# Patient Record
Sex: Female | Born: 1955 | Race: Asian | Hispanic: Refuse to answer | Marital: Married | Smoking: Never smoker
Health system: Southern US, Community
[De-identification: ages and names within clinical notes are randomized; demographics above are authoritative.]

## PROBLEM LIST (undated history)

## (undated) DIAGNOSIS — E119 Type 2 diabetes mellitus without complications: Secondary | ICD-10-CM

## (undated) HISTORY — PX: COLONOSCOPY: SHX174

## (undated) HISTORY — PX: CATARACT EXTRACTION: SUR2

## (undated) HISTORY — PX: TRIGGER FINGER RELEASE: SHX641

## (undated) HISTORY — DX: Type 2 diabetes mellitus without complications: E11.9

---

## 2019-10-28 LAB — HM COLONOSCOPY

## 2020-11-26 LAB — HEMOGLOBIN A1C: Hemoglobin A1C: 7.9

## 2020-12-17 ENCOUNTER — Other Ambulatory Visit: Payer: Self-pay | Admitting: Internal Medicine

## 2020-12-17 ENCOUNTER — Ambulatory Visit: Payer: BC Managed Care – PPO | Admitting: Internal Medicine

## 2020-12-17 ENCOUNTER — Ambulatory Visit (INDEPENDENT_AMBULATORY_CARE_PROVIDER_SITE_OTHER): Payer: BC Managed Care – PPO

## 2020-12-17 ENCOUNTER — Other Ambulatory Visit: Payer: Self-pay

## 2020-12-17 ENCOUNTER — Encounter: Payer: Self-pay | Admitting: Internal Medicine

## 2020-12-17 VITALS — BP 158/70 | HR 91 | Temp 99.4°F | Ht 63.25 in | Wt 153.0 lb

## 2020-12-17 DIAGNOSIS — R059 Cough, unspecified: Secondary | ICD-10-CM

## 2020-12-17 DIAGNOSIS — R0602 Shortness of breath: Secondary | ICD-10-CM | POA: Diagnosis not present

## 2020-12-17 DIAGNOSIS — E119 Type 2 diabetes mellitus without complications: Secondary | ICD-10-CM | POA: Diagnosis not present

## 2020-12-17 DIAGNOSIS — E118 Type 2 diabetes mellitus with unspecified complications: Secondary | ICD-10-CM | POA: Diagnosis not present

## 2020-12-17 DIAGNOSIS — Z794 Long term (current) use of insulin: Secondary | ICD-10-CM

## 2020-12-17 DIAGNOSIS — I10 Essential (primary) hypertension: Secondary | ICD-10-CM | POA: Diagnosis not present

## 2020-12-17 MED ORDER — OLMESARTAN MEDOXOMIL 40 MG PO TABS: 40.0000 mg | ORAL_TABLET | Freq: Every day | ORAL | 1 refills | Status: DC

## 2020-12-17 MED ORDER — ONETOUCH VERIO VI STRP: 1.0000 | ORAL_STRIP | Freq: Three times a day (TID) | 1 refills | Status: DC

## 2020-12-17 MED ORDER — INSULIN LISPRO (1 UNIT DIAL) 100 UNIT/ML (KWIKPEN): 8.0000 [IU] | PEN_INJECTOR | Freq: Three times a day (TID) | SUBCUTANEOUS | 1 refills | Status: DC

## 2020-12-17 MED ORDER — METFORMIN HCL ER 500 MG PO TB24: 1000.0000 mg | ORAL_TABLET | Freq: Every day | ORAL | 1 refills | Status: DC

## 2020-12-17 MED ORDER — DEXCOM G6 SENSOR MISC: 1.0000 | Freq: Every day | 5 refills | Status: DC

## 2020-12-17 MED ORDER — DEXCOM G6 TRANSMITTER MISC: 1.0000 | Freq: Every day | 5 refills | Status: DC

## 2020-12-17 MED ORDER — DEXCOM G6 RECEIVER DEVI: 1.0000 | Freq: Every day | 5 refills | Status: DC

## 2020-12-17 NOTE — Patient Instructions (Signed)

## 2020-12-17 NOTE — Progress Notes (Addendum)
Subjective:  Patient ID: Catherine Murphy, female    DOB: [birth year]  Age: 65 y.o. MRN: [id number]  CC: New Patient (Initial Visit), Hypertension, and Diabetes  This visit occurred during the SARS-CoV-2 public health emergency.  Safety protocols were in place, including screening questions prior to the visit, additional usage of staff PPE, and extensive cleaning of exam room while observing appropriate contact time as indicated for disinfecting solutions.    HPI Catherine Murphy presents for f/up and to establish.  She recently moved from [facility] to [place].  She has no records with her yet.  She has a history of DM2 and is insulin-dependent.  She wants to see an endocrinologist to see if she could start using an insulin pump.  She denies polys.  She is active and denies any recent episodes of chest pain, shortness of breath, palpitations, edema, or fatigue.  She complains of a 2-week history of nonproductive cough.  The cough is improving.  She denies shortness of breath, wheezing, fever, chills, or night sweats.  History Catherine Murphy has a past medical history of Diabetes mellitus without complication ([place]).   She has a past surgical history that includes Cataract extraction (Right); Colonoscopy; and Trigger finger release.   Her family history includes Cancer in her father and mother; Glaucoma in her father; Ovarian cancer in her mother.She reports that she has never smoked. She has never used smokeless tobacco. She reports previous alcohol use. She reports that she does not use drugs.  Outpatient Medications Prior to Visit  Medication Sig Dispense Refill  . azelastine (ASTELIN) 0.1 % nasal spray Place 2 sprays into both nostrils as needed for rhinitis. Use in each nostril as directed    . Blood Glucose Monitoring Suppl (ONETOUCH VERIO) w/Device KIT by Does not apply route.    . insulin glargine (LANTUS SOLOSTAR) 100 UNIT/ML Solostar Pen Inject 18 Units into the skin daily.    . Insulin  Pen Needle (B-D UF III MINI PEN NEEDLES) 31G X 5 MM MISC by Does not apply route.    Catherine Murphy Delica Lancets 62E MISC by Does not apply route.    . pioglitazone (ACTOS) 15 MG tablet Take 15 mg by mouth daily.    . simvastatin (ZOCOR) 10 MG tablet Take 10 mg by mouth at bedtime.    Marland Kitchen glucose blood test strip 1 each by Other route as needed for other. One touch verio test strips    . insulin lispro (HUMALOG KWIKPEN) 100 UNIT/ML KwikPen Inject 6 Units into the skin 3 (three) times daily. 6 units in the morning and 8 units at night    . losartan (COZAAR) 50 MG tablet Take 50 mg by mouth daily.    . metFORMIN (GLUCOPHAGE-XR) 500 MG 24 hr tablet Take 500 mg by mouth daily with breakfast.     No facility-administered medications prior to visit.    ROS Review of Systems  Constitutional: Negative for appetite change, chills, fatigue and fever.  HENT: Negative.  Negative for sore throat and trouble swallowing.   Respiratory: Positive for cough.   Cardiovascular: Negative for chest pain, palpitations and leg swelling.  Gastrointestinal: Negative for abdominal pain, diarrhea and nausea.  Endocrine: Negative.   Genitourinary: Negative.  Negative for difficulty urinating, dysuria and hematuria.  Musculoskeletal: Negative.  Negative for arthralgias and myalgias.  Skin: Negative.  Negative for rash.  Neurological: Negative.  Negative for dizziness, weakness and light-headedness.  Hematological: Negative for adenopathy. Does not bruise/bleed easily.  Psychiatric/Behavioral:  Negative.     Objective:  BP (!) 158/70 (BP Location: Left Arm, Patient Position: Sitting, Cuff Size: Large)   Pulse 91   Temp 99.4 F (37.4 C) (Oral)   Ht 5' 3.25" (1.607 m)   Wt 153 lb (69.4 kg)   SpO2 96%   BMI 26.89 kg/m   Physical Exam Vitals reviewed.  Constitutional:      General: She is not in acute distress.    Appearance: Normal appearance. She is not ill-appearing, toxic-appearing or diaphoretic.  HENT:      Nose: Nose normal.     Mouth/Throat:     Mouth: Mucous membranes are moist.  Eyes:     General: No scleral icterus.    Conjunctiva/sclera: Conjunctivae normal.  Cardiovascular:     Rate and Rhythm: Normal rate and regular rhythm.     Heart sounds: No murmur heard.     Comments: EKG- NSR, 76 bpm No LVH or Q waves No old EKG's for comparison Pulmonary:     Effort: Pulmonary effort is normal.     Breath sounds: No stridor. No wheezing, rhonchi or rales.  Abdominal:     General: Abdomen is flat. Bowel sounds are normal. There is no distension.     Palpations: Abdomen is soft. There is no hepatomegaly, splenomegaly or mass.     Tenderness: There is no abdominal tenderness.  Musculoskeletal:        General: Normal range of motion.     Cervical back: Neck supple.     Right lower leg: No edema.     Left lower leg: No edema.  Lymphadenopathy:     Cervical: No cervical adenopathy.  Skin:    General: Skin is warm and dry.  Neurological:     Mental Status: She is alert.     Lab Results  Component Value Date   HGBA1C 7.9 11/26/2020    DG Chest 2 View  Result Date: 12/18/2020 CLINICAL DATA:  Cough and shortness of breath EXAM: CHEST - 2 VIEW COMPARISON:  None. FINDINGS: Lungs hyperexpanded. Slight scarring left upper lobe. No edema or airspace opacity. Heart size and pulmonary vascularity are normal. No adenopathy. No bone lesions. IMPRESSION: Lungs hyperexpanded. Slight left upper lobe scarring. No edema or airspace opacity. Heart size within normal limits. Electronically Signed   By: Lowella Grip III M.D.   On: 12/18/2020 15:42     Assessment & Plan:   Catherine Murphy was seen today for new patient (initial visit), hypertension and diabetes.  Diagnoses and all orders for this visit:  Hypertension, unspecified type- Her blood pressure is not adequately well controlled.  I recommended that she upgrade to a more potent ARB.  I requested records from [place]. -     EKG 12-Lead -      olmesartan (BENICAR) 40 MG tablet; Take 1 tablet (40 mg total) by mouth daily.  Cough- Her chest x-ray is negative for mass or infiltrate.  This is likely a viral URI.  She will let me know if she develops any new or worsening symptoms. -     DG Chest 2 View; Future  Type II diabetes mellitus with manifestations (HCC) -     Discontinue: insulin lispro (HUMALOG KWIKPEN) 100 UNIT/ML KwikPen; Inject 8 Units into the skin 3 (three) times daily. 6 units in the morning and 8 units at night -     Continuous Blood Gluc Receiver ([place]) DEVI; 1 Act by Does not apply route daily. -  Continuous Blood Gluc Transmit (DEXCOM G6 TRANSMITTER) MISC; 1 Act by Does not apply route daily. -     Continuous Blood Gluc Sensor (DEXCOM G6 SENSOR) MISC; 1 Act by Does not apply route daily. -     metFORMIN (GLUCOPHAGE-XR) 500 MG 24 hr tablet; Take 2 tablets (1,000 mg total) by mouth daily with breakfast. -     olmesartan (BENICAR) 40 MG tablet; Take 1 tablet (40 mg total) by mouth daily. -     insulin lispro (HUMALOG KWIKPEN) 100 UNIT/ML KwikPen; Inject 8 Units into the skin 3 (three) times daily. -     Ambulatory referral to Endocrinology -     glucose blood (ONETOUCH VERIO) test strip; 1 each by Other route in the morning, at noon, and at bedtime. Use as instructed  Type 2 diabetes mellitus without complication, with long-term current use of insulin (HCC) -     Continuous Blood Gluc Receiver ([place]) DEVI; 1 Act by Does not apply route daily. -     Continuous Blood Gluc Transmit (DEXCOM G6 TRANSMITTER) MISC; 1 Act by Does not apply route daily. -     Continuous Blood Gluc Sensor (DEXCOM G6 SENSOR) MISC; 1 Act by Does not apply route daily. -     metFORMIN (GLUCOPHAGE-XR) 500 MG 24 hr tablet; Take 2 tablets (1,000 mg total) by mouth daily with breakfast. -     insulin lispro (HUMALOG KWIKPEN) 100 UNIT/ML KwikPen; Inject 8 Units into the skin 3 (three) times daily. -     Ambulatory  referral to Endocrinology -     glucose blood (ONETOUCH VERIO) test strip; 1 each by Other route in the morning, at noon, and at bedtime. Use as instructed   I have discontinued Perle C. Maheu "Helen"'s insulin lispro, losartan, and glucose blood. I have also changed her metFORMIN and insulin lispro. Additionally, I am having her start on Dexcom G6 Receiver, Dexcom G6 Transmitter, Dexcom G6 Sensor, olmesartan, and OneTouch Verio. Lastly, I am having her maintain her Lantus SoloStar, B-D UF III MINI PEN NEEDLES, simvastatin, pioglitazone, azelastine, OneTouch Delica Lancets 61P, and OneTouch Verio.  Meds ordered this encounter  Medications  . DISCONTD: insulin lispro (HUMALOG KWIKPEN) 100 UNIT/ML KwikPen    Sig: Inject 8 Units into the skin 3 (three) times daily. 6 units in the morning and 8 units at night    Dispense:  15 mL    Refill:  1  . Continuous Blood Gluc Receiver (DEXCOM G6 RECEIVER) DEVI    Sig: 1 Act by Does not apply route daily.    Dispense:  1 each    Refill:  5  . Continuous Blood Gluc Transmit (DEXCOM G6 TRANSMITTER) MISC    Sig: 1 Act by Does not apply route daily.    Dispense:  1 each    Refill:  5  . Continuous Blood Gluc Sensor (DEXCOM G6 SENSOR) MISC    Sig: 1 Act by Does not apply route daily.    Dispense:  1 each    Refill:  5  . metFORMIN (GLUCOPHAGE-XR) 500 MG 24 hr tablet    Sig: Take 2 tablets (1,000 mg total) by mouth daily with breakfast.    Dispense:  180 each    Refill:  1  . olmesartan (BENICAR) 40 MG tablet    Sig: Take 1 tablet (40 mg total) by mouth daily.    Dispense:  90 tablet    Refill:  1  . insulin lispro (HUMALOG KWIKPEN)  100 UNIT/ML KwikPen    Sig: Inject 8 Units into the skin 3 (three) times daily.    Dispense:  15 mL    Refill:  1  . glucose blood (ONETOUCH VERIO) test strip    Sig: 1 each by Other route in the morning, at noon, and at bedtime. Use as instructed    Dispense:  300 strip    Refill:  1     Follow-up: Return in about  3 months (around 03/19/2021).  Scarlette Calico, MD

## 2020-12-20 ENCOUNTER — Encounter: Payer: Self-pay | Admitting: Internal Medicine

## 2020-12-30 ENCOUNTER — Telehealth: Payer: Self-pay | Admitting: Internal Medicine

## 2020-12-30 DIAGNOSIS — E118 Type 2 diabetes mellitus with unspecified complications: Secondary | ICD-10-CM

## 2020-12-30 DIAGNOSIS — Z794 Long term (current) use of insulin: Secondary | ICD-10-CM

## 2020-12-30 DIAGNOSIS — E119 Type 2 diabetes mellitus without complications: Secondary | ICD-10-CM

## 2020-12-30 NOTE — Telephone Encounter (Signed)
[facility] is requesting a new prescription for Continuous Blood Gluc Sensor (DEXCOM G6 SENSOR) MISC They are needing a 3 month supply. Also, they are needing the most recent clinical notes. They said that they sent a fax today. It can be sent to [facility] - [place], [place] - [address]. Please advise   Phone: [phone] Ext: [phone]

## 2020-12-31 MED ORDER — DEXCOM G6 SENSOR MISC: 1.0000 | Freq: Every day | 5 refills | Status: DC

## 2020-12-31 NOTE — Addendum Note (Signed)
Addended by: Darryll Capers on: 12/31/2020 01:11 PM   Modules accepted: Orders

## 2021-01-05 NOTE — Telephone Encounter (Signed)
Most recent note has been faxed

## 2021-01-05 NOTE — Telephone Encounter (Signed)
[facility] called and said that they are requesting the most recent office notes. Please advise    Fax: [phone] Phone: [phone]

## 2021-01-14 DIAGNOSIS — E118 Type 2 diabetes mellitus with unspecified complications: Secondary | ICD-10-CM | POA: Diagnosis not present

## 2021-01-22 ENCOUNTER — Telehealth: Payer: Self-pay | Admitting: Internal Medicine

## 2021-01-22 ENCOUNTER — Other Ambulatory Visit: Payer: Self-pay | Admitting: Internal Medicine

## 2021-01-22 DIAGNOSIS — R059 Cough, unspecified: Secondary | ICD-10-CM

## 2021-01-22 NOTE — Telephone Encounter (Signed)
Pt has been informed and expressed understanding.  

## 2021-01-22 NOTE — Telephone Encounter (Signed)
    Patient called and said that she is still coughing. She said that she is taking Mucinex DM and it is not helping. She said that it is her allergies. She was wondering what she could do. She can be reached at [phone]. Please advise

## 2021-01-27 ENCOUNTER — Other Ambulatory Visit: Payer: Self-pay | Admitting: Internal Medicine

## 2021-01-27 DIAGNOSIS — E118 Type 2 diabetes mellitus with unspecified complications: Secondary | ICD-10-CM

## 2021-01-27 DIAGNOSIS — E119 Type 2 diabetes mellitus without complications: Secondary | ICD-10-CM

## 2021-01-27 MED ORDER — DEXCOM G6 SENSOR MISC: 1.0000 | Freq: Every day | 1 refills | Status: DC

## 2021-01-27 MED ORDER — DEXCOM G6 TRANSMITTER MISC: 1.0000 | Freq: Every day | 1 refills | Status: DC

## 2021-01-27 MED ORDER — DEXCOM G6 RECEIVER DEVI: 2 refills | Status: DC

## 2021-01-27 NOTE — Telephone Encounter (Signed)
   Patient called and is requesting 180 day supply not 90. Please advise

## 2021-01-27 NOTE — Telephone Encounter (Signed)
   Patients husband called and is requesting a 90 day supply for Continuous Blood Gluc Sensor (DEXCOM G6 SENSOR) MISC and Continuous Blood Gluc Transmit (DEXCOM G6 TRANSMITTER) MISC and be sent to [facility] - [place], [place] - [address]. Please advise.

## 2021-02-02 DIAGNOSIS — E118 Type 2 diabetes mellitus with unspecified complications: Secondary | ICD-10-CM | POA: Diagnosis not present

## 2021-02-19 DIAGNOSIS — E118 Type 2 diabetes mellitus with unspecified complications: Secondary | ICD-10-CM | POA: Diagnosis not present

## 2021-03-03 ENCOUNTER — Encounter: Payer: Self-pay | Admitting: Pulmonary Disease

## 2021-03-03 ENCOUNTER — Other Ambulatory Visit: Payer: Self-pay

## 2021-03-03 ENCOUNTER — Ambulatory Visit: Payer: BC Managed Care – PPO | Admitting: Pulmonary Disease

## 2021-03-03 VITALS — BP 128/74 | HR 73 | Ht 63.5 in | Wt 152.0 lb

## 2021-03-03 DIAGNOSIS — R059 Cough, unspecified: Secondary | ICD-10-CM

## 2021-03-03 DIAGNOSIS — J4520 Mild intermittent asthma, uncomplicated: Secondary | ICD-10-CM | POA: Diagnosis not present

## 2021-03-03 MED ORDER — ANORO ELLIPTA 62.5-25 MCG/INH IN AEPB: 1.0000 | INHALATION_SPRAY | Freq: Every day | RESPIRATORY_TRACT | 0 refills | Status: DC

## 2021-03-03 MED ORDER — ANORO ELLIPTA 62.5-25 MCG/INH IN AEPB: 1.0000 | INHALATION_SPRAY | Freq: Every day | RESPIRATORY_TRACT | 6 refills | Status: DC | PRN

## 2021-03-03 MED ORDER — ALBUTEROL SULFATE HFA 108 (90 BASE) MCG/ACT IN AERS
2.0000 | INHALATION_SPRAY | Freq: Four times a day (QID) | RESPIRATORY_TRACT | 6 refills | Status: DC | PRN
Start: 2021-03-03 — End: 2022-04-28

## 2021-03-03 NOTE — Progress Notes (Signed)
Patient seen in the office today and instructed on use of Anoro.  Patient expressed understanding and demonstrated technique.  Emelia Loron [facility] 03/03/21

## 2021-03-03 NOTE — Patient Instructions (Addendum)
Use anoro ellipta 1 puf daily as needed for cough, wheezing, shortness of breath  Use albuterol inhaler 1-2 puffs every 4-6 hours as needed for cough, shortness of breath, wheezing or chest tightness  You can use astelin nasal spray as needed for nasal congestion and runny nose due to allergies  You can use over the counter allergy medicine such as Zyrtec or Allegra

## 2021-03-03 NOTE — Progress Notes (Signed)
Synopsis: Referred in August 2022 for cough by Scarlette Calico, MD  Subjective:   PATIENT ID: Catherine Murphy GENDER: female DOB: March 25, 1956, MRN: [id number]   HPI  Chief Complaint  Patient presents with   Consult    Referred by PCP for chronic cough. States she moved here from [place] about 2 months ago. After moving here, she developed a dry cough. Denied any wheezing or SOB associated with cough.     Catherine Murphy is a 65 year old woman, never smoker with history of diabetes mellitus type II who is referred to pulmonary clinic for cough.   She reports developing a cough at the beginning of May due to spring allergies as she reports the cough has resolved and is not bothering her at this time.  She recently moved from [place], [place] where she had no seasonal allergies.  She also reports nasal congestion and possible postnasal drainage when she had the cough.  The cough was worse at night.  The cough is nonproductive.  She did use her husband's Qvar inhaler with improvement of the cough.  She also reports cough symptoms and chest tightness when exposed to cigarette smoke, strong perfumes/colognes or scented candles.  Note reviewed from her primary care physician Dr. Ronnald Ramp on 12/17/20 with chest radiograph showing no opacities or infiltrates at that time. She has had cough since the beginning of May.   She denies any history of smoking and no secondhand smoke exposure. She denies any occupational dust or chemical exposures over the years.  Past Medical History:  Diagnosis Date   Diabetes mellitus without complication ([place])      Family History  Problem Relation Age of Onset   Glaucoma Father    Colon cancer Father    Ovarian cancer Mother      Social History   Socioeconomic History   Marital status: Married    Spouse name: Not on file   Number of children: Not on file   Years of education: Not on file   Highest education level: Not on file  Occupational History   Not on  file  Tobacco Use   Smoking status: Never   Smokeless tobacco: Never  Vaping Use   Vaping Use: Never used  Substance and Sexual Activity   Alcohol use: Not Currently   Drug use: Never   Sexual activity: Not on file  Other Topics Concern   Not on file  Social History Narrative   Not on file   Social Determinants of Health   Financial Resource Strain: Not on file  Food Insecurity: Not on file  Transportation Needs: Not on file  Physical Activity: Not on file  Stress: Not on file  Social Connections: Not on file  Intimate Partner Violence: Not on file     No Known Allergies   Outpatient Medications Prior to Visit  Medication Sig Dispense Refill   azelastine (ASTELIN) 0.1 % nasal spray Place 2 sprays into both nostrils as needed for rhinitis. Use in each nostril as directed     Blood Glucose Monitoring Suppl (ONETOUCH VERIO) w/Device KIT by Does not apply route.     Continuous Blood Gluc Receiver (DEXCOM G6 RECEIVER) DEVI 2 PER YEAR 3 each 2   Continuous Blood Gluc Sensor (DEXCOM G6 SENSOR) MISC 1 Act by Does not apply route daily. 3 each 1   Continuous Blood Gluc Transmit (DEXCOM G6 TRANSMITTER) MISC 1 Act by Does not apply route daily. 3 each 1   glucose blood (  ONETOUCH VERIO) test strip 1 each by Other route in the morning, at noon, and at bedtime. Use as instructed 300 strip 1   insulin glargine (LANTUS SOLOSTAR) 100 UNIT/ML Solostar Pen Inject 18 Units into the skin daily.     insulin lispro (HUMALOG KWIKPEN) 100 UNIT/ML KwikPen Inject 8 Units into the skin 3 (three) times daily. 15 mL 1   Insulin Pen Needle (B-D UF III MINI PEN NEEDLES) 31G X 5 MM MISC by Does not apply route.     metFORMIN (GLUCOPHAGE-XR) 500 MG 24 hr tablet Take 2 tablets (1,000 mg total) by mouth daily with breakfast. 180 each 1   olmesartan (BENICAR) 40 MG tablet Take 1 tablet (40 mg total) by mouth daily. 90 tablet 1   OneTouch Delica Lancets 62I MISC by Does not apply route.     pioglitazone (ACTOS)  15 MG tablet Take 15 mg by mouth daily.     simvastatin (ZOCOR) 10 MG tablet Take 10 mg by mouth at bedtime.     No facility-administered medications prior to visit.    Review of Systems  Constitutional:  Negative for chills, fever, malaise/fatigue and weight loss.  HENT:  Positive for congestion. Negative for sinus pain and sore throat.   Eyes: Negative.   Respiratory:  Positive for cough and shortness of breath. Negative for hemoptysis, sputum production and wheezing.   Cardiovascular:  Negative for chest pain, palpitations, orthopnea, claudication and leg swelling.  Gastrointestinal:  Negative for abdominal pain, heartburn, nausea and vomiting.  Genitourinary: Negative.   Musculoskeletal:  Negative for joint pain and myalgias.  Skin:  Negative for rash.  Neurological:  Negative for weakness.  Endo/Heme/Allergies: Negative.   Psychiatric/Behavioral: Negative.     Objective:   Vitals:   03/03/21 1332  BP: 128/74  Pulse: 73  SpO2: 97%  Weight: 152 lb (68.9 kg)  Height: 5' 3.5" (1.613 m)    Physical Exam Constitutional:      General: She is not in acute distress.    Appearance: She is not ill-appearing.  HENT:     Head: Normocephalic and atraumatic.  Eyes:     General: No scleral icterus.    Conjunctiva/sclera: Conjunctivae normal.     Pupils: Pupils are equal, round, and reactive to light.  Cardiovascular:     Rate and Rhythm: Normal rate and regular rhythm.     Pulses: Normal pulses.     Heart sounds: Normal heart sounds. No murmur heard. Pulmonary:     Effort: Pulmonary effort is normal.     Breath sounds: Normal breath sounds. No wheezing, rhonchi or rales.  Abdominal:     General: Bowel sounds are normal.     Palpations: Abdomen is soft.  Musculoskeletal:     Right lower leg: No edema.     Left lower leg: No edema.  Lymphadenopathy:     Cervical: No cervical adenopathy.  Skin:    General: Skin is warm and dry.  Neurological:     General: No focal deficit  present.     Mental Status: She is alert.  Psychiatric:        Mood and Affect: Mood normal.        Behavior: Behavior normal.        Thought Content: Thought content normal.        Judgment: Judgment normal.    CBC No results found for: WBC, RBC, HGB, HCT, PLT, MCV, MCH, MCHC, RDW, LYMPHSABS, MONOABS, EOSABS, BASOSABS   Chest imaging: CXR  12/17/20 Lungs hyperexpanded. Slight scarring left upper lobe. No edema or airspace opacity. Heart size and pulmonary vascularity are normal. No adenopathy. No bone lesions  PFT: No flowsheet data found.    Assessment & Plan:   Cough  Mild intermittent asthma without complication - Plan: umeclidinium-vilanterol (ANORO ELLIPTA) 62.5-25 MCG/INH AEPB, albuterol (VENTOLIN HFA) 108 (90 Base) MCG/ACT inhaler  Discussion: Catherine Murphy is a 65 year old woman, never smoker with history of diabetes mellitus type II who is referred to pulmonary clinic for cough.   She appears to have mild intermittent asthma with the following aggravating factors which include spring and fall allergens, cigarette smoke and strong scents such as colognes/perfumes.  Given her history of glaucoma we will avoid inhaled corticosteroid therapy.  She is to use an anoro Ellipta 1 puff daily as needed for cough, wheezing, chest tightness or shortness of breath. She will likely need to use this inhaler scheduled during the spring and fall months with as needed albuterol.   She can start over-the-counter allergy medicine such as Zyrtec or Allegra for her seasonal allergies.  She can continue Astelin nasal spray as needed for nasal congestion.  Again we will avoid fluticasone nasal spray given her history of glaucoma.  If she develops progressive symptoms we will then move forward with pulmonary function testing and imaging if needed.   Follow up in 6 months.  Freda Jackson, MD [facility] Office: [phone]   Current Outpatient Medications:     albuterol (VENTOLIN HFA) 108 (90 Base) MCG/ACT inhaler, Inhale 2 puffs into the lungs every 6 (six) hours as needed for wheezing or shortness of breath., Disp: 8 g, Rfl: 6   azelastine (ASTELIN) 0.1 % nasal spray, Place 2 sprays into both nostrils as needed for rhinitis. Use in each nostril as directed, Disp: , Rfl:    Blood Glucose Monitoring Suppl (ONETOUCH VERIO) w/Device KIT, by Does not apply route., Disp: , Rfl:    Continuous Blood Gluc Receiver (DEXCOM G6 RECEIVER) DEVI, 2 PER YEAR, Disp: 3 each, Rfl: 2   Continuous Blood Gluc Sensor (DEXCOM G6 SENSOR) MISC, 1 Act by Does not apply route daily., Disp: 3 each, Rfl: 1   Continuous Blood Gluc Transmit (DEXCOM G6 TRANSMITTER) MISC, 1 Act by Does not apply route daily., Disp: 3 each, Rfl: 1   glucose blood (ONETOUCH VERIO) test strip, 1 each by Other route in the morning, at noon, and at bedtime. Use as instructed, Disp: 300 strip, Rfl: 1   insulin glargine (LANTUS SOLOSTAR) 100 UNIT/ML Solostar Pen, Inject 18 Units into the skin daily., Disp: , Rfl:    insulin lispro (HUMALOG KWIKPEN) 100 UNIT/ML KwikPen, Inject 8 Units into the skin 3 (three) times daily., Disp: 15 mL, Rfl: 1   Insulin Pen Needle (B-D UF III MINI PEN NEEDLES) 31G X 5 MM MISC, by Does not apply route., Disp: , Rfl:    metFORMIN (GLUCOPHAGE-XR) 500 MG 24 hr tablet, Take 2 tablets (1,000 mg total) by mouth daily with breakfast., Disp: 180 each, Rfl: 1   olmesartan (BENICAR) 40 MG tablet, Take 1 tablet (40 mg total) by mouth daily., Disp: 90 tablet, Rfl: 1   OneTouch Delica Lancets 32K MISC, by Does not apply route., Disp: , Rfl:    pioglitazone (ACTOS) 15 MG tablet, Take 15 mg by mouth daily., Disp: , Rfl:    simvastatin (ZOCOR) 10 MG tablet, Take 10 mg by mouth at bedtime., Disp: , Rfl:    umeclidinium-vilanterol (ANORO  ELLIPTA) 62.5-25 MCG/INH AEPB, Inhale 1 puff into the lungs daily as needed., Disp: 60 each, Rfl: 6   umeclidinium-vilanterol (ANORO ELLIPTA) 62.5-25 MCG/INH AEPB,  Inhale 1 puff into the lungs daily., Disp: 1 each, Rfl: 0

## 2021-03-09 ENCOUNTER — Telehealth: Payer: Self-pay | Admitting: Internal Medicine

## 2021-03-09 ENCOUNTER — Other Ambulatory Visit: Payer: Self-pay | Admitting: Internal Medicine

## 2021-03-09 DIAGNOSIS — Z1231 Encounter for screening mammogram for malignant neoplasm of breast: Secondary | ICD-10-CM

## 2021-03-09 NOTE — Telephone Encounter (Signed)
Patient is requesting referral for routine mammogram  Callback [phone]

## 2021-03-23 ENCOUNTER — Other Ambulatory Visit: Payer: Self-pay

## 2021-03-23 ENCOUNTER — Ambulatory Visit: Payer: BC Managed Care – PPO | Admitting: Internal Medicine

## 2021-03-23 ENCOUNTER — Encounter: Payer: Self-pay | Admitting: Endocrinology

## 2021-03-23 ENCOUNTER — Ambulatory Visit: Payer: BC Managed Care – PPO | Admitting: Endocrinology

## 2021-03-23 VITALS — BP 156/64 | HR 78 | Ht 63.5 in | Wt 157.4 lb

## 2021-03-23 DIAGNOSIS — E1165 Type 2 diabetes mellitus with hyperglycemia: Secondary | ICD-10-CM

## 2021-03-23 DIAGNOSIS — E118 Type 2 diabetes mellitus with unspecified complications: Secondary | ICD-10-CM

## 2021-03-23 DIAGNOSIS — Z794 Long term (current) use of insulin: Secondary | ICD-10-CM | POA: Diagnosis not present

## 2021-03-23 DIAGNOSIS — E119 Type 2 diabetes mellitus without complications: Secondary | ICD-10-CM

## 2021-03-23 LAB — POCT GLYCOSYLATED HEMOGLOBIN (HGB A1C): Hemoglobin A1C: 7.5 % — AB (ref 4.0–5.6)

## 2021-03-23 MED ORDER — LANTUS SOLOSTAR 100 UNIT/ML ~~LOC~~ SOPN: 14.0000 [IU] | PEN_INJECTOR | Freq: Every day | SUBCUTANEOUS | 3 refills | Status: DC

## 2021-03-23 MED ORDER — INSULIN LISPRO (1 UNIT DIAL) 100 UNIT/ML (KWIKPEN): PEN_INJECTOR | SUBCUTANEOUS | 1 refills | Status: DC

## 2021-03-23 NOTE — Patient Instructions (Addendum)
Your blood pressure is high today.  Please see your primary care provider soon, to have it rechecked good diet and exercise significantly improve the control of your diabetes.  please let me know if you wish to be referred to a dietician.  high blood sugar is very risky to your health.  you should see an eye doctor and dentist every year.  It is very important to get all recommended vaccinations.  Controlling your blood pressure and cholesterol drastically reduces the damage diabetes does to your body.  Those who smoke should quit.  Please discuss these with your doctor.  check your blood sugar twice a day.  vary the time of day when you check, between before the 3 meals, and at bedtime.  also check if you have symptoms of your blood sugar being too high or too low.  please keep a record of the readings and bring it to your next appointment here (or you can bring the meter itself).  You can write it on any piece of paper.  please call us sooner if your blood sugar goes below 70, or if most of your readings are over 200.   Please change the insulins to the numbers listed below. Please see a diabetes education specialist, to consider a pump.  Please come back for a follow-up appointment in 2 months.    ????????????????????????????? ????????????????????????????????????????????????????????????????????????????????? ????????????????????????????????????????????????? ?????????????????????????????????????????????????????????????????????????????????????????????????????? 70????????????? 200????????? ????????????? ????????????????? ?? 2 ????????????

## 2021-03-23 NOTE — Progress Notes (Signed)
Subjective:    Patient ID: Catherine Murphy, female    DOB: 06-25-1956, 65 y.o.   MRN: [id number]  HPI pt is referred by Dr Ronnald Ramp, for diabetes.  Pt states DM was dx'ed in 1993 (she had GDM in 1991); she is unaware of any chronic complications; she has been on insulin since dx; pt says her diet and exercise are good; she has never had pancreatitis, pancreatic surgery, severe hypoglycemia or DKA.  She takes Lantus, 15 units QHS, and Novolog 3 times a day (just before each meal), 6-8-8 units.  She has mild hypoglycemia QOD, in the middle of the night, despite the reduction of Lantus.  I reviewed continuous glucose monitor data.  Glucose varies from 80-330.  It is in general highest at [place].  Otherwise, There is no trend throughout the day. Past Medical History:  Diagnosis Date   Diabetes mellitus without complication ([place])     Past Surgical History:  Procedure Laterality Date   CATARACT EXTRACTION Right    COLONOSCOPY     TRIGGER FINGER RELEASE      Social History   Socioeconomic History   Marital status: Married    Spouse name: Not on file   Number of children: Not on file   Years of education: Not on file   Highest education level: Not on file  Occupational History   Not on file  Tobacco Use   Smoking status: Never   Smokeless tobacco: Never  Vaping Use   Vaping Use: Never used  Substance and Sexual Activity   Alcohol use: Not Currently   Drug use: Never   Sexual activity: Not on file  Other Topics Concern   Not on file  Social History Narrative   Not on file   Social Determinants of Health   Financial Resource Strain: Not on file  Food Insecurity: Not on file  Transportation Needs: Not on file  Physical Activity: Not on file  Stress: Not on file  Social Connections: Not on file  Intimate Partner Violence: Not on file    Current Outpatient Medications on File Prior to Visit  Medication Sig Dispense Refill   albuterol (VENTOLIN HFA) 108 (90 Base) MCG/ACT inhaler  Inhale 2 puffs into the lungs every 6 (six) hours as needed for wheezing or shortness of breath. 8 g 6   azelastine (ASTELIN) 0.1 % nasal spray Place 2 sprays into both nostrils as needed for rhinitis. Use in each nostril as directed     Blood Glucose Monitoring Suppl (ONETOUCH VERIO) w/Device KIT by Does not apply route.     Continuous Blood Gluc Receiver (DEXCOM G6 RECEIVER) DEVI 2 PER YEAR 3 each 2   Continuous Blood Gluc Sensor (DEXCOM G6 SENSOR) MISC 1 Act by Does not apply route daily. 3 each 1   Continuous Blood Gluc Transmit (DEXCOM G6 TRANSMITTER) MISC 1 Act by Does not apply route daily. 3 each 1   glucose blood (ONETOUCH VERIO) test strip 1 each by Other route in the morning, at noon, and at bedtime. Use as instructed 300 strip 1   Insulin Pen Needle (B-D UF III MINI PEN NEEDLES) 31G X 5 MM MISC by Does not apply route.     metFORMIN (GLUCOPHAGE-XR) 500 MG 24 hr tablet Take 2 tablets (1,000 mg total) by mouth daily with breakfast. 180 each 1   olmesartan (BENICAR) 40 MG tablet Take 1 tablet (40 mg total) by mouth daily. 90 tablet 1   OneTouch Delica Lancets 24M MISC  by Does not apply route.     pioglitazone (ACTOS) 15 MG tablet Take 15 mg by mouth daily.     simvastatin (ZOCOR) 10 MG tablet Take 10 mg by mouth at bedtime.     umeclidinium-vilanterol (ANORO ELLIPTA) 62.5-25 MCG/INH AEPB Inhale 1 puff into the lungs daily as needed. 60 each 6   umeclidinium-vilanterol (ANORO ELLIPTA) 62.5-25 MCG/INH AEPB Inhale 1 puff into the lungs daily. 1 each 0   No current facility-administered medications on file prior to visit.    No Known Allergies  Family History  Problem Relation Age of Onset   Ovarian cancer Mother    Glaucoma Father    Colon cancer Father    Diabetes Neg Hx     BP (!) 156/64 (BP Location: Left Arm, Patient Position: Sitting, Cuff Size: Large)   Pulse 78   Ht 5' 3.5" (1.613 m)   Wt 157 lb 6.4 oz (71.4 kg)   SpO2 96%   BMI 27.44 kg/m     Review of  Systems denies sob, n/v, and depression.  She has weight gain.      Objective:   Physical Exam Pulses: dorsalis pedis intact bilat.   MSK: no deformity of the feet CV: no leg edema Skin:  no ulcer on the feet.  normal color and temp on the feet. Neuro: sensation is intact to touch on the feet.    A1c=7.5%  I have reviewed outside records, and summarized: Pt was noted to have elevated A1c, and referred here.  Pump rx was considered.     Assessment & Plan:  Insulin-requiring type 2 DM: uncontrolled   Patient Instructions  Your blood pressure is high today.  Please see your primary care provider soon, to have it rechecked good diet and exercise significantly improve the control of your diabetes.  please let me know if you wish to be referred to a dietician.  high blood sugar is very risky to your health.  you should see an eye doctor and dentist every year.  It is very important to get all recommended vaccinations.  Controlling your blood pressure and cholesterol drastically reduces the damage diabetes does to your body.  Those who smoke should quit.  Please discuss these with your doctor.  check your blood sugar twice a day.  vary the time of day when you check, between before the 3 meals, and at bedtime.  also check if you have symptoms of your blood sugar being too high or too low.  please keep a record of the readings and bring it to your next appointment here (or you can bring the meter itself).  You can write it on any piece of paper.  please call us sooner if your blood sugar goes below 70, or if most of your readings are over 200.   Please change the insulins to the numbers listed below. Please see a diabetes education specialist, to consider a pump.  Please come back for a follow-up appointment in 2 months.     ????????????????????????????? ????????????????????????????????????????????????????????????????????????????????? ????????????????????????????????????????????????? ?????????????????????????????????????????????????????????????????????????????????????????????????????? 70????????????? 200????????? ????????????? ????????????????? ?? 2 ????????????

## 2021-03-24 ENCOUNTER — Encounter: Payer: Self-pay | Admitting: Internal Medicine

## 2021-03-24 ENCOUNTER — Ambulatory Visit: Payer: BC Managed Care – PPO | Admitting: Internal Medicine

## 2021-03-24 VITALS — BP 138/66 | HR 76 | Temp 98.5°F | Resp 16 | Ht 63.5 in | Wt 158.2 lb

## 2021-03-24 DIAGNOSIS — Z0000 Encounter for general adult medical examination without abnormal findings: Secondary | ICD-10-CM | POA: Diagnosis not present

## 2021-03-24 DIAGNOSIS — E785 Hyperlipidemia, unspecified: Secondary | ICD-10-CM | POA: Diagnosis not present

## 2021-03-24 DIAGNOSIS — Z23 Encounter for immunization: Secondary | ICD-10-CM | POA: Diagnosis not present

## 2021-03-24 DIAGNOSIS — Z1231 Encounter for screening mammogram for malignant neoplasm of breast: Secondary | ICD-10-CM | POA: Diagnosis not present

## 2021-03-24 DIAGNOSIS — Z124 Encounter for screening for malignant neoplasm of cervix: Secondary | ICD-10-CM

## 2021-03-24 DIAGNOSIS — Z1159 Encounter for screening for other viral diseases: Secondary | ICD-10-CM | POA: Diagnosis not present

## 2021-03-24 DIAGNOSIS — Z794 Long term (current) use of insulin: Secondary | ICD-10-CM | POA: Insufficient documentation

## 2021-03-24 DIAGNOSIS — E118 Type 2 diabetes mellitus with unspecified complications: Secondary | ICD-10-CM | POA: Diagnosis not present

## 2021-03-24 DIAGNOSIS — Z0001 Encounter for general adult medical examination with abnormal findings: Secondary | ICD-10-CM | POA: Insufficient documentation

## 2021-03-24 DIAGNOSIS — I10 Essential (primary) hypertension: Secondary | ICD-10-CM | POA: Diagnosis not present

## 2021-03-24 LAB — LIPID PANEL
Cholesterol: 185 mg/dL (ref 0–200)
HDL: 75.9 mg/dL (ref >39.00)
LDL Cholesterol: 98 mg/dL (ref 0–99)
NonHDL: 109.59
Total CHOL/HDL Ratio: 2
Triglycerides: 60 mg/dL (ref 0.0–149.0)
VLDL: 12 mg/dL (ref 0.0–40.0)

## 2021-03-24 LAB — BASIC METABOLIC PANEL
BUN: 21 mg/dL (ref 6–23)
CO2: 25 mEq/L (ref 19–32)
Calcium: 9.4 mg/dL (ref 8.4–10.5)
Chloride: 104 mEq/L (ref 96–112)
Creatinine, Ser: 0.67 mg/dL (ref 0.40–1.20)
GFR: 92.83 mL/min (ref >60.00)
Glucose, Bld: 143 mg/dL — ABNORMAL HIGH (ref 70–99)
Potassium: 4.2 mEq/L (ref 3.5–5.1)
Sodium: 138 mEq/L (ref 135–145)

## 2021-03-24 LAB — HEPATIC FUNCTION PANEL
ALT: 14 U/L (ref 0–35)
AST: 22 U/L (ref 0–37)
Albumin: 4 g/dL (ref 3.5–5.2)
Alkaline Phosphatase: 48 U/L (ref 39–117)
Bilirubin, Direct: 0.1 mg/dL (ref 0.0–0.3)
Total Bilirubin: 0.4 mg/dL (ref 0.2–1.2)
Total Protein: 7.4 g/dL (ref 6.0–8.3)

## 2021-03-24 LAB — URINALYSIS, ROUTINE W REFLEX MICROSCOPIC
Bilirubin Urine: NEGATIVE
Hgb urine dipstick: NEGATIVE
Ketones, ur: NEGATIVE
Leukocytes,Ua: NEGATIVE
Nitrite: NEGATIVE
RBC / HPF: NONE SEEN (ref 0–?)
Specific Gravity, Urine: 1.01 (ref 1.000–1.030)
Total Protein, Urine: NEGATIVE
Urine Glucose: NEGATIVE
Urobilinogen, UA: 0.2 (ref 0.0–1.0)
WBC, UA: NONE SEEN (ref 0–?)
pH: 6 (ref 5.0–8.0)

## 2021-03-24 LAB — CBC WITH DIFFERENTIAL/PLATELET
Basophils Absolute: 0 10*3/uL (ref 0.0–0.1)
Basophils Relative: 0.7 % (ref 0.0–3.0)
Eosinophils Absolute: 0.1 10*3/uL (ref 0.0–0.7)
Eosinophils Relative: 2.4 % (ref 0.0–5.0)
HCT: 39.7 % (ref 36.0–46.0)
Hemoglobin: 13 g/dL (ref 12.0–15.0)
Lymphocytes Relative: 24.4 % (ref 12.0–46.0)
Lymphs Abs: 1.2 10*3/uL (ref 0.7–4.0)
MCHC: 32.8 g/dL (ref 30.0–36.0)
MCV: 91.6 fl (ref 78.0–100.0)
Monocytes Absolute: 0.3 10*3/uL (ref 0.1–1.0)
Monocytes Relative: 7.1 % (ref 3.0–12.0)
Neutro Abs: 3.1 10*3/uL (ref 1.4–7.7)
Neutrophils Relative %: 65.4 % (ref 43.0–77.0)
Platelets: 229 10*3/uL (ref 150.0–400.0)
RBC: 4.33 Mil/uL (ref 3.87–5.11)
RDW: 12.9 % (ref 11.5–15.5)
WBC: 4.8 10*3/uL (ref 4.0–10.5)

## 2021-03-24 LAB — MICROALBUMIN / CREATININE URINE RATIO
Creatinine,U: 65.9 mg/dL
Microalb Creat Ratio: 1.1 mg/g (ref 0.0–30.0)
Microalb, Ur: <0.7 mg/dL (ref 0.0–1.9)

## 2021-03-24 LAB — TSH: TSH: 3.66 u[IU]/mL (ref 0.35–5.50)

## 2021-03-24 NOTE — Patient Instructions (Signed)
Health Maintenance, Female Adopting a healthy lifestyle and getting preventive care are important in promoting health and wellness. Ask your health care provider about: The right schedule for you to have regular tests and exams. Things you can do on your own to prevent diseases and keep yourself healthy. What should I know about diet, weight, and exercise? Eat a healthy diet  Eat a diet that includes plenty of vegetables, fruits, low-fat dairy products, and lean protein. Do not eat a lot of foods that are high in solid fats, added sugars, or sodium.  Maintain a healthy weight Body mass index (BMI) is used to identify weight problems. It estimates body fat based on height and weight. Your health care provider can help determineyour BMI and help you achieve or maintain a healthy weight. Get regular exercise Get regular exercise. This is one of the most important things you can do for your health. Most adults should: Exercise for at least 150 minutes each week. The exercise should increase your heart rate and make you sweat (moderate-intensity exercise). Do strengthening exercises at least twice a week. This is in addition to the moderate-intensity exercise. Spend less time sitting. Even light physical activity can be beneficial. Watch cholesterol and blood lipids Have your blood tested for lipids and cholesterol at 64 years of age, then havethis test every 5 years. Have your cholesterol levels checked more often if: Your lipid or cholesterol levels are high. You are older than 64 years of age. You are at high risk for heart disease. What should I know about cancer screening? Depending on your health history and family history, you may need to have cancer screening at various ages. This may include screening for: Breast cancer. Cervical cancer. Colorectal cancer. Skin cancer. Lung cancer. What should I know about heart disease, diabetes, and high blood pressure? Blood pressure and heart  disease High blood pressure causes heart disease and increases the risk of stroke. This is more likely to develop in people who have high blood pressure readings, are of African descent, or are overweight. Have your blood pressure checked: Every 3-5 years if you are 18-39 years of age. Every year if you are 40 years old or older. Diabetes Have regular diabetes screenings. This checks your fasting blood sugar level. Have the screening done: Once every three years after age 40 if you are at a normal weight and have a low risk for diabetes. More often and at a younger age if you are overweight or have a high risk for diabetes. What should I know about preventing infection? Hepatitis B If you have a higher risk for hepatitis B, you should be screened for this virus. Talk with your health care provider to find out if you are at risk forhepatitis B infection. Hepatitis C Testing is recommended for: Everyone born from 1945 through 1965. Anyone with known risk factors for hepatitis C. Sexually transmitted infections (STIs) Get screened for STIs, including gonorrhea and chlamydia, if: You are sexually active and are younger than 64 years of age. You are older than 64 years of age and your health care provider tells you that you are at risk for this type of infection. Your sexual activity has changed since you were last screened, and you are at increased risk for chlamydia or gonorrhea. Ask your health care provider if you are at risk. Ask your health care provider about whether you are at high risk for HIV. Your health care provider may recommend a prescription medicine to help   prevent HIV infection. If you choose to take medicine to prevent HIV, you should first get tested for HIV. You should then be tested every 3 months for as long as you are taking the medicine. Pregnancy If you are about to stop having your period (premenopausal) and you may become pregnant, seek counseling before you get  pregnant. Take 400 to 800 micrograms (mcg) of folic acid every day if you become pregnant. Ask for birth control (contraception) if you want to prevent pregnancy. Osteoporosis and menopause Osteoporosis is a disease in which the bones lose minerals and strength with aging. This can result in bone fractures. If you are 65 years old or older, or if you are at risk for osteoporosis and fractures, ask your health care provider if you should: Be screened for bone loss. Take a calcium or vitamin D supplement to lower your risk of fractures. Be given hormone replacement therapy (HRT) to treat symptoms of menopause. Follow these instructions at home: Lifestyle Do not use any products that contain nicotine or tobacco, such as cigarettes, e-cigarettes, and chewing tobacco. If you need help quitting, ask your health care provider. Do not use street drugs. Do not share needles. Ask your health care provider for help if you need support or information about quitting drugs. Alcohol use Do not drink alcohol if: Your health care provider tells you not to drink. You are pregnant, may be pregnant, or are planning to become pregnant. If you drink alcohol: Limit how much you use to 0-1 drink a day. Limit intake if you are breastfeeding. Be aware of how much alcohol is in your drink. In the [place] one drink equals one 12 oz bottle of beer (355 mL), one 5 oz glass of wine (148 mL), or one 1 oz glass of hard liquor (44 mL). General instructions Schedule regular health, dental, and eye exams. Stay current with your vaccines. Tell your health care provider if: You often feel depressed. You have ever been abused or do not feel safe at home. Summary Adopting a healthy lifestyle and getting preventive care are important in promoting health and wellness. Follow your health care provider's instructions about healthy diet, exercising, and getting tested or screened for diseases. Follow your health care provider's  instructions on monitoring your cholesterol and blood pressure. This information is not intended to replace advice given to you by your health care provider. Make sure you discuss any questions you have with your healthcare provider. Document Revised: 07/11/2018 Document Reviewed: 07/11/2018 Elsevier Patient Education  2022 [employer]  

## 2021-03-24 NOTE — Progress Notes (Signed)
Subjective:  Patient ID: Catherine Murphy, female    DOB: 10/11/1955  Age: 65 y.o. MRN: [id number]  CC: Annual Exam, Diabetes, and Hyperlipidemia  This visit occurred during the SARS-CoV-2 public health emergency.  Safety protocols were in place, including screening questions prior to the visit, additional usage of staff PPE, and extensive cleaning of exam room while observing appropriate contact time as indicated for disinfecting solutions.    HPI Catherine Murphy presents for a CPX and f/up -   Her cough has resolved.  She is active and denies any recent episodes of chest pain, shortness of breath, dyspnea on exertion, diaphoresis, edema, or fatigue.  Outpatient Medications Prior to Visit  Medication Sig Dispense Refill   albuterol (VENTOLIN HFA) 108 (90 Base) MCG/ACT inhaler Inhale 2 puffs into the lungs every 6 (six) hours as needed for wheezing or shortness of breath. 8 g 6   azelastine (ASTELIN) 0.1 % nasal spray Place 2 sprays into both nostrils as needed for rhinitis. Use in each nostril as directed     Blood Glucose Monitoring Suppl (ONETOUCH VERIO) w/Device KIT by Does not apply route.     Continuous Blood Gluc Receiver (DEXCOM G6 RECEIVER) DEVI 2 PER YEAR 3 each 2   Continuous Blood Gluc Sensor (DEXCOM G6 SENSOR) MISC 1 Act by Does not apply route daily. 3 each 1   Continuous Blood Gluc Transmit (DEXCOM G6 TRANSMITTER) MISC 1 Act by Does not apply route daily. 3 each 1   glucose blood (ONETOUCH VERIO) test strip 1 each by Other route in the morning, at noon, and at bedtime. Use as instructed 300 strip 1   insulin glargine (LANTUS SOLOSTAR) 100 UNIT/ML Solostar Pen Inject 14 Units into the skin at bedtime. 15 mL 3   insulin lispro (HUMALOG KWIKPEN) 100 UNIT/ML KwikPen 3 times a day (just before each meal), 6-10-8 units. 15 mL 1   Insulin Pen Needle (B-D UF III MINI PEN NEEDLES) 31G X 5 MM MISC by Does not apply route.     metFORMIN (GLUCOPHAGE-XR) 500 MG 24 hr tablet Take 2 tablets  (1,000 mg total) by mouth daily with breakfast. 180 each 1   olmesartan (BENICAR) 40 MG tablet Take 1 tablet (40 mg total) by mouth daily. 90 tablet 1   OneTouch Delica Lancets 72I MISC by Does not apply route.     pioglitazone (ACTOS) 15 MG tablet Take 15 mg by mouth daily.     umeclidinium-vilanterol (ANORO ELLIPTA) 62.5-25 MCG/INH AEPB Inhale 1 puff into the lungs daily as needed. 60 each 6   simvastatin (ZOCOR) 10 MG tablet Take 10 mg by mouth at bedtime.     umeclidinium-vilanterol (ANORO ELLIPTA) 62.5-25 MCG/INH AEPB Inhale 1 puff into the lungs daily. 1 each 0   No facility-administered medications prior to visit.    ROS Review of Systems  Constitutional:  Negative for diaphoresis and fatigue.  HENT: Negative.    Eyes: Negative.   Respiratory:  Negative for cough, chest tightness, shortness of breath and wheezing.   Cardiovascular:  Negative for chest pain, palpitations and leg swelling.  Gastrointestinal:  Negative for abdominal pain, constipation, diarrhea, nausea and vomiting.  Endocrine: Negative.   Genitourinary: Negative.  Negative for difficulty urinating.  Musculoskeletal:  Negative for arthralgias and myalgias.  Skin: Negative.  Negative for color change and pallor.  Neurological:  Negative for dizziness, weakness, light-headedness and headaches.  Hematological:  Negative for adenopathy. Does not bruise/bleed easily.  Psychiatric/Behavioral: Negative.  Objective:  BP 138/66 (BP Location: Left Arm, Patient Position: Sitting, Cuff Size: Large)   Pulse 76   Temp 98.5 F (36.9 C) (Oral)   Resp 16   Ht 5' 3.5" (1.613 m)   Wt 158 lb 3.2 oz (71.8 kg)   SpO2 98%   BMI 27.58 kg/m   BP Readings from Last 3 Encounters:  03/24/21 138/66  03/23/21 (!) 156/64  03/03/21 128/74    Wt Readings from Last 3 Encounters:  03/24/21 158 lb 3.2 oz (71.8 kg)  03/23/21 157 lb 6.4 oz (71.4 kg)  03/03/21 152 lb (68.9 kg)    Physical Exam Vitals reviewed.  HENT:     Nose:  Nose normal.     Mouth/Throat:     Mouth: Mucous membranes are moist.  Eyes:     Conjunctiva/sclera: Conjunctivae normal.  Cardiovascular:     Rate and Rhythm: Normal rate and regular rhythm.     Heart sounds: No murmur heard. Pulmonary:     Effort: Pulmonary effort is normal.     Breath sounds: No stridor. No wheezing, rhonchi or rales.  Abdominal:     General: Abdomen is flat.     Palpations: There is no mass.     Tenderness: There is no abdominal tenderness. There is no guarding.     Hernia: No hernia is present.  Musculoskeletal:        General: Normal range of motion.     Cervical back: Neck supple.     Right lower leg: No edema.     Left lower leg: No edema.  Skin:    General: Skin is warm and dry.  Neurological:     General: No focal deficit present.     Mental Status: She is alert.  Psychiatric:        Mood and Affect: Mood normal.        Behavior: Behavior normal.    Lab Results  Component Value Date   WBC 4.8 03/24/2021   HGB 13.0 03/24/2021   HCT 39.7 03/24/2021   PLT 229.0 03/24/2021   GLUCOSE 143 (H) 03/24/2021   CHOL 185 03/24/2021   TRIG 60.0 03/24/2021   HDL 75.90 03/24/2021   LDLCALC 98 03/24/2021   ALT 14 03/24/2021   AST 22 03/24/2021   NA 138 03/24/2021   K 4.2 03/24/2021   CL 104 03/24/2021   CREATININE 0.67 03/24/2021   BUN 21 03/24/2021   CO2 25 03/24/2021   TSH 3.66 03/24/2021   HGBA1C 7.5 (A) 03/23/2021   MICROALBUR <0.7 03/24/2021    Patient was never admitted.  Assessment & Plan:   Catherine Murphy was seen today for annual exam, diabetes and hyperlipidemia.  Diagnoses and all orders for this visit:  Hypertension, unspecified type-her blood pressure is adequately well controlled. -     CBC with Differential/Platelet; Future -     Basic metabolic panel; Future -     Urinalysis, Routine w reflex microscopic; Future -     TSH; Future -     TSH -     Urinalysis, Routine w reflex microscopic -     Basic metabolic panel -     CBC with  Differential/Platelet  Type II diabetes mellitus with manifestations (HCC)-her blood sugar is adequately well controlled -     Basic metabolic panel; Future -     Microalbumin / creatinine urine ratio; Future -     Hepatic function panel; Future -     Ambulatory referral to Ophthalmology -  HM Diabetes Foot Exam -     Hepatic function panel -     Microalbumin / creatinine urine ratio -     Basic metabolic panel  Hyperlipidemia with target LDL less than 100- LDL goal achieved. Doing well on the statin  -     Lipid panel; Future -     Hepatic function panel; Future -     TSH; Future -     TSH -     Hepatic function panel -     Lipid panel -     simvastatin (ZOCOR) 10 MG tablet; Take 1 tablet (10 mg total) by mouth at bedtime.  Visit for screening mammogram -     MM DIGITAL SCREENING BILATERAL; Future  Screening for cervical cancer -     Ambulatory referral to Gynecology  Encounter for general adult medical examination with abnormal findings- Exam completed, labs reviewed, vaccines reviewed and updated, cancer screenings addressed, patient education was given. -     Hepatitis C antibody; Future -     HIV Antibody (routine testing w rflx); Future -     HIV Antibody (routine testing w rflx) -     Hepatitis C antibody  Need for hepatitis C screening test -     Hepatitis C antibody; Future -     Hepatitis C antibody  Need for vaccination -     Varicella-zoster vaccine IM (Shingrix) -     Pneumococcal conjugate vaccine 20-valent (Prevnar 20)  I have changed Catherine Murphy "Helen"'s simvastatin. I am also having her maintain her B-D UF III MINI PEN NEEDLES, pioglitazone, azelastine, OneTouch Delica Lancets 86H, OneTouch Verio, metFORMIN, olmesartan, OneTouch Verio, Dexcom G6 Receiver, Dexcom G6 Sensor, Dexcom G6 Transmitter, Anoro Ellipta, albuterol, Lantus SoloStar, and insulin lispro.  Meds ordered this encounter  Medications   simvastatin (ZOCOR) 10 MG tablet    Sig: Take 1  tablet (10 mg total) by mouth at bedtime.    Dispense:  90 tablet    Refill:  1      Follow-up: Return in about 3 months (around 06/24/2021).  Scarlette Calico, MD

## 2021-03-25 LAB — HEPATITIS C ANTIBODY
Hepatitis C Ab: NONREACTIVE
SIGNAL TO CUT-OFF: 0.01 (ref <1.00)

## 2021-03-25 LAB — HIV ANTIBODY (ROUTINE TESTING W REFLEX): HIV 1&2 Ab, 4th Generation: NONREACTIVE

## 2021-03-25 MED ORDER — SIMVASTATIN 10 MG PO TABS: 10.0000 mg | ORAL_TABLET | Freq: Every day | ORAL | 1 refills | Status: DC

## 2021-03-30 ENCOUNTER — Other Ambulatory Visit: Payer: Self-pay

## 2021-03-30 ENCOUNTER — Encounter: Payer: BC Managed Care – PPO | Attending: Endocrinology | Admitting: Nutrition

## 2021-03-30 ENCOUNTER — Other Ambulatory Visit: Payer: Self-pay | Admitting: Endocrinology

## 2021-03-30 DIAGNOSIS — E118 Type 2 diabetes mellitus with unspecified complications: Secondary | ICD-10-CM | POA: Insufficient documentation

## 2021-03-30 NOTE — Progress Notes (Signed)
Patient is here with her husband wanting to learn about insulin pump therapy.  We discussed how pumps work, and what is needed to wear in insulin pump.  She does not count carbs and is willing to learn to be able to wear this pump.  She is currently wearing a Dexcom, and she was shown the two pumps that use the Dexcom sensors.  She chose the [employer] pump, and paperwork was filled out and faxed to [employer] to start this process.  She was encouraged to go on line to to [employer] web site to review what this pump can do and what she is can expect.  They had no final questions.

## 2021-03-30 NOTE — Patient Instructions (Signed)
Go to [employer] web site for more information and to contact them if you do not hear back from them in 1 week. Make an appointment to learn carb counting.

## 2021-04-08 ENCOUNTER — Encounter: Payer: BC Managed Care – PPO | Attending: Endocrinology | Admitting: Dietician

## 2021-04-08 ENCOUNTER — Other Ambulatory Visit: Payer: Self-pay

## 2021-04-08 ENCOUNTER — Encounter: Payer: Self-pay | Admitting: Dietician

## 2021-04-08 DIAGNOSIS — E118 Type 2 diabetes mellitus with unspecified complications: Secondary | ICD-10-CM | POA: Insufficient documentation

## 2021-04-08 NOTE — Progress Notes (Signed)
Diabetes Self-Management Education  Visit Type: First/Initial  Appt. Start Time: 0910 Appt. End Time: 1010  04/08/2021  [name], identified by name and date of birth, is a 65 y.o. female with a diagnosis of Diabetes: Type 2.   ASSESSMENT Patient is here today with her husband Catherine Murphy.  They would like to learn carbohydrate counting in preparation for obtaining an insulin pump.  They are in process of obtaining a t-Slim insulin pump.    She has downloaded the [employer].  Patient of Dr. Everardo All. History includes Type 2 diabetes, HTN Medications noted to include Metformin , Actos, Lantus 13 units daily, and Humalog 6 units prior to breakfast and 8 units prior to dinner. A1C 7.5% 03/23/2021 CGM:  Dexcom G6  Patient lives with her husband. She is from [place].  Understands and speaks English very well.   Diabetes Self-Management Education - 04/08/21 1737       Visit Information   Visit Type First/Initial      Initial Visit   Diabetes Type Type 2    Are you taking your medications as prescribed? Yes      Psychosocial Assessment   Self-care barriers None    Self-management support Doctor's office;Family    Other persons present Patient;Spouse/SO    Patient Concerns Other (comment)   carb counting   Special Needs None    Preferred Learning Style No preference indicated    Learning Readiness Ready    How often do you need to have someone help you when you read instructions, pamphlets, or other written materials from your doctor or pharmacy? 1 - Never      Pre-Education Assessment   Patient understands the diabetes disease and treatment process. Demonstrates understanding / competency    Patient understands incorporating nutritional management into lifestyle. Needs Review    Patient undertands incorporating physical activity into lifestyle. Demonstrates understanding / competency    Patient understands using medications safely. Demonstrates understanding / competency     Patient understands monitoring blood glucose, interpreting and using results Demonstrates understanding / competency    Patient understands prevention, detection, and treatment of acute complications. Demonstrates understanding / competency    Patient understands prevention, detection, and treatment of chronic complications. Demonstrates understanding / competency    Patient understands how to develop strategies to address psychosocial issues. Demonstrates understanding / competency    Patient understands how to develop strategies to promote health/change behavior. Demonstrates understanding / competency      Complications   Last HgB A1C per patient/outside source 7.5 %   03/23/2021   How often do you check your blood sugar? > 4 times/day    Fasting Blood glucose range (mg/dL) 67-209;470-962    Postprandial Blood glucose range (mg/dL) 836-629;476-546      Dietary Intake   Breakfast boiled egg, [employer] toast with peanut butter    Lunch white rice, vegetables, beef, tofu OR beef stew OR [employer] sub    Dinner similar to lunch    Snack (evening) occasional chips    Beverage(s) water, coffee with cinnamon and 1/4 tsp honey, chia seeds with lemon, juice (to treat a low), occasional unsweetened tea      Exercise   Exercise Type Light (walking / raking leaves);Moderate (swimming / aerobic walking)   Zumba and walking   How many days per week to you exercise? 7    How many minutes per day do you exercise? 60    Total minutes per week of exercise 420  Patient Education   Previous Diabetes Education Yes (please comment)    Nutrition management  Carbohydrate counting    Monitoring Other (comment)   interpreting CGM after meals     Individualized Goals (developed by patient)   Physical Activity Exercise 5-7 days per week;60 minutes per day    Medications take my medication as prescribed    Monitoring  test my blood glucose as discussed    Reducing Risk examine blood glucose  patterns;increase portions of healthy fats      Post-Education Assessment   Patient understands the diabetes disease and treatment process. Demonstrates understanding / competency    Patient understands incorporating nutritional management into lifestyle. Needs Review    Patient undertands incorporating physical activity into lifestyle. Demonstrates understanding / competency    Patient understands using medications safely. Demonstrates understanding / competency    Patient understands monitoring blood glucose, interpreting and using results Demonstrates understanding / competency    Patient understands prevention, detection, and treatment of acute complications. Demonstrates understanding / competency    Patient understands prevention, detection, and treatment of chronic complications. Demonstrates understanding / competency    Patient understands how to develop strategies to address psychosocial issues. Demonstrates understanding / competency    Patient understands how to develop strategies to promote health/change behavior. Demonstrates understanding / competency      Outcomes   Expected Outcomes Demonstrated interest in learning. Expect positive outcomes    Future DMSE PRN    Program Status Completed             Individualized Plan for Diabetes Self-Management Training:   Learning Objective:  Patient will have a greater understanding of diabetes self-management. Patient education plan is to attend individual and/or group sessions per assessed needs and concerns.   Plan:   Patient Instructions  Practice carbohydrate counting.  Resources:  Calorie Clinical biochemist sites [employer] label  Expected Outcomes:  Demonstrated interest in learning. Expect positive outcomes  Education material provided: Food label handouts and Meal plan card  If problems or questions, patient to contact team via:  Phone  Future DSME appointment: PRN

## 2021-04-08 NOTE — Patient Instructions (Addendum)
Practice carbohydrate counting.  Resources:  Calorie Clinical biochemist sites [employer] label

## 2021-04-16 ENCOUNTER — Encounter: Payer: Self-pay | Admitting: Endocrinology

## 2021-04-16 DIAGNOSIS — H35033 Hypertensive retinopathy, bilateral: Secondary | ICD-10-CM | POA: Diagnosis not present

## 2021-04-16 LAB — HM DIABETES EYE EXAM

## 2021-04-27 ENCOUNTER — Telehealth: Payer: Self-pay | Admitting: Endocrinology

## 2021-04-27 DIAGNOSIS — E1165 Type 2 diabetes mellitus with hyperglycemia: Secondary | ICD-10-CM | POA: Diagnosis not present

## 2021-04-27 NOTE — Telephone Encounter (Signed)
INS doesn't cover Humalog, but pt can take Novolog, needs vials for pump. Pharmacy [facility] [address]. Pt contact info: [phone]

## 2021-05-03 NOTE — Telephone Encounter (Signed)
Pt following up on 9/27 call for insulin for her pump. See notes for that day.

## 2021-05-05 ENCOUNTER — Other Ambulatory Visit: Payer: Self-pay | Admitting: Endocrinology

## 2021-05-05 MED ORDER — INSULIN ASPART 100 UNIT/ML IJ SOLN: INTRAMUSCULAR | 3 refills | Status: DC

## 2021-05-08 ENCOUNTER — Encounter: Payer: Self-pay | Admitting: General Practice

## 2021-05-08 ENCOUNTER — Other Ambulatory Visit: Payer: Self-pay

## 2021-05-08 ENCOUNTER — Ambulatory Visit
Admission: EM | Admit: 2021-05-08 | Discharge: 2021-05-08 | Disposition: A | Discharge status: Home / Self Care | Payer: BC Managed Care – PPO | Attending: Internal Medicine | Admitting: Internal Medicine

## 2021-05-08 DIAGNOSIS — J029 Acute pharyngitis, unspecified: Secondary | ICD-10-CM | POA: Insufficient documentation

## 2021-05-08 DIAGNOSIS — Z20822 Contact with and (suspected) exposure to covid-19: Secondary | ICD-10-CM

## 2021-05-08 DIAGNOSIS — J069 Acute upper respiratory infection, unspecified: Secondary | ICD-10-CM | POA: Diagnosis not present

## 2021-05-08 LAB — POCT RAPID STREP A (OFFICE): Rapid Strep A Screen: NEGATIVE

## 2021-05-08 NOTE — Discharge Instructions (Addendum)
You likely having a viral upper respiratory infection. We recommended symptom control. I expect your symptoms to start improving in the next 1-2 weeks.   1. Take a daily allergy pill/anti-histamine like Zyrtec, Claritin, or Store brand consistently for 2 weeks  2. For congestion you may try an oral decongestant like Mucinex or coricidin HBP. You may also try intranasal flonase nasal spray or saline irrigations (neti pot, sinus cleanse)  3. For your sore throat you may try cepacol lozenges, salt water gargles, throat spray. Treatment of congestion may also help your sore throat.  4. For cough you may try Robitussen  5. Take Tylenol or Ibuprofen to help with pain/inflammation  6. Stay hydrated, drink plenty of fluids to keep throat coated and less irritated  Honey Tea For cough/sore throat try using a honey-based tea. Use 3 teaspoons of honey with juice squeezed from half lemon. Place shaved pieces of ginger into 1/2-1 cup of water and warm over stove top. Then mix the ingredients and repeat every 4 hours as needed.   Rapid strep test was negative.  Throat culture and COVID-19 viral swab are pending.  We will call if they are positive.

## 2021-05-08 NOTE — ED Triage Notes (Signed)
The throat hurts, painful 8/10 when she swallows, body aches, denies fever, lymph nodes appear swollen, pain extends to the ears Now, cbg is 227. Last night took 8 units of humalog insulin before meal. Feels dizzy, has not slept too good.

## 2021-05-08 NOTE — ED Provider Notes (Signed)
[facility]    CSN: [id number] Arrival date & time: 05/08/21  0810      History   Chief Complaint Chief Complaint  Patient presents with   Sore Throat    HPI Catherine Murphy is a 65 y.o. female.   Patient presents with sore throat, body aches, nasal congestion, bilateral ear pain that has been present for 2 days.  Patient has taken Mucinex, Advil, Tylenol with minimal improvement in symptoms.  Significant other had similar symptoms recently. Denies chest pain or shortness of breath.   Sore Throat   Past Medical History:  Diagnosis Date   Diabetes mellitus without complication [facility])     Patient Active Problem List   Diagnosis Date Noted   Hyperlipidemia with target LDL less than 100 03/24/2021   Screening for cervical cancer 03/24/2021   Encounter for general adult medical examination with abnormal findings 03/24/2021   Need for hepatitis C screening test 03/24/2021   Visit for screening mammogram 03/09/2021   Cough 12/17/2020   Hypertension 12/17/2020   Type II diabetes mellitus with manifestations ([place]) 12/17/2020    Past Surgical History:  Procedure Laterality Date   CATARACT EXTRACTION Right    COLONOSCOPY     TRIGGER FINGER RELEASE      OB History   No obstetric history on file.      Home Medications    Prior to Admission medications   Medication Sig Start Date End Date Taking? Authorizing Provider  azelastine (ASTELIN) 0.1 % nasal spray Place 2 sprays into both nostrils as needed for rhinitis. Use in each nostril as directed   Yes [provider]  metFORMIN (GLUCOPHAGE-XR) 500 MG 24 hr tablet Take 2 tablets (1,000 mg total) by mouth daily with breakfast. 12/17/20  Yes Janith Lima, MD  olmesartan (BENICAR) 40 MG tablet Take 1 tablet (40 mg total) by mouth daily. 12/17/20  Yes Janith Lima, MD  simvastatin (ZOCOR) 10 MG tablet Take 1 tablet (10 mg total) by mouth at bedtime. 03/25/21  Yes Janith Lima, MD  albuterol  (VENTOLIN HFA) 108 (90 Base) MCG/ACT inhaler Inhale 2 puffs into the lungs every 6 (six) hours as needed for wheezing or shortness of breath. 03/03/21   Freddi Starr, MD  Blood Glucose Monitoring Suppl (ONETOUCH VERIO) w/Device KIT by Does not apply route.    [provider]  Continuous Blood Gluc Receiver (DEXCOM G6 RECEIVER) DEVI 2 PER YEAR 01/27/21   Janith Lima, MD  Continuous Blood Gluc Sensor (DEXCOM G6 SENSOR) MISC 1 Act by Does not apply route daily. 01/27/21   Janith Lima, MD  Continuous Blood Gluc Transmit (DEXCOM G6 TRANSMITTER) MISC 1 Act by Does not apply route daily. 01/27/21   Janith Lima, MD  glucose blood (ONETOUCH VERIO) test strip 1 each by Other route in the morning, at noon, and at bedtime. Use as instructed 12/17/20   Janith Lima, MD  insulin aspart (NOVOLOG) 100 UNIT/ML injection For use in pump, total of 60 units per day 05/05/21   Renato Shin, MD  insulin glargine (LANTUS SOLOSTAR) 100 UNIT/ML Solostar Pen Inject 14 Units into the skin at bedtime. 03/23/21   Renato Shin, MD  insulin lispro (HUMALOG KWIKPEN) 100 UNIT/ML KwikPen 3 times a day (just before each meal), 6-10-8 units. 03/23/21   Renato Shin, MD  Insulin Pen Needle (B-D UF III MINI PEN NEEDLES) 31G X 5 MM MISC by Does not apply route.    [provider]  OneTouch Delica Lancets 38L MISC by Does not apply route.    [provider]  pioglitazone (ACTOS) 15 MG tablet Take 15 mg by mouth daily.    [provider]  umeclidinium-vilanterol (ANORO ELLIPTA) 62.5-25 MCG/INH AEPB Inhale 1 puff into the lungs daily as needed. 03/03/21   Freddi Starr, MD    Family History Family History  Problem Relation Age of Onset   Ovarian cancer Mother    Glaucoma Father    Colon cancer Father    Diabetes Neg Hx     Social History Social History   Tobacco Use   Smoking status: Never   Smokeless tobacco: Never  Vaping Use   Vaping Use: Never used  Substance Use  Topics   Alcohol use: Not Currently   Drug use: Never     Allergies   Patient has no known allergies.   Review of Systems Review of Systems Per HPI  Physical Exam Triage Vital Signs ED Triage Vitals [05/08/21 0824]  Enc Vitals Group     BP (!) 159/78     Pulse Rate 83     Resp 18     Temp 98.9 F (37.2 C)     Temp Source Oral     SpO2 94 %     Weight      Height      Head Circumference      Peak Flow      Pain Score      Pain Loc      Pain Edu?      Excl. in [place]?    No data found.  Updated Vital Signs BP (!) 159/78 (BP Location: Left Arm)   Pulse 83   Temp 98.9 F (37.2 C) (Oral)   Resp 18   SpO2 94%   Visual Acuity Right Eye Distance:   Left Eye Distance:   Bilateral Distance:    Right Eye Near:   Left Eye Near:    Bilateral Near:     Physical Exam Constitutional:      General: She is not in acute distress.    Appearance: Normal appearance. She is not toxic-appearing or diaphoretic.  HENT:     Head: Normocephalic and atraumatic.     Right Ear: Ear canal normal. A middle ear effusion is present. Tympanic membrane is not erythematous or bulging.     Left Ear: Ear canal normal. A middle ear effusion is present. Tympanic membrane is not erythematous or bulging.     Nose: Congestion present.     Mouth/Throat:     Mouth: Mucous membranes are moist.     Pharynx: Posterior oropharyngeal erythema present.  Eyes:     Extraocular Movements: Extraocular movements intact.     Conjunctiva/sclera: Conjunctivae normal.     Pupils: Pupils are equal, round, and reactive to light.  Cardiovascular:     Rate and Rhythm: Normal rate and regular rhythm.     Pulses: Normal pulses.     Heart sounds: Normal heart sounds.  Pulmonary:     Effort: Pulmonary effort is normal. No respiratory distress.     Breath sounds: Normal breath sounds. No wheezing.  Abdominal:     General: Abdomen is flat. Bowel sounds are normal.     Palpations: Abdomen is soft.   Musculoskeletal:        General: Normal range of motion.     Cervical back: Normal range of motion.  Skin:    General: Skin  is warm and dry.  Neurological:     General: No focal deficit present.     Mental Status: She is alert and oriented to person, place, and time. Mental status is at baseline.  Psychiatric:        Mood and Affect: Mood normal.        Behavior: Behavior normal.     UC Treatments / Results  Labs (all labs ordered are listed, but only abnormal results are displayed) Labs Reviewed  CULTURE, GROUP A STREP ([place])  NOVEL CORONAVIRUS, NAA  POCT RAPID STREP A (OFFICE)    EKG   Radiology No results found.  Procedures Procedures (including critical care time)  Medications Ordered in UC Medications - No data to display  Initial Impression / Assessment and Plan / UC Course  I have reviewed the triage vital signs and the nursing notes.  Pertinent labs & imaging results that were available during my care of the patient were reviewed by me and considered in my medical decision making (see chart for details).     Patient presents with symptoms likely from a viral upper respiratory infection. Differential includes bacterial pneumonia, sinusitis, allergic rhinitis, Covid 19. Do not suspect underlying cardiopulmonary process. Symptoms seem unlikely related to ACS, CHF or COPD exacerbations, pneumonia, pneumothorax. Patient is nontoxic appearing and not in need of emergent medical intervention.  Rapid strep test was negative.  Throat culture and COVID-19 viral swab are pending.  Recommended symptom control with over the counter medications that are safe to take with history of glaucoma such as plain Mucinex or Coricidin HBP: Daily oral anti-histamine, Oral decongestant or IN corticosteroid, saline irrigations, cepacol lozenges, Robitussin, Delsym, honey tea.  Return if symptoms fail to improve in 1-2 weeks or you develop shortness of breath, chest pain, severe headache.  Patient states understanding and is agreeable.  Discharged with PCP followup.  Final Clinical Impressions(s) / UC Diagnoses   Final diagnoses:  Sore throat  Encounter for laboratory testing for COVID-19 virus  Viral upper respiratory tract infection with cough     Discharge Instructions      You likely having a viral upper respiratory infection. We recommended symptom control. I expect your symptoms to start improving in the next 1-2 weeks.   1. Take a daily allergy pill/anti-histamine like Zyrtec, Claritin, or Store brand consistently for 2 weeks  2. For congestion you may try an oral decongestant like Mucinex or coricidin HBP. You may also try intranasal flonase nasal spray or saline irrigations (neti pot, sinus cleanse)  3. For your sore throat you may try cepacol lozenges, salt water gargles, throat spray. Treatment of congestion may also help your sore throat.  4. For cough you may try Robitussen  5. Take Tylenol or Ibuprofen to help with pain/inflammation  6. Stay hydrated, drink plenty of fluids to keep throat coated and less irritated  Honey Tea For cough/sore throat try using a honey-based tea. Use 3 teaspoons of honey with juice squeezed from half lemon. Place shaved pieces of ginger into 1/2-1 cup of water and warm over stove top. Then mix the ingredients and repeat every 4 hours as needed.   Rapid strep test was negative.  Throat culture and COVID-19 viral swab are pending.  We will call if they are positive.     ED Prescriptions   None    PDMP not reviewed this encounter.   Odis Luster, [place] 05/08/21 [id number]

## 2021-05-09 LAB — NOVEL CORONAVIRUS, NAA: SARS-CoV-2, NAA: DETECTED — AB

## 2021-05-09 LAB — SARS-COV-2, NAA 2 DAY TAT

## 2021-05-11 ENCOUNTER — Telehealth: Payer: Self-pay | Admitting: Internal Medicine

## 2021-05-11 LAB — CULTURE, GROUP A STREP (THRC)

## 2021-05-11 NOTE — Telephone Encounter (Signed)
[facility] FYI...   Caller states his wife is positive for Covid. She has a cough, nasal congestion, and sore throat.  Advised to call PCP within 24 hrs. Caller understood and agreed.

## 2021-05-14 ENCOUNTER — Encounter: Payer: Self-pay | Admitting: Endocrinology

## 2021-05-20 ENCOUNTER — Telehealth: Payer: Self-pay | Admitting: Dietician

## 2021-05-20 NOTE — Telephone Encounter (Signed)
Returned patient call re:  appointment for insulin pump training. Patient was not available.  Left message to call us back.  Oran Rein, RD, LDN, CDCES

## 2021-05-21 ENCOUNTER — Encounter: Payer: Self-pay | Admitting: Obstetrics and Gynecology

## 2021-05-21 ENCOUNTER — Ambulatory Visit (INDEPENDENT_AMBULATORY_CARE_PROVIDER_SITE_OTHER): Payer: BC Managed Care – PPO | Admitting: Obstetrics and Gynecology

## 2021-05-21 ENCOUNTER — Other Ambulatory Visit (HOSPITAL_COMMUNITY)
Admission: RE | Admit: 2021-05-21 | Discharge: 2021-05-21 | Disposition: A | Discharge status: Home / Self Care | Payer: BC Managed Care – PPO | Source: Ambulatory Visit | Attending: Obstetrics and Gynecology | Admitting: Obstetrics and Gynecology

## 2021-05-21 ENCOUNTER — Other Ambulatory Visit: Payer: Self-pay

## 2021-05-21 VITALS — BP 159/84 | HR 98 | Ht 63.0 in | Wt 160.0 lb

## 2021-05-21 DIAGNOSIS — Z01419 Encounter for gynecological examination (general) (routine) without abnormal findings: Secondary | ICD-10-CM | POA: Insufficient documentation

## 2021-05-21 LAB — HM PAP SMEAR

## 2021-05-21 NOTE — Progress Notes (Signed)
WELL-WOMAN PHYSICAL & PAP Patient name: Catherine Murphy MRN [id number]  Date of birth: 10/06/1955 Chief Complaint:   Gynecologic Exam  History of Present Illness:   Catherine Murphy is a 65 y.o. [id number] Asian female being seen today for a routine well-woman exam.  Current complaints: She moved from [place] to [place] in May 2022 after her husband retired. She wants a pap smear; "I get one every year." Her husband She wants to know if it is harmful for her to no longer have sex with her husband  PCP: Sanda Linger, MD      does not desire labs No LMP recorded. Patient is postmenopausal. The current method of family planning is post menopausal status.  Last pap 2021 with PCP in [place]. Results were: normal Last mammogram: 2021. Results were: normal. Family h/o breast cancer: No Last colonoscopy: 09/2019. Results were: normal. Family h/o colorectal cancer: Yes her father Review of Systems:   Pertinent items are noted in HPI Denies any headaches, blurred vision, fatigue, shortness of breath, chest pain, abdominal pain, abnormal vaginal discharge/itching/odor/irritation, problems with periods, bowel movements, urination, or intercourse unless otherwise stated above. Pertinent History Reviewed:  Reviewed past medical,surgical, social and family history.  Reviewed problem list, medications and allergies. Physical Assessment:   Vitals:   05/21/21 1034  BP: (!) 159/84  Pulse: 98  Weight: 160 lb (72.6 kg)  Height: 5\' 3"  (1.6 m)  Body mass index is 28.34 kg/m.        Physical Examination:   General appearance - well appearing, and in no distress  Mental status - alert, oriented to person, place, and time  Psych:  She has a normal mood and affect  Skin - warm and dry, normal color, no suspicious lesions noted  Chest - effort normal, all lung fields clear to auscultation bilaterally  Heart - normal rate and regular rhythm  Neck:  midline trachea, no thyromegaly or nodules  Breasts -  breasts appear normal, no suspicious masses, no skin or nipple changes or  axillary nodes  Abdomen - soft, nontender, nondistended, no masses or organomegaly  Pelvic - VULVA: normal appearing vulva with no masses, tenderness or lesions  VAGINA: normal appearing vagina with normal color and discharge, no lesions  CERVIX: normal appearing cervix without discharge or lesions, no CMT  Thin prep pap is done with HR HPV cotesting  UTERUS: uterus is felt to be normal size, shape, consistency and nontender   ADNEXA: No adnexal masses or tenderness noted.  Rectal - deferred  Extremities:  No swelling or varicosities noted  No results found for this or any previous visit (from the past 24 hour(s)).  Assessment & Plan:  1) Well-Woman Exam with Pap  2) Encounter for well woman exam with routine gynecological exam  - Cytology - PAP  - Reassurance given that not being sexually will not harm her - Reassurance given that her vaginal tissue appears to be well lubricated, good pink color and no signs of atrophy; which can come with age - discussed alternative ways to have intimacy and romantic pleasure with her husband besides penetrable SI  Labs/procedures today: pap  Mammogram scheduled for 05/26/2021 or sooner if problems Colonoscopy up-to-date or sooner if problems  No orders of the defined types were placed in this encounter.   Meds: No orders of the defined types were placed in this encounter.   Follow-up: Return in about 1 year (around 05/21/2022) for Annual Exam.  05/23/2022 MSN, CNM 05/21/2021  11:05 AM

## 2021-05-21 NOTE — Progress Notes (Signed)
Pt presents today as new patient for yearly exam. LMP/BCM: PM Last pap: MMG: scheduled by PCP: Colon: 09/2019 WNL due again 09/2029. Pt has no complaints.

## 2021-05-25 LAB — CYTOLOGY - PAP
Comment: NEGATIVE
Diagnosis: NEGATIVE
High risk HPV: NEGATIVE

## 2021-05-26 ENCOUNTER — Ambulatory Visit
Admission: RE | Admit: 2021-05-26 | Discharge: 2021-05-26 | Disposition: A | Discharge status: Home / Self Care | Payer: BC Managed Care – PPO | Source: Ambulatory Visit | Attending: Internal Medicine | Admitting: Internal Medicine

## 2021-05-26 ENCOUNTER — Other Ambulatory Visit: Payer: Self-pay

## 2021-05-26 DIAGNOSIS — Z1231 Encounter for screening mammogram for malignant neoplasm of breast: Secondary | ICD-10-CM

## 2021-05-27 ENCOUNTER — Ambulatory Visit: Payer: BC Managed Care – PPO | Admitting: Endocrinology

## 2021-05-27 VITALS — BP 160/68 | HR 84 | Ht 63.0 in | Wt 163.2 lb

## 2021-05-27 DIAGNOSIS — E118 Type 2 diabetes mellitus with unspecified complications: Secondary | ICD-10-CM | POA: Diagnosis not present

## 2021-05-27 DIAGNOSIS — Z23 Encounter for immunization: Secondary | ICD-10-CM | POA: Diagnosis not present

## 2021-05-27 LAB — POCT GLYCOSYLATED HEMOGLOBIN (HGB A1C): Hemoglobin A1C: 7.3 % — AB (ref 4.0–5.6)

## 2021-05-27 NOTE — Progress Notes (Signed)
Subjective:    Patient ID: Catherine Murphy, female    DOB: [birth year], 65 y.o.   MRN: [id number]  HPI Pt returns for f/u of diabetes mellitus: DM type: Insulin-requiring type 2 Dx'ed: 1638 Complications: none Therapy: insulin since dx, and 2 oral meds GDM: 1991 DKA: never Severe hypoglycemia: never Pancreatitis: never Pancreatic imaging: none known SDOH: none  Other: she takes multiple daily injections Interval history: I reviewed continuous glucose monitor data.  Glucose varies from 65-280.  It is in general lowest at 3AM, and highest at 10-11 AM.  It decreases overnight.  Otherwise, There is no trend throughout the day. Past Medical History:  Diagnosis Date   Diabetes mellitus without complication ([place])     Past Surgical History:  Procedure Laterality Date   CATARACT EXTRACTION Right    COLONOSCOPY     TRIGGER FINGER RELEASE      Social History   Socioeconomic History   Marital status: Married    Spouse name: Not on file   Number of children: Not on file   Years of education: Not on file   Highest education level: Not on file  Occupational History   Not on file  Tobacco Use   Smoking status: Never   Smokeless tobacco: Never  Vaping Use   Vaping Use: Never used  Substance and Sexual Activity   Alcohol use: Not Currently   Drug use: Never   Sexual activity: Yes    Birth control/protection: Post-menopausal  Other Topics Concern   Not on file  Social History Narrative   Not on file   Social Determinants of Health   Financial Resource Strain: Not on file  Food Insecurity: Not on file  Transportation Needs: Not on file  Physical Activity: Not on file  Stress: Not on file  Social Connections: Not on file  Intimate Partner Violence: Not on file    Current Outpatient Medications on File Prior to Visit  Medication Sig Dispense Refill   albuterol (VENTOLIN HFA) 108 (90 Base) MCG/ACT inhaler Inhale 2 puffs into the lungs every 6 (six) hours as needed for  wheezing or shortness of breath. 8 g 6   azelastine (ASTELIN) 0.1 % nasal spray Place 2 sprays into both nostrils as needed for rhinitis. Use in each nostril as directed     Blood Glucose Monitoring Suppl (ONETOUCH VERIO) w/Device KIT by Does not apply route.     Continuous Blood Gluc Receiver (DEXCOM G6 RECEIVER) DEVI 2 PER YEAR 3 each 2   Continuous Blood Gluc Sensor (DEXCOM G6 SENSOR) MISC 1 Act by Does not apply route daily. 3 each 1   Continuous Blood Gluc Transmit (DEXCOM G6 TRANSMITTER) MISC 1 Act by Does not apply route daily. 3 each 1   glucose blood (ONETOUCH VERIO) test strip 1 each by Other route in the morning, at noon, and at bedtime. Use as instructed 300 strip 1   insulin aspart (NOVOLOG) 100 UNIT/ML injection For use in pump, total of 60 units per day 60 mL 3   insulin glargine (LANTUS SOLOSTAR) 100 UNIT/ML Solostar Pen Inject 14 Units into the skin at bedtime. 15 mL 3   insulin lispro (HUMALOG KWIKPEN) 100 UNIT/ML KwikPen 3 times a day (just before each meal), 6-10-8 units. 15 mL 1   Insulin Pen Needle (B-D UF III MINI PEN NEEDLES) 31G X 5 MM MISC by Does not apply route.     metFORMIN (GLUCOPHAGE-XR) 500 MG 24 hr tablet Take 2 tablets (1,000 mg total)  by mouth daily with breakfast. 180 each 1   olmesartan (BENICAR) 40 MG tablet Take 1 tablet (40 mg total) by mouth daily. 90 tablet 1   OneTouch Delica Lancets 07M MISC by Does not apply route.     pioglitazone (ACTOS) 15 MG tablet Take 15 mg by mouth daily.     simvastatin (ZOCOR) 10 MG tablet Take 1 tablet (10 mg total) by mouth at bedtime. 90 tablet 1   umeclidinium-vilanterol (ANORO ELLIPTA) 62.5-25 MCG/INH AEPB Inhale 1 puff into the lungs daily as needed. 60 each 6   No current facility-administered medications on file prior to visit.    No Known Allergies  Family History  Problem Relation Age of Onset   Ovarian cancer Mother    Glaucoma Father    Colon cancer Father    Diabetes Neg Hx     BP (!) 160/68 (BP  Location: Right Arm, Patient Position: Sitting, Cuff Size: Large)   Pulse 84   Ht 5' 3" (1.6 m)   Wt 163 lb 3.2 oz (74 kg)   SpO2 96%   BMI 28.91 kg/m    Review of Systems     Objective:   Physical Exam EXT: no leg edema.     Lab Results  Component Value Date   HGBA1C 7.3 (A) 05/27/2021      Assessment & Plan:  Insulin-requiring type 2 DM Hypoglycemia, due to insulin: I advised pt to reduce lantus, and increase breakfast Novolog.  She declines  Patient Instructions  Your blood pressure is high today.  Please see your primary care provider soon, to have it rechecked check your blood sugar twice a day.  vary the time of day when you check, between before the 3 meals, and at bedtime.  also check if you have symptoms of your blood sugar being too high or too low.  please keep a record of the readings and bring it to your next appointment here (or you can bring the meter itself).  You can write it on any piece of paper.  please call us sooner if your blood sugar goes below 70, or if most of your readings are over 200.   Please change the insulins to the numbers listed below.   Please see a diabetes education specialist as scheduled, to consider a pump.   Please come back for a follow-up appointment in 3 months.     ?????????????????????????????? ?????????????????????????????????????????????????????????????????????????????????????????????????????? 70????????????? 200????????? ????????????? ????????????????????? ?? 3 ??????

## 2021-05-27 NOTE — Patient Instructions (Addendum)
Your blood pressure is high today.  Please see your primary care provider soon, to have it rechecked check your blood sugar twice a day.  vary the time of day when you check, between before the 3 meals, and at bedtime.  also check if you have symptoms of your blood sugar being too high or too low.  please keep a record of the readings and bring it to your next appointment here (or you can bring the meter itself).  You can write it on any piece of paper.  please call us sooner if your blood sugar goes below 70, or if most of your readings are over 200.   Please change the insulins to the numbers listed below.   Please see a diabetes education specialist as scheduled, to consider a pump.   Please come back for a follow-up appointment in 3 months.     ?????????????????????????????? ?????????????????????????????????????????????????????????????????????????????????????????????????????? 70????????????? 200????????? ????????????? ????????????????????? ?? 3 ??????

## 2021-06-02 ENCOUNTER — Other Ambulatory Visit: Payer: Self-pay

## 2021-06-02 ENCOUNTER — Encounter: Payer: BC Managed Care – PPO | Attending: Endocrinology | Admitting: Nutrition

## 2021-06-02 DIAGNOSIS — E118 Type 2 diabetes mellitus with unspecified complications: Secondary | ICD-10-CM | POA: Insufficient documentation

## 2021-06-03 ENCOUNTER — Telehealth: Payer: Self-pay | Admitting: Nutrition

## 2021-06-03 ENCOUNTER — Telehealth: Payer: Self-pay | Admitting: Dietician

## 2021-06-03 NOTE — Patient Instructions (Signed)
Give 4u for Breakfast, 8u for lunch and 6u for supper meals.   Schedule an appointment with the dietitian to learn carb counting. Call tandem help line if questions about the pump Call office if blood sugars running over 200, or less than 70

## 2021-06-03 NOTE — Progress Notes (Signed)
Patient was trained on how to use the T-connect pump.  We put in setting per Dr. George Hugh orders:  Basal r), ISF: 100, Target 110   Written instructions given for reducing her meal time insulin to 4u acB, 8u acL, and 6u acS. A new dexcom was started and linked to her pump.  Her tandem account was set up and linked to [place] [facility].  She redemonstrated how ot give a bolus correctly X2. She filled a cartridge using Novolog insulin and attached the infusion set to her lower right abdomen without difficulty.   We reviewed alerts and alarms, and patient reported that she could not learn any more this evening.  Sheduled return visit with Dr.Ellison on Monday to finish the training.  Handouts given on how to give the bolus, start/stop the pump, respond to alerts and alarms and how to change the cartridge.  She was told to review all of these and to read the manual.  She agreed to do this and had no final questions.

## 2021-06-03 NOTE — Telephone Encounter (Signed)
Patient was talked through making the basal rate change per Dr. George Hugh orders, as well as a reduction in meal time insulin amounts of 3u before breakfast, 7 before lunch and 5 before supper.  She reverbalized these amounts correctly.  She reported having bolused for breakfast this morning, 2u and her blood sugar was now 159 acL.  She had no questions for me at this time.

## 2021-06-03 NOTE — Telephone Encounter (Signed)
Patient reported no difficulty giving lunch bolus of 8u, but reported low blood sugar alarms going off at 2hr. Pc with a 70.  She ate 8 glucose tablets and drank 2 glasses of juice.  Her blood sugar was 70.  She took only 6u for dinner, but blood sugar again dropped at 2hr.pcS to 70. She retreated with juice and glucose tablets and at 1AM blood sugar was 194.  At 3AM she was woken up with low of 70,  She ate 6 glucose tablets and blood sugar did not rise until 8AM.   I tried to look on T-connect website, but was not able to log in under patient's name and password.  Pt. Will reset password and I will relink her when I call her back. Current pump settings: Basal rate:0.5, ISF: 100, Boluses: 4/8/6.

## 2021-06-03 NOTE — Telephone Encounter (Signed)
Patient left message on the answering machine that sensor did not work.   Called patient and stated that she has spoken to [place] and the problem is resolved. Oran Rein, RD, LDN, CDCES

## 2021-06-08 ENCOUNTER — Encounter: Payer: Self-pay | Admitting: Endocrinology

## 2021-06-08 ENCOUNTER — Other Ambulatory Visit: Payer: Self-pay

## 2021-06-08 ENCOUNTER — Ambulatory Visit (INDEPENDENT_AMBULATORY_CARE_PROVIDER_SITE_OTHER): Payer: BC Managed Care – PPO | Admitting: Endocrinology

## 2021-06-08 ENCOUNTER — Encounter: Payer: BC Managed Care – PPO | Admitting: Nutrition

## 2021-06-08 VITALS — BP 160/70 | HR 88 | Ht 63.0 in | Wt 160.8 lb

## 2021-06-08 DIAGNOSIS — E118 Type 2 diabetes mellitus with unspecified complications: Secondary | ICD-10-CM | POA: Diagnosis not present

## 2021-06-08 NOTE — Progress Notes (Signed)
Subjective:    Patient ID: Catherine Murphy, female    DOB: 31-Jan-1956, 65 y.o.   MRN: [id number]  HPI Pt returns for f/u of diabetes mellitus:  DM type: Insulin-requiring type 2 Dx'ed: 9211 Complications: none Therapy: insulin since dx, and 2 oral meds GDM: 1991 DKA: never Severe hypoglycemia: never Pancreatitis: never Pancreatic imaging: none known.   SDOH: none  Other: she takes multiple daily injections; she eats meals at 9AM, 2PM, and 7PM Interval history: I reviewed continuous glucose monitor data.  Glucose varies from 41-400.  It is in general lowest at 3AM-9AM, and highest at 2-5PM.  It decreases overnight.   She started pump rx last week.  She takes these settings: basal rate of 4 units/hr (when not in Control-IQ).   bolus of 3 times a day (just before each meal) 3-5-4 units correction bolus (which some people call "sensitivity," or "insulin sensitivity ratio," or just "isr") of 1 unit for each by 100 which glucose exceeds 110  She is in Control-IQ more 82% of the time.  TDD is 12 units (37% basal).   She has mild hypoglycemia QD.  This happens in the middle of the night.   Past Medical History:  Diagnosis Date   Diabetes mellitus without complication ([place])     Past Surgical History:  Procedure Laterality Date   CATARACT EXTRACTION Right    COLONOSCOPY     TRIGGER FINGER RELEASE      Social History   Socioeconomic History   Marital status: Married    Spouse name: Not on file   Number of children: Not on file   Years of education: Not on file   Highest education level: Not on file  Occupational History   Not on file  Tobacco Use   Smoking status: Never   Smokeless tobacco: Never  Vaping Use   Vaping Use: Never used  Substance and Sexual Activity   Alcohol use: Not Currently   Drug use: Never   Sexual activity: Yes    Birth control/protection: Post-menopausal  Other Topics Concern   Not on file  Social History Narrative   Not on file   Social  Determinants of Health   Financial Resource Strain: Not on file  Food Insecurity: Not on file  Transportation Needs: Not on file  Physical Activity: Not on file  Stress: Not on file  Social Connections: Not on file  Intimate Partner Violence: Not on file    Current Outpatient Medications on File Prior to Visit  Medication Sig Dispense Refill   albuterol (VENTOLIN HFA) 108 (90 Base) MCG/ACT inhaler Inhale 2 puffs into the lungs every 6 (six) hours as needed for wheezing or shortness of breath. 8 g 6   azelastine (ASTELIN) 0.1 % nasal spray Place 2 sprays into both nostrils as needed for rhinitis. Use in each nostril as directed     Blood Glucose Monitoring Suppl (ONETOUCH VERIO) w/Device KIT by Does not apply route.     Continuous Blood Gluc Receiver (DEXCOM G6 RECEIVER) DEVI 2 PER YEAR 3 each 2   Continuous Blood Gluc Sensor (DEXCOM G6 SENSOR) MISC 1 Act by Does not apply route daily. 3 each 1   Continuous Blood Gluc Transmit (DEXCOM G6 TRANSMITTER) MISC 1 Act by Does not apply route daily. 3 each 1   glucose blood (ONETOUCH VERIO) test strip 1 each by Other route in the morning, at noon, and at bedtime. Use as instructed 300 strip 1   insulin aspart (NOVOLOG)  100 UNIT/ML injection For use in pump, total of 60 units per day 60 mL 3   insulin glargine (LANTUS SOLOSTAR) 100 UNIT/ML Solostar Pen Inject 14 Units into the skin at bedtime. 15 mL 3   insulin lispro (HUMALOG KWIKPEN) 100 UNIT/ML KwikPen 3 times a day (just before each meal), 6-10-8 units. 15 mL 1   Insulin Pen Needle (B-D UF III MINI PEN NEEDLES) 31G X 5 MM MISC by Does not apply route.     metFORMIN (GLUCOPHAGE-XR) 500 MG 24 hr tablet Take 2 tablets (1,000 mg total) by mouth daily with breakfast. 180 each 1   olmesartan (BENICAR) 40 MG tablet Take 1 tablet (40 mg total) by mouth daily. 90 tablet 1   OneTouch Delica Lancets 58X MISC by Does not apply route.     pioglitazone (ACTOS) 15 MG tablet Take 15 mg by mouth daily.      simvastatin (ZOCOR) 10 MG tablet Take 1 tablet (10 mg total) by mouth at bedtime. 90 tablet 1   umeclidinium-vilanterol (ANORO ELLIPTA) 62.5-25 MCG/INH AEPB Inhale 1 puff into the lungs daily as needed. 60 each 6   No current facility-administered medications on file prior to visit.    No Known Allergies  Family History  Problem Relation Age of Onset   Ovarian cancer Mother    Glaucoma Father    Colon cancer Father    Diabetes Neg Hx     BP (!) 160/70 (BP Location: Right Arm, Patient Position: Sitting, Cuff Size: Large)   Pulse 88   Ht '5\' 3"'  (1.6 m)   Wt 160 lb 12.8 oz (72.9 kg)   SpO2 96%   BMI 28.48 kg/m    Review of Systems     Objective:   Physical Exam       Assessment & Plan:  Insulin-requiring type 2 DM: uncontrolled Hypoglycemia, due to insulin: this limits aggressiveness of glycemic control.    Patient Instructions  check your blood sugar twice a day.  vary the time of day when you check, between before the 3 meals, and at bedtime.  also check if you have symptoms of your blood sugar being too high or too low.  please keep a record of the readings and bring it to your next appointment here (or you can bring the meter itself).  You can write it on any piece of paper.  please call us sooner if your blood sugar goes below 70, or if most of your readings are over 200.   Please takes these pump settings: basal rate of 4 units/hr (when not in Control-IQ).   bolus of 3 times a day (just before each meal) 3-5-4 units correction bolus (which some people call "sensitivity," or "insulin sensitivity ratio," or just "isr") of 1 unit for each by 100 which glucose exceeds 110.   I have [place], to see why the pump is showing such a low number of meal bolus units. Also, I'll ask Vaughan Basta to raise your control-IQ goal, so the pump does not punch you low in the middle of the night.   Please come back for a follow-up appointment in 1 month.      ???????????????????????????????????????????????????????????????????????????????????????????????????????? 70?????????????? 200????????? ????????? ????? 4 ??/????? Control-IQ ???? ?? 3 ???????? 3-5-4 ?? ????? 110 ?? 100 ????????????"???"?"???????"????"isr"?? ????????????????????????????????? ??????????????????????????????????? ?? 1 ????????????

## 2021-06-08 NOTE — Patient Instructions (Addendum)
check your blood sugar twice a day.  vary the time of day when you check, between before the 3 meals, and at bedtime.  also check if you have symptoms of your blood sugar being too high or too low.  please keep a record of the readings and bring it to your next appointment here (or you can bring the meter itself).  You can write it on any piece of paper.  please call us sooner if your blood sugar goes below 70, or if most of your readings are over 200.   Please takes these pump settings: basal rate of 4 units/hr (when not in Control-IQ).   bolus of 3 times a day (just before each meal) 3-5-4 units correction bolus (which some people call "sensitivity," or "insulin sensitivity ratio," or just "isr") of 1 unit for each by 100 which glucose exceeds 110.   I have messaged Bonita Quin, to see why the pump is showing such a low number of meal bolus units. Also, I'll ask Bonita Quin to raise your control-IQ goal, so the pump does not punch you low in the middle of the night.   Please come back for a follow-up appointment in 1 month.     ???????????????????????????????????????????????????????????????????????????????????????????????????????? 70?????????????? 200????????? ????????? ????? 4 ??/????? Control-IQ ???? ?? 3 ???????? 3-5-4 ?? ????? 110 ?? 100 ????????????"???"?"???????"????"isr"?? ????????????????????????????????? ??????????????????????????????????? ?? 1 ????????????

## 2021-06-09 DIAGNOSIS — E1165 Type 2 diabetes mellitus with hyperglycemia: Secondary | ICD-10-CM | POA: Diagnosis not present

## 2021-06-09 NOTE — Progress Notes (Signed)
Patient is here with her husband to finish pump training.  Said had difficulty with changing cartridge and not putting enough insulin in cartridge.  Called help line and watched U-tube video on this, and was able to figure this out.   She appears to be bolusing correctly, but continues to add subtract bolus dose in her head when blood sugars are high/low.  Today blood sugar was 80, and she did not take her insulin before the meal and waited until after eating.  Now blood sugar is 389, with meal of "sticky" rice.  Discussed need to learn to count carbs and pt. Agreed to do this and will call when she feels more comfortable in using the pump--2 weeks. Reviewed need/how to change the Dexcom with using the pump, not the Phone.  She reported good understanding of this.  We reviewed all topics on checklist and she signed off as understanding all topics with no final questions.  She wants to come back in 2 weeks for another review of what she is doing, and I agreed that this would be a good ideal.  She agreed to call and schedule this when she gets home.  To see dr. Everardo All now for review of blood sugar readings.

## 2021-06-09 NOTE — Patient Instructions (Signed)
Read over handouts given and manual Return in 2 weeks to learn carb counting. Take insulin before meals.  If blood sugar is low, treat low, and then take insulin, and then eat.

## 2021-06-11 ENCOUNTER — Other Ambulatory Visit: Payer: Self-pay

## 2021-06-11 ENCOUNTER — Encounter: Payer: Self-pay | Admitting: Pulmonary Disease

## 2021-06-11 ENCOUNTER — Ambulatory Visit: Payer: BC Managed Care – PPO | Admitting: Pulmonary Disease

## 2021-06-11 VITALS — BP 128/74 | HR 106 | Ht 63.0 in | Wt 161.0 lb

## 2021-06-11 DIAGNOSIS — R0982 Postnasal drip: Secondary | ICD-10-CM

## 2021-06-11 DIAGNOSIS — R058 Other specified cough: Secondary | ICD-10-CM

## 2021-06-11 DIAGNOSIS — J4521 Mild intermittent asthma with (acute) exacerbation: Secondary | ICD-10-CM | POA: Diagnosis not present

## 2021-06-11 DIAGNOSIS — R0981 Nasal congestion: Secondary | ICD-10-CM | POA: Diagnosis not present

## 2021-06-11 MED ORDER — PREDNISONE 10 MG PO TABS: 20.0000 mg | ORAL_TABLET | Freq: Every day | ORAL | 0 refills | Status: AC

## 2021-06-11 MED ORDER — IPRATROPIUM BROMIDE 0.03 % NA SOLN: 2.0000 | Freq: Two times a day (BID) | NASAL | 12 refills | Status: DC

## 2021-06-11 NOTE — Patient Instructions (Addendum)
Start prednisone 20mg  daily for 7 days - if you feeling better by day 5, then you can stop  Monitor your blood sugar while taking the prednisone.  Continue anoro ellipta 1 puff daily  Use ipratropium nasal spray 1-2 sprays per nostril twice daily as needed for post-nasal drainage - stop Astelin nasal spray while using ipratropium.  Follow up in February as planned

## 2021-06-11 NOTE — Progress Notes (Signed)
Synopsis: Referred in August 2022 for cough by Scarlette Calico, MD  Subjective:   PATIENT ID: Catherine Murphy GENDER: female DOB: 1955/09/13, MRN: [id number]   HPI  Chief Complaint  Patient presents with   Follow-up    F/U on cough. States she has covid at the beginning at October 2022. Cough has gotten worse since then.     Catherine Murphy is a 65 year old woman, never smoker with history of diabetes mellitus type II who returns to pulmonary clinic for asthma with increased symptoms since covid last month.   She reports her cough and breathing improved after starting anoro ellipta daily after last visit.   She tested positive for covid on 05/08/21 and since she has had sinus congestion, pressure, post-nasal drainage, cough and wheezing. She denies fevers and chills. She does complain of generalized muscle aches.  OV 03/03/21 She reports developing a cough at the beginning of May due to spring allergies as she reports the cough has resolved and is not bothering her at this time.  She recently moved from [place], [place] where she had no seasonal allergies.  She also reports nasal congestion and possible postnasal drainage when she had the cough.  The cough was worse at night.  The cough is nonproductive.  She did use her husband's Qvar inhaler with improvement of the cough.  She also reports cough symptoms and chest tightness when exposed to cigarette smoke, strong perfumes/colognes or scented candles.  Note reviewed from her primary care physician Dr. Ronnald Ramp on 12/17/20 with chest radiograph showing no opacities or infiltrates at that time. She has had cough since the beginning of May.   She denies any history of smoking and no secondhand smoke exposure. She denies any occupational dust or chemical exposures over the years.  Past Medical History:  Diagnosis Date   Diabetes mellitus without complication ([place])      Family History  Problem Relation Age of Onset   Ovarian cancer Mother     Glaucoma Father    Colon cancer Father    Diabetes Neg Hx      Social History   Socioeconomic History   Marital status: Married    Spouse name: Not on file   Number of children: Not on file   Years of education: Not on file   Highest education level: Not on file  Occupational History   Not on file  Tobacco Use   Smoking status: Never   Smokeless tobacco: Never  Vaping Use   Vaping Use: Never used  Substance and Sexual Activity   Alcohol use: Not Currently   Drug use: Never   Sexual activity: Yes    Birth control/protection: Post-menopausal  Other Topics Concern   Not on file  Social History Narrative   Not on file   Social Determinants of Health   Financial Resource Strain: Not on file  Food Insecurity: Not on file  Transportation Needs: Not on file  Physical Activity: Not on file  Stress: Not on file  Social Connections: Not on file  Intimate Partner Violence: Not on file     No Known Allergies   Outpatient Medications Prior to Visit  Medication Sig Dispense Refill   albuterol (VENTOLIN HFA) 108 (90 Base) MCG/ACT inhaler Inhale 2 puffs into the lungs every 6 (six) hours as needed for wheezing or shortness of breath. 8 g 6   azelastine (ASTELIN) 0.1 % nasal spray Place 2 sprays into both nostrils as needed for rhinitis. Use  in each nostril as directed     Blood Glucose Monitoring Suppl (ONETOUCH VERIO) w/Device KIT by Does not apply route.     Continuous Blood Gluc Receiver (DEXCOM G6 RECEIVER) DEVI 2 PER YEAR 3 each 2   Continuous Blood Gluc Sensor (DEXCOM G6 SENSOR) MISC 1 Act by Does not apply route daily. 3 each 1   Continuous Blood Gluc Transmit (DEXCOM G6 TRANSMITTER) MISC 1 Act by Does not apply route daily. 3 each 1   glucose blood (ONETOUCH VERIO) test strip 1 each by Other route in the morning, at noon, and at bedtime. Use as instructed 300 strip 1   insulin aspart (NOVOLOG) 100 UNIT/ML injection For use in pump, total of 60 units per day 60 mL 3    insulin glargine (LANTUS SOLOSTAR) 100 UNIT/ML Solostar Pen Inject 14 Units into the skin at bedtime. 15 mL 3   insulin lispro (HUMALOG KWIKPEN) 100 UNIT/ML KwikPen 3 times a day (just before each meal), 6-10-8 units. 15 mL 1   Insulin Pen Needle (B-D UF III MINI PEN NEEDLES) 31G X 5 MM MISC by Does not apply route.     metFORMIN (GLUCOPHAGE-XR) 500 MG 24 hr tablet Take 2 tablets (1,000 mg total) by mouth daily with breakfast. 180 each 1   olmesartan (BENICAR) 40 MG tablet Take 1 tablet (40 mg total) by mouth daily. 90 tablet 1   OneTouch Delica Lancets 30Q MISC by Does not apply route.     pioglitazone (ACTOS) 15 MG tablet Take 15 mg by mouth daily.     simvastatin (ZOCOR) 10 MG tablet Take 1 tablet (10 mg total) by mouth at bedtime. 90 tablet 1   umeclidinium-vilanterol (ANORO ELLIPTA) 62.5-25 MCG/INH AEPB Inhale 1 puff into the lungs daily as needed. 60 each 6   No facility-administered medications prior to visit.    Review of Systems  Constitutional:  Negative for chills, fever, malaise/fatigue and weight loss.  HENT:  Positive for congestion. Negative for sinus pain and sore throat.   Eyes: Negative.   Respiratory:  Positive for cough, shortness of breath and wheezing. Negative for hemoptysis and sputum production.   Cardiovascular:  Negative for chest pain, palpitations, orthopnea, claudication and leg swelling.  Gastrointestinal:  Negative for abdominal pain, heartburn, nausea and vomiting.  Genitourinary: Negative.   Musculoskeletal:  Positive for myalgias. Negative for joint pain.  Skin:  Negative for rash.  Neurological:  Negative for weakness.  Endo/Heme/Allergies: Negative.   Psychiatric/Behavioral: Negative.     Objective:   Vitals:   06/11/21 1203  BP: 128/74  Pulse: (!) 106  SpO2: 96%  Weight: 161 lb (73 kg)  Height: '5\' 3"'  (1.6 m)    Physical Exam Constitutional:      General: She is not in acute distress.    Appearance: She is not ill-appearing.  HENT:      Head: Normocephalic and atraumatic.  Eyes:     General: No scleral icterus.    Conjunctiva/sclera: Conjunctivae normal.     Pupils: Pupils are equal, round, and reactive to light.  Cardiovascular:     Rate and Rhythm: Normal rate and regular rhythm.     Pulses: Normal pulses.     Heart sounds: Normal heart sounds. No murmur heard. Pulmonary:     Effort: Pulmonary effort is normal.     Breath sounds: Normal breath sounds. No wheezing, rhonchi or rales.     Comments: Scattered mild inspiratory chirps Abdominal:     General: Bowel sounds  are normal.     Palpations: Abdomen is soft.  Musculoskeletal:     Right lower leg: No edema.     Left lower leg: No edema.  Lymphadenopathy:     Cervical: No cervical adenopathy.  Skin:    General: Skin is warm and dry.  Neurological:     General: No focal deficit present.     Mental Status: She is alert.  Psychiatric:        Mood and Affect: Mood normal.        Behavior: Behavior normal.        Thought Content: Thought content normal.        Judgment: Judgment normal.    CBC    Component Value Date/Time   WBC 4.8 03/24/2021 1022   RBC 4.33 03/24/2021 1022   HGB 13.0 03/24/2021 1022   HCT 39.7 03/24/2021 1022   PLT 229.0 03/24/2021 1022   MCV 91.6 03/24/2021 1022   MCHC 32.8 03/24/2021 1022   RDW 12.9 03/24/2021 1022   LYMPHSABS 1.2 03/24/2021 1022   MONOABS 0.3 03/24/2021 1022   EOSABS 0.1 03/24/2021 1022   BASOSABS 0.0 03/24/2021 1022   BMP Latest Ref Rng & Units 03/24/2021  Glucose 70 - 99 mg/dL 143(H)  BUN 6 - 23 mg/dL 21  Creatinine 0.40 - 1.20 mg/dL 0.67  Sodium 135 - 145 mEq/L 138  Potassium 3.5 - 5.1 mEq/L 4.2  Chloride 96 - 112 mEq/L 104  CO2 19 - 32 mEq/L 25  Calcium 8.4 - 10.5 mg/dL 9.4   Chest imaging: CXR 12/17/20 Lungs hyperexpanded. Slight scarring left upper lobe. No edema or airspace opacity. Heart size and pulmonary vascularity are normal. No adenopathy. No bone lesions  PFT: No flowsheet data found.     Assessment & Plan:   Mild intermittent asthma with acute exacerbation - Plan: predniSONE (DELTASONE) 10 MG tablet  Post-viral cough syndrome  Sinus congestion  Post-nasal drip - Plan: ipratropium (ATROVENT) 0.03 % nasal spray  Discussion: Catherine Murphy is a 65 year old woman, never smoker with history of diabetes mellitus type II who returns to pulmonary clinic for asthma with increased symptoms since covid last month.   Her asthma symptoms improved with the anoro ellipta but then have become exacerbated since having covid last month. She is also dealing with sinus congestion and post-nasal drainage.   She is to take prednisone 59m for 5-7 days. If she is noticing significant benefit at day 5, she can stop there after otherwise she can complete the full 7 day course.   She is to continue anoro and albuterol as needed.   She is to use ipratropium for post nasal drainage and hold astelin until recovered.   Follow up in February 2023 as planned from last visit.  JFreda Jackson MD [facility] Office: [phone]  Current Outpatient Medications:    albuterol (VENTOLIN HFA) 108 (90 Base) MCG/ACT inhaler, Inhale 2 puffs into the lungs every 6 (six) hours as needed for wheezing or shortness of breath., Disp: 8 g, Rfl: 6   azelastine (ASTELIN) 0.1 % nasal spray, Place 2 sprays into both nostrils as needed for rhinitis. Use in each nostril as directed, Disp: , Rfl:    Blood Glucose Monitoring Suppl (ONETOUCH VERIO) w/Device KIT, by Does not apply route., Disp: , Rfl:    Continuous Blood Gluc Receiver (DEXCOM G6 RECEIVER) DEVI, 2 PER YEAR, Disp: 3 each, Rfl: 2   Continuous Blood Gluc Sensor (DEXCOM G6 SENSOR)  MISC, 1 Act by Does not apply route daily., Disp: 3 each, Rfl: 1   Continuous Blood Gluc Transmit (DEXCOM G6 TRANSMITTER) MISC, 1 Act by Does not apply route daily., Disp: 3 each, Rfl: 1   glucose blood (ONETOUCH VERIO) test strip, 1 each by Other route in the  morning, at noon, and at bedtime. Use as instructed, Disp: 300 strip, Rfl: 1   insulin aspart (NOVOLOG) 100 UNIT/ML injection, For use in pump, total of 60 units per day, Disp: 60 mL, Rfl: 3   insulin glargine (LANTUS SOLOSTAR) 100 UNIT/ML Solostar Pen, Inject 14 Units into the skin at bedtime., Disp: 15 mL, Rfl: 3   insulin lispro (HUMALOG KWIKPEN) 100 UNIT/ML KwikPen, 3 times a day (just before each meal), 6-10-8 units., Disp: 15 mL, Rfl: 1   Insulin Pen Needle (B-D UF III MINI PEN NEEDLES) 31G X 5 MM MISC, by Does not apply route., Disp: , Rfl:    ipratropium (ATROVENT) 0.03 % nasal spray, Place 2 sprays into both nostrils every 12 (twelve) hours., Disp: 30 mL, Rfl: 12   metFORMIN (GLUCOPHAGE-XR) 500 MG 24 hr tablet, Take 2 tablets (1,000 mg total) by mouth daily with breakfast., Disp: 180 each, Rfl: 1   olmesartan (BENICAR) 40 MG tablet, Take 1 tablet (40 mg total) by mouth daily., Disp: 90 tablet, Rfl: 1   OneTouch Delica Lancets 70W MISC, by Does not apply route., Disp: , Rfl:    pioglitazone (ACTOS) 15 MG tablet, Take 15 mg by mouth daily., Disp: , Rfl:    predniSONE (DELTASONE) 10 MG tablet, Take 2 tablets (20 mg total) by mouth daily with breakfast for 7 days., Disp: 14 tablet, Rfl: 0   simvastatin (ZOCOR) 10 MG tablet, Take 1 tablet (10 mg total) by mouth at bedtime., Disp: 90 tablet, Rfl: 1   umeclidinium-vilanterol (ANORO ELLIPTA) 62.5-25 MCG/INH AEPB, Inhale 1 puff into the lungs daily as needed., Disp: 60 each, Rfl: 6

## 2021-06-23 DIAGNOSIS — E118 Type 2 diabetes mellitus with unspecified complications: Secondary | ICD-10-CM | POA: Diagnosis not present

## 2021-06-28 NOTE — Telephone Encounter (Signed)
Opened in error

## 2021-06-29 ENCOUNTER — Ambulatory Visit (INDEPENDENT_AMBULATORY_CARE_PROVIDER_SITE_OTHER): Payer: BC Managed Care – PPO

## 2021-06-29 ENCOUNTER — Other Ambulatory Visit: Payer: Self-pay

## 2021-06-29 ENCOUNTER — Ambulatory Visit: Payer: BC Managed Care – PPO | Admitting: Internal Medicine

## 2021-06-29 ENCOUNTER — Encounter: Payer: Self-pay | Admitting: Internal Medicine

## 2021-06-29 VITALS — BP 142/68 | HR 87 | Temp 98.4°F | Resp 16 | Ht 63.0 in | Wt 161.0 lb

## 2021-06-29 DIAGNOSIS — E118 Type 2 diabetes mellitus with unspecified complications: Secondary | ICD-10-CM

## 2021-06-29 DIAGNOSIS — R052 Subacute cough: Secondary | ICD-10-CM

## 2021-06-29 DIAGNOSIS — I10 Essential (primary) hypertension: Secondary | ICD-10-CM | POA: Diagnosis not present

## 2021-06-29 DIAGNOSIS — Z23 Encounter for immunization: Secondary | ICD-10-CM

## 2021-06-29 DIAGNOSIS — R053 Chronic cough: Secondary | ICD-10-CM | POA: Diagnosis not present

## 2021-06-29 LAB — CBC WITH DIFFERENTIAL/PLATELET
Basophils Absolute: 0 10*3/uL (ref 0.0–0.1)
Basophils Relative: 0.9 % (ref 0.0–3.0)
Eosinophils Absolute: 0.2 10*3/uL (ref 0.0–0.7)
Eosinophils Relative: 4.1 % (ref 0.0–5.0)
HCT: 37.8 % (ref 36.0–46.0)
Hemoglobin: 12.4 g/dL (ref 12.0–15.0)
Lymphocytes Relative: 29.8 % (ref 12.0–46.0)
Lymphs Abs: 1.3 10*3/uL (ref 0.7–4.0)
MCHC: 32.9 g/dL (ref 30.0–36.0)
MCV: 92 fl (ref 78.0–100.0)
Monocytes Absolute: 0.4 10*3/uL (ref 0.1–1.0)
Monocytes Relative: 8.4 % (ref 3.0–12.0)
Neutro Abs: 2.4 10*3/uL (ref 1.4–7.7)
Neutrophils Relative %: 56.8 % (ref 43.0–77.0)
Platelets: 254 10*3/uL (ref 150.0–400.0)
RBC: 4.12 Mil/uL (ref 3.87–5.11)
RDW: 13.5 % (ref 11.5–15.5)
WBC: 4.3 10*3/uL (ref 4.0–10.5)

## 2021-06-29 LAB — BASIC METABOLIC PANEL
BUN: 17 mg/dL (ref 6–23)
CO2: 29 mEq/L (ref 19–32)
Calcium: 9.1 mg/dL (ref 8.4–10.5)
Chloride: 104 mEq/L (ref 96–112)
Creatinine, Ser: 0.67 mg/dL (ref 0.40–1.20)
GFR: 92.66 mL/min (ref >60.00)
Glucose, Bld: 109 mg/dL — ABNORMAL HIGH (ref 70–99)
Potassium: 4.1 mEq/L (ref 3.5–5.1)
Sodium: 138 mEq/L (ref 135–145)

## 2021-06-29 LAB — C-REACTIVE PROTEIN: CRP: <1 mg/dL (ref 0.5–20.0)

## 2021-06-29 MED ORDER — OLMESARTAN MEDOXOMIL 40 MG PO TABS: 40.0000 mg | ORAL_TABLET | Freq: Every day | ORAL | 1 refills | Status: DC

## 2021-06-29 NOTE — Patient Instructions (Signed)

## 2021-06-29 NOTE — Progress Notes (Signed)
Subjective:  Patient ID: Catherine Murphy, female    DOB: 11-May-1956  Age: 65 y.o. MRN: [id number]  CC: Cough, Hypertension, and Diabetes  This visit occurred during the SARS-CoV-2 public health emergency.  Safety protocols were in place, including screening questions prior to the visit, additional usage of staff PPE, and extensive cleaning of exam room while observing appropriate contact time as indicated for disinfecting solutions.    HPI Catherine Murphy presents for f/up -  She continues to have a slow recovery from COVID and complains of a mild but improving nonproductive cough.  She denies chest pain, shortness of breath, diaphoresis, fever, chills, wheezing, or shortness of breath.  Outpatient Medications Prior to Visit  Medication Sig Dispense Refill   albuterol (VENTOLIN HFA) 108 (90 Base) MCG/ACT inhaler Inhale 2 puffs into the lungs every 6 (six) hours as needed for wheezing or shortness of breath. 8 g 6   azelastine (ASTELIN) 0.1 % nasal spray Place 2 sprays into both nostrils as needed for rhinitis. Use in each nostril as directed     Blood Glucose Monitoring Suppl (ONETOUCH VERIO) w/Device KIT by Does not apply route.     Continuous Blood Gluc Receiver (DEXCOM G6 RECEIVER) DEVI 2 PER YEAR 3 each 2   Continuous Blood Gluc Sensor (DEXCOM G6 SENSOR) MISC 1 Act by Does not apply route daily. 3 each 1   Continuous Blood Gluc Transmit (DEXCOM G6 TRANSMITTER) MISC 1 Act by Does not apply route daily. 3 each 1   glucose blood (ONETOUCH VERIO) test strip 1 each by Other route in the morning, at noon, and at bedtime. Use as instructed 300 strip 1   insulin aspart (NOVOLOG) 100 UNIT/ML injection For use in pump, total of 60 units per day 60 mL 3   insulin glargine (LANTUS SOLOSTAR) 100 UNIT/ML Solostar Pen Inject 14 Units into the skin at bedtime. 15 mL 3   insulin lispro (HUMALOG KWIKPEN) 100 UNIT/ML KwikPen 3 times a day (just before each meal), 6-10-8 units. 15 mL 1   Insulin Pen Needle  (B-D UF III MINI PEN NEEDLES) 31G X 5 MM MISC by Does not apply route.     ipratropium (ATROVENT) 0.03 % nasal spray Place 2 sprays into both nostrils every 12 (twelve) hours. 30 mL 12   metFORMIN (GLUCOPHAGE-XR) 500 MG 24 hr tablet Take 2 tablets (1,000 mg total) by mouth daily with breakfast. 180 each 1   OneTouch Delica Lancets 54O MISC by Does not apply route.     pioglitazone (ACTOS) 15 MG tablet Take 15 mg by mouth daily.     simvastatin (ZOCOR) 10 MG tablet Take 1 tablet (10 mg total) by mouth at bedtime. 90 tablet 1   umeclidinium-vilanterol (ANORO ELLIPTA) 62.5-25 MCG/INH AEPB Inhale 1 puff into the lungs daily as needed. 60 each 6   olmesartan (BENICAR) 40 MG tablet Take 1 tablet (40 mg total) by mouth daily. 90 tablet 1   No facility-administered medications prior to visit.    ROS Review of Systems  Constitutional:  Negative for chills, diaphoresis, fatigue and fever.  HENT: Negative.  Negative for sore throat and trouble swallowing.   Eyes: Negative.   Respiratory:  Positive for cough. Negative for chest tightness, shortness of breath and wheezing.   Cardiovascular:  Negative for chest pain, palpitations and leg swelling.  Gastrointestinal:  Negative for abdominal pain, diarrhea and nausea.  Endocrine: Negative.   Genitourinary: Negative.  Negative for difficulty urinating, dysuria and hematuria.  Musculoskeletal: Negative.  Negative for myalgias.  Skin: Negative.  Negative for color change and rash.  Neurological:  Negative for dizziness, weakness, light-headedness and headaches.  Hematological:  Negative for adenopathy. Does not bruise/bleed easily.  Psychiatric/Behavioral: Negative.     Objective:  BP (!) 142/68 (BP Location: Left Arm, Patient Position: Sitting, Cuff Size: Large)   Pulse 87   Temp 98.4 F (36.9 C) (Oral)   Resp 16   Ht _0  (1.6 m)   Wt 161 lb (73 kg)   SpO2 96%   BMI 28.52 kg/m   BP Readings from Last 3 Encounters:  06/29/21 (!) 142/68   06/11/21 128/74  06/08/21 (!) 160/70    Wt Readings from Last 3 Encounters:  06/29/21 161 lb (73 kg)  06/11/21 161 lb (73 kg)  06/08/21 160 lb 12.8 oz (72.9 kg)    Physical Exam Vitals reviewed.  Constitutional:      Appearance: She is not ill-appearing.  HENT:     Nose: Nose normal.     Mouth/Throat:     Mouth: Mucous membranes are moist.  Eyes:     General: No scleral icterus.    Conjunctiva/sclera: Conjunctivae normal.  Cardiovascular:     Rate and Rhythm: Normal rate and regular rhythm.     Heart sounds: No murmur heard. Pulmonary:     Effort: Pulmonary effort is normal.     Breath sounds: No stridor. No wheezing, rhonchi or rales.  Abdominal:     General: Abdomen is flat.     Palpations: There is no mass.     Tenderness: There is no abdominal tenderness. There is no guarding.     Hernia: No hernia is present.  Musculoskeletal:        General: Normal range of motion.     Cervical back: Neck supple.     Right lower leg: No edema.     Left lower leg: No edema.  Lymphadenopathy:     Cervical: No cervical adenopathy.  Skin:    General: Skin is warm and dry.  Neurological:     General: No focal deficit present.     Mental Status: She is alert.  Psychiatric:        Mood and Affect: Mood normal.        Behavior: Behavior normal.    Lab Results  Component Value Date   WBC 4.3 06/29/2021   HGB 12.4 06/29/2021   HCT 37.8 06/29/2021   PLT 254.0 06/29/2021   GLUCOSE 109 (H) 06/29/2021   CHOL 185 03/24/2021   TRIG 60.0 03/24/2021   HDL 75.90 03/24/2021   LDLCALC 98 03/24/2021   ALT 14 03/24/2021   AST 22 03/24/2021   NA 138 06/29/2021   K 4.1 06/29/2021   CL 104 06/29/2021   CREATININE 0.67 06/29/2021   BUN 17 06/29/2021   CO2 29 06/29/2021   TSH 3.66 03/24/2021   HGBA1C 7.3 (A) 05/27/2021   MICROALBUR <0.7 03/24/2021    MM 3D SCREEN BREAST BILATERAL  Result Date: 06/16/2021 CLINICAL DATA:  Screening. EXAM: DIGITAL SCREENING BILATERAL MAMMOGRAM  WITH TOMOSYNTHESIS AND CAD TECHNIQUE: Bilateral screening digital craniocaudal and mediolateral oblique mammograms were obtained. Bilateral screening digital breast tomosynthesis was performed. The images were evaluated with computer-aided detection. COMPARISON:  Previous exam(s). ACR Breast Density Category d: The breast tissue is extremely dense, which lowers the sensitivity of mammography FINDINGS: There are no findings suspicious for malignancy. IMPRESSION: No mammographic evidence of malignancy. A result letter of this screening  mammogram will be mailed directly to the patient. RECOMMENDATION: Screening mammogram in one year. (Code:SM-B-01Y) BI-RADS CATEGORY  1: Negative. Electronically Signed   By: Nolon Nations M.D.   On: 06/16/2021 16:18   DG Chest 2 View  Result Date: 06/30/2021 CLINICAL DATA:  chronic cough s/p COVID EXAM: CHEST - 2 VIEW COMPARISON:  May 2022 FINDINGS: The lungs appear hyperinflated. The cardiomediastinal silhouette is within normal limits. No pleural effusion. No pneumothorax. No mass or consolidation. No acute osseous abnormality. IMPRESSION: Similar hyperinflated appearance of the lungs. No focal airspace disease. Electronically Signed   By: Albin Felling M.D.   On: 06/30/2021 08:01     Assessment & Plan:   Anaih was seen today for cough, hypertension and diabetes.  Diagnoses and all orders for this visit:  Type II diabetes mellitus with manifestations ([place])- Her blood sugar is adequately well controlled. -     CBC with Differential/Platelet; Future -     Basic metabolic panel; Future -     olmesartan (BENICAR) 40 MG tablet; Take 1 tablet (40 mg total) by mouth daily. -     Basic metabolic panel -     CBC with Differential/Platelet  Hypertension, unspecified type- She has not achieved her blood pressure goal of 130/80.  I have encouraged her to be more compliant with the ARB and to improve her lifestyle modifications. -     CBC with Differential/Platelet;  Future -     Basic metabolic panel; Future -     olmesartan (BENICAR) 40 MG tablet; Take 1 tablet (40 mg total) by mouth daily. -     Basic metabolic panel -     CBC with Differential/Platelet  Subacute cough- Her chest x-ray is negative for mass or infiltrate.  Her CRP is normal.  This is all reassuring. -     DG Chest 2 View; Future -     C-reactive protein; Future -     C-reactive protein  Other orders -     Varicella-zoster vaccine IM (Shingrix)  I am having Lynea C[employer]" maintain her B-D UF III MINI PEN NEEDLES, pioglitazone, azelastine, OneTouch Delica Lancets 71G, OneTouch Verio, metFORMIN, OneTouch Verio, Dexcom G6 Receiver, Dexcom G6 Sensor, Dexcom G6 Transmitter, Anoro Ellipta, albuterol, Lantus SoloStar, insulin lispro, simvastatin, insulin aspart, ipratropium, and olmesartan.  Meds ordered this encounter  Medications   olmesartan (BENICAR) 40 MG tablet    Sig: Take 1 tablet (40 mg total) by mouth daily.    Dispense:  90 tablet    Refill:  1     Follow-up: Return in about 3 months (around 09/28/2021).  Scarlette Calico, MD

## 2021-07-09 DIAGNOSIS — E1165 Type 2 diabetes mellitus with hyperglycemia: Secondary | ICD-10-CM | POA: Diagnosis not present

## 2021-07-15 ENCOUNTER — Other Ambulatory Visit: Payer: Self-pay

## 2021-07-15 ENCOUNTER — Ambulatory Visit: Payer: BC Managed Care – PPO | Admitting: Endocrinology

## 2021-07-15 VITALS — BP 144/70 | HR 109 | Ht 63.0 in | Wt 159.8 lb

## 2021-07-15 DIAGNOSIS — E118 Type 2 diabetes mellitus with unspecified complications: Secondary | ICD-10-CM

## 2021-07-15 LAB — POCT GLYCOSYLATED HEMOGLOBIN (HGB A1C): Hemoglobin A1C: 7.5 % — AB (ref 4.0–5.6)

## 2021-07-15 MED ORDER — TRULICITY 0.75 MG/0.5ML ~~LOC~~ SOAJ: 0.7500 mg | SUBCUTANEOUS | 3 refills | Status: DC

## 2021-07-15 NOTE — Patient Instructions (Addendum)
check your blood sugar twice a day.  vary the time of day when you check, between before the 3 meals, and at bedtime.  also check if you have symptoms of your blood sugar being too high or too low.  please keep a record of the readings and bring it to your next appointment here (or you can bring the meter itself).  You can write it on any piece of paper.  please call us sooner if your blood sugar goes below 70, or if most of your readings are over 200.   Please takes these pump settings: basal rate of 0.4 units/hr (when not in Control-IQ).   bolus of 3 times a day (just before each meal) 3-5-4 units.  correction bolus (which some people call "sensitivity," or "insulin sensitivity ratio," or just "isr") of 1 unit for each by 100 which glucose exceeds 110.   There is no need to inject extra Humalog.   I have sent a prescription to your pharmacy, to add Trulicity.   Please call if the blood sugar stays high, so we can increase it. Please come back for a follow-up appointment in 6 weeks.     ?????????????????????????????????????????????????????????????????????????????????????????????????????? 70???????????? 200????????? ????????? ????? 0.4 ??/?????? Control-IQ ??? ?? 3 ???????? 3-5-4 ???? ????? 110 ?? 100 ???????????????????????????isr?1 ???? ??????????? ??????????????? Trulicity? ?? 6 ???????????

## 2021-07-15 NOTE — Progress Notes (Signed)
Subjective:    Patient ID: Catherine Murphy, female    DOB: 01/26/1956, 66 y.o.   MRN: [id number]  HPI Pt returns for f/u of diabetes mellitus:  DM type: Insulin-requiring type 2 Dx'ed: 3532 Complications: none Therapy: insulin since dx, and 2 oral meds GDM: 1991 DKA: never Severe hypoglycemia: never Pancreatitis: never Pancreatic imaging: none known.   SDOH: none  Other: she takes multiple daily injections; she eats meals at 9AM, 2PM, and 7PM Interval history:   She takes these pump settings: basal rate of 0.4 units/hr (when not in Control-IQ).   bolus of 3 times a day (just before each meal) 3-5-4 units correction bolus (which some people call "sensitivity," or "insulin sensitivity ratio," or just "isr") of 1 unit for each by 100 which glucose exceeds 110.  I reviewed continuous glucose monitor data.  Glucose varies from 48-400.  It is in general lowest at 7AM, and highest at 11AM, 5PM, and 10PM.  It decreases overnight.  She is in Control-IQ more 97% of the time.  TDD is 43 units (32% basal).   She has mild hypoglycemia BIW.  This happens in the middle of the night.  She sometimes takes insulin injection, to supplement pump rx.  She says glucose is higher recently, due to steroid given for AB.  Pt says the pump download is counting correction boluses as mealtime boluses.  Past Medical History:  Diagnosis Date   Diabetes mellitus without complication ([place])     Past Surgical History:  Procedure Laterality Date   CATARACT EXTRACTION Right    COLONOSCOPY     TRIGGER FINGER RELEASE      Social History   Socioeconomic History   Marital status: Married    Spouse name: Not on file   Number of children: Not on file   Years of education: Not on file   Highest education level: Not on file  Occupational History   Not on file  Tobacco Use   Smoking status: Never   Smokeless tobacco: Never  Vaping Use   Vaping Use: Never used  Substance and Sexual Activity   Alcohol use:  Not Currently   Drug use: Never   Sexual activity: Yes    Birth control/protection: Post-menopausal  Other Topics Concern   Not on file  Social History Narrative   Not on file   Social Determinants of Health   Financial Resource Strain: Not on file  Food Insecurity: Not on file  Transportation Needs: Not on file  Physical Activity: Not on file  Stress: Not on file  Social Connections: Not on file  Intimate Partner Violence: Not on file    Current Outpatient Medications on File Prior to Visit  Medication Sig Dispense Refill   albuterol (VENTOLIN HFA) 108 (90 Base) MCG/ACT inhaler Inhale 2 puffs into the lungs every 6 (six) hours as needed for wheezing or shortness of breath. 8 g 6   azelastine (ASTELIN) 0.1 % nasal spray Place 2 sprays into both nostrils as needed for rhinitis. Use in each nostril as directed     Blood Glucose Monitoring Suppl (ONETOUCH VERIO) w/Device KIT by Does not apply route.     Continuous Blood Gluc Receiver (DEXCOM G6 RECEIVER) DEVI 2 PER YEAR 3 each 2   Continuous Blood Gluc Sensor (DEXCOM G6 SENSOR) MISC 1 Act by Does not apply route daily. 3 each 1   Continuous Blood Gluc Transmit (DEXCOM G6 TRANSMITTER) MISC 1 Act by Does not apply route daily. 3 each  1   glucose blood (ONETOUCH VERIO) test strip 1 each by Other route in the morning, at noon, and at bedtime. Use as instructed 300 strip 1   insulin aspart (NOVOLOG) 100 UNIT/ML injection For use in pump, total of 60 units per day 60 mL 3   insulin lispro (HUMALOG KWIKPEN) 100 UNIT/ML KwikPen 3 times a day (just before each meal), 6-10-8 units. 15 mL 1   Insulin Pen Needle (B-D UF III MINI PEN NEEDLES) 31G X 5 MM MISC by Does not apply route.     ipratropium (ATROVENT) 0.03 % nasal spray Place 2 sprays into both nostrils every 12 (twelve) hours. 30 mL 12   metFORMIN (GLUCOPHAGE-XR) 500 MG 24 hr tablet Take 2 tablets (1,000 mg total) by mouth daily with breakfast. 180 each 1   olmesartan (BENICAR) 40 MG  tablet Take 1 tablet (40 mg total) by mouth daily. 90 tablet 1   OneTouch Delica Lancets 99J MISC by Does not apply route.     pioglitazone (ACTOS) 15 MG tablet Take 15 mg by mouth daily.     simvastatin (ZOCOR) 10 MG tablet Take 1 tablet (10 mg total) by mouth at bedtime. 90 tablet 1   umeclidinium-vilanterol (ANORO ELLIPTA) 62.5-25 MCG/INH AEPB Inhale 1 puff into the lungs daily as needed. 60 each 6   No current facility-administered medications on file prior to visit.    No Known Allergies  Family History  Problem Relation Age of Onset   Ovarian cancer Mother    Glaucoma Father    Colon cancer Father    Diabetes Neg Hx     BP (!) 144/70    Pulse (!) 109    Ht _0  (1.6 m)    Wt 159 lb 12.8 oz (72.5 kg)    SpO2 99%    BMI 28.31 kg/m    Review of Systems     Objective:   Physical Exam    A1c=7.5%    Assessment & Plan:  Insulin-requiring type 2 DM: uncontrolled Hypoglycemia, due to insulin: we'll favor GLP rx.  Patient Instructions  check your blood sugar twice a day.  vary the time of day when you check, between before the 3 meals, and at bedtime.  also check if you have symptoms of your blood sugar being too high or too low.  please keep a record of the readings and bring it to your next appointment here (or you can bring the meter itself).  You can write it on any piece of paper.  please call us sooner if your blood sugar goes below 70, or if most of your readings are over 200.   Please takes these pump settings: basal rate of 0.4 units/hr (when not in Control-IQ).   bolus of 3 times a day (just before each meal) 3-5-4 units.  correction bolus (which some people call "sensitivity," or "insulin sensitivity ratio," or just "isr") of 1 unit for each by 100 which glucose exceeds 110.   There is no need to inject extra Humalog.   I have sent a prescription to your pharmacy, to add Trulicity.   Please call if the blood sugar stays high, so we can increase it. Please come  back for a follow-up appointment in 6 weeks.     ?????????????????????????????????????????????????????????????????????????????????????????????????????? 70???????????? 200????????? ????????? ????? 0.4 ??/?????? Control-IQ ??? ?? 3 ???????? 3-5-4 ???? ????? 110 ?? 100 ???????????????????????????isr?1 ???? ??????????? ??????????????? Trulicity? ?? 6 ???????????

## 2021-08-04 ENCOUNTER — Other Ambulatory Visit: Payer: Self-pay | Admitting: Internal Medicine

## 2021-08-04 DIAGNOSIS — E118 Type 2 diabetes mellitus with unspecified complications: Secondary | ICD-10-CM

## 2021-08-04 DIAGNOSIS — Z794 Long term (current) use of insulin: Secondary | ICD-10-CM

## 2021-08-04 DIAGNOSIS — E119 Type 2 diabetes mellitus without complications: Secondary | ICD-10-CM

## 2021-08-18 DIAGNOSIS — E118 Type 2 diabetes mellitus with unspecified complications: Secondary | ICD-10-CM | POA: Diagnosis not present

## 2021-08-27 ENCOUNTER — Encounter: Payer: Self-pay | Admitting: Endocrinology

## 2021-08-27 ENCOUNTER — Ambulatory Visit: Payer: BC Managed Care – PPO | Admitting: Endocrinology

## 2021-08-27 ENCOUNTER — Other Ambulatory Visit: Payer: Self-pay

## 2021-08-27 VITALS — BP 154/68 | HR 88 | Ht 63.0 in | Wt 158.4 lb

## 2021-08-27 DIAGNOSIS — E118 Type 2 diabetes mellitus with unspecified complications: Secondary | ICD-10-CM | POA: Diagnosis not present

## 2021-08-27 DIAGNOSIS — E1165 Type 2 diabetes mellitus with hyperglycemia: Secondary | ICD-10-CM | POA: Diagnosis not present

## 2021-08-27 NOTE — Progress Notes (Signed)
Subjective:    Patient ID: Catherine Murphy, female    DOB: 22-Dec-1955, 66 y.o.   MRN: [id number]  HPI Pt returns for f/u of diabetes mellitus:  DM type: Insulin-requiring type 2 Dx'ed: 4259 Complications: none Therapy: insulin since dx, and 2 oral meds (Tandem pump since 2022).   GDM: 1991 DKA: never Severe hypoglycemia: never Pancreatitis: never Pancreatic imaging: none known.   SDOH: none  Other: she takes multiple daily injections; she eats meals at 9AM, 2PM, and 7PM Interval history:   She takes these pump settings: basal rate of 0.4 units/hr (when not in Control-IQ).   bolus of 3 times a day (just before each meal) 4-6-4 units.  correction bolus (which some people call "sensitivity," or "insulin sensitivity ratio," or just "isr") of 1 unit for each by 100 which glucose exceeds 110.   I reviewed continuous glucose monitor data.  Glucose varies from 52-529.  It is again in general lowest at 7AM, and highest at 1AM, 5PM, 9PM.  It decreases overnight.  She is in Control-IQ more 98% of the time.  TDD is 37 units (34% basal).   She has mild hypoglycemia BIW.  This happens in the middle of the night.  Pt says infusion catheter is kinking at the infusion site.  This is causing non-delivery.  This has happened 3 times in the past 6 weeks.  She says this is causing the high total daily insulin infusion amounts.  No recent steroids.  She stopped Trulicity, due to "not feeling well."  Past Medical History:  Diagnosis Date   Diabetes mellitus without complication ([place])     Past Surgical History:  Procedure Laterality Date   CATARACT EXTRACTION Right    COLONOSCOPY     TRIGGER FINGER RELEASE      Social History   Socioeconomic History   Marital status: Married    Spouse name: Not on file   Number of children: Not on file   Years of education: Not on file   Highest education level: Not on file  Occupational History   Not on file  Tobacco Use   Smoking status: Never    Smokeless tobacco: Never  Vaping Use   Vaping Use: Never used  Substance and Sexual Activity   Alcohol use: Not Currently   Drug use: Never   Sexual activity: Yes    Birth control/protection: Post-menopausal  Other Topics Concern   Not on file  Social History Narrative   Not on file   Social Determinants of Health   Financial Resource Strain: Not on file  Food Insecurity: Not on file  Transportation Needs: Not on file  Physical Activity: Not on file  Stress: Not on file  Social Connections: Not on file  Intimate Partner Violence: Not on file    Current Outpatient Medications on File Prior to Visit  Medication Sig Dispense Refill   albuterol (VENTOLIN HFA) 108 (90 Base) MCG/ACT inhaler Inhale 2 puffs into the lungs every 6 (six) hours as needed for wheezing or shortness of breath. 8 g 6   azelastine (ASTELIN) 0.1 % nasal spray Place 2 sprays into both nostrils as needed for rhinitis. Use in each nostril as directed     Blood Glucose Monitoring Suppl (ONETOUCH VERIO) w/Device KIT by Does not apply route.     Continuous Blood Gluc Receiver (DEXCOM G6 RECEIVER) DEVI 2 PER YEAR 3 each 2   Continuous Blood Gluc Sensor (DEXCOM G6 SENSOR) MISC 1 Act by Does not apply  route daily. 3 each 1   Continuous Blood Gluc Transmit (DEXCOM G6 TRANSMITTER) MISC 1 Act by Does not apply route daily. 3 each 1   glucose blood (ONETOUCH VERIO) test strip 1 each by Other route in the morning, at noon, and at bedtime. Use as instructed 300 strip 1   insulin aspart (NOVOLOG) 100 UNIT/ML injection For use in pump, total of 60 units per day 60 mL 3   insulin lispro (HUMALOG KWIKPEN) 100 UNIT/ML KwikPen 3 times a day (just before each meal), 6-10-8 units. 15 mL 1   Insulin Pen Needle (B-D UF III MINI PEN NEEDLES) 31G X 5 MM MISC by Does not apply route.     ipratropium (ATROVENT) 0.03 % nasal spray Place 2 sprays into both nostrils every 12 (twelve) hours. 30 mL 12   metFORMIN (GLUCOPHAGE-XR) 500 MG 24 hr  tablet TAKE 2 TABLETS BY MOUTH  DAILY WITH BREAKFAST 180 tablet 0   olmesartan (BENICAR) 40 MG tablet Take 1 tablet (40 mg total) by mouth daily. 90 tablet 1   OneTouch Delica Lancets 29U MISC by Does not apply route.     pioglitazone (ACTOS) 15 MG tablet Take 15 mg by mouth daily.     simvastatin (ZOCOR) 10 MG tablet Take 1 tablet (10 mg total) by mouth at bedtime. 90 tablet 1   umeclidinium-vilanterol (ANORO ELLIPTA) 62.5-25 MCG/INH AEPB Inhale 1 puff into the lungs daily as needed. 60 each 6   No current facility-administered medications on file prior to visit.    No Known Allergies  Family History  Problem Relation Age of Onset   Ovarian cancer Mother    Glaucoma Father    Colon cancer Father    Diabetes Neg Hx     BP (!) 154/68    Pulse 88    Ht _0  (1.6 m)    Wt 158 lb 6.4 oz (71.8 kg)    SpO2 95%    BMI 28.06 kg/m   Review of Systems     Objective:   Physical Exam       Assessment & Plan:  Insulin-requiring type 2 DM: uncontrolled Dysphoria, due to Trulicity Device malfunction.  We discussed options  Patient Instructions  check your blood sugar twice a day.  vary the time of day when you check, between before the 3 meals, and at bedtime.  also check if you have symptoms of your blood sugar being too high or too low.  please keep a record of the readings and bring it to your next appointment here (or you can bring the meter itself).  You can write it on any piece of paper.  please call us sooner if your blood sugar goes below 70, or if most of your readings are over 200.   Please takes these pump settings: basal rate of 0.4 units/hr (when not in Control-IQ).   bolus of 3 times a day (just before each meal) 3-5-4 units.  correction bolus (which some people call "sensitivity," or "insulin sensitivity ratio," or just "isr") of 1 unit for each by 100 which glucose exceeds 110.   To address the catheter bending, I would be happy to prescribe for you a bent needle  infusion kit, or you could ask Vaughan Basta.  This is the next step in your diabetes care.   Please come back for a follow-up appointment in 6 weeks.     ?????????????????????????????????????????????????????????????????????????????????????????????????????? 70???????????? 200????????? ????????? ????? 0.4 ??/?????? Control-IQ ??? ?? 3 ???????? 3-5-4 ???? ?????  110 ?? 100 ???????????????????????????isr?1 ???? ???????????????????????????????????? Linda?????????????? ?? 6 ???????????

## 2021-08-27 NOTE — Patient Instructions (Addendum)
check your blood sugar twice a day.  vary the time of day when you check, between before the 3 meals, and at bedtime.  also check if you have symptoms of your blood sugar being too high or too low.  please keep a record of the readings and bring it to your next appointment here (or you can bring the meter itself).  You can write it on any piece of paper.  please call us sooner if your blood sugar goes below 70, or if most of your readings are over 200.   Please takes these pump settings: basal rate of 0.4 units/hr (when not in Control-IQ).   bolus of 3 times a day (just before each meal) 3-5-4 units.  correction bolus (which some people call "sensitivity," or "insulin sensitivity ratio," or just "isr") of 1 unit for each by 100 which glucose exceeds 110.   To address the catheter bending, I would be happy to prescribe for you a bent needle infusion kit, or you could ask Vaughan Basta.  This is the next step in your diabetes care.   Please come back for a follow-up appointment in 6 weeks.     ?????????????????????????????????????????????????????????????????????????????????????????????????????? 70???????????? 200????????? ????????? ????? 0.4 ??/?????? Control-IQ ??? ?? 3 ???????? 3-5-4 ???? ????? 110 ?? 100 ???????????????????????????isr?1 ???? ???????????????????????????????????? Linda?????????????? ?? 6 ???????????

## 2021-09-01 ENCOUNTER — Ambulatory Visit: Payer: BC Managed Care – PPO | Admitting: Pulmonary Disease

## 2021-09-23 ENCOUNTER — Other Ambulatory Visit: Payer: Self-pay | Admitting: Internal Medicine

## 2021-09-23 DIAGNOSIS — E119 Type 2 diabetes mellitus without complications: Secondary | ICD-10-CM

## 2021-09-23 DIAGNOSIS — E118 Type 2 diabetes mellitus with unspecified complications: Secondary | ICD-10-CM

## 2021-09-28 ENCOUNTER — Ambulatory Visit: Payer: BC Managed Care – PPO | Admitting: Internal Medicine

## 2021-09-28 ENCOUNTER — Other Ambulatory Visit: Payer: Self-pay

## 2021-09-28 ENCOUNTER — Encounter: Payer: Self-pay | Admitting: Internal Medicine

## 2021-09-28 VITALS — BP 132/64 | HR 82 | Temp 98.5°F | Resp 16 | Ht 63.0 in | Wt 160.0 lb

## 2021-09-28 DIAGNOSIS — E118 Type 2 diabetes mellitus with unspecified complications: Secondary | ICD-10-CM

## 2021-09-28 DIAGNOSIS — I10 Essential (primary) hypertension: Secondary | ICD-10-CM

## 2021-09-28 DIAGNOSIS — H35379 Puckering of macula, unspecified eye: Secondary | ICD-10-CM | POA: Insufficient documentation

## 2021-09-28 MED ORDER — LOSARTAN POTASSIUM 50 MG PO TABS: 50.0000 mg | ORAL_TABLET | Freq: Every day | ORAL | 1 refills | Status: DC

## 2021-09-28 NOTE — Progress Notes (Signed)
Subjective:  Patient ID: Catherine Murphy, female    DOB: 1956/06/09  Age: 66 y.o. MRN: [id number]  CC: Hypertension and Diabetes  This visit occurred during the SARS-CoV-2 public health emergency.  Safety protocols were in place, including screening questions prior to the visit, additional usage of staff PPE, and extensive cleaning of exam room while observing appropriate contact time as indicated for disinfecting solutions.    HPI Catherine Murphy presents for f/up -  She has decided to take losartan instead of olmesartan.  She is active and denies headache, blurred vision, chest pain, shortness of breath, diaphoresis, or edema.  Outpatient Medications Prior to Visit  Medication Sig Dispense Refill   albuterol (VENTOLIN HFA) 108 (90 Base) MCG/ACT inhaler Inhale 2 puffs into the lungs every 6 (six) hours as needed for wheezing or shortness of breath. 8 g 6   azelastine (ASTELIN) 0.1 % nasal spray Place 2 sprays into both nostrils as needed for rhinitis. Use in each nostril as directed     Blood Glucose Monitoring Suppl (ONETOUCH VERIO) w/Device KIT by Does not apply route.     Continuous Blood Gluc Receiver (DEXCOM G6 RECEIVER) DEVI 2 PER YEAR 3 each 2   Continuous Blood Gluc Sensor (DEXCOM G6 SENSOR) MISC Use as directed 3 each 1   Continuous Blood Gluc Transmit (DEXCOM G6 TRANSMITTER) MISC 1 Act by Does not apply route daily. 3 each 1   glucose blood (ONETOUCH VERIO) test strip 1 each by Other route in the morning, at noon, and at bedtime. Use as instructed 300 strip 1   insulin aspart (NOVOLOG) 100 UNIT/ML injection For use in pump, total of 60 units per day 60 mL 3   insulin lispro (HUMALOG KWIKPEN) 100 UNIT/ML KwikPen 3 times a day (just before each meal), 6-10-8 units. 15 mL 1   Insulin Pen Needle (B-D UF III MINI PEN NEEDLES) 31G X 5 MM MISC by Does not apply route.     ipratropium (ATROVENT) 0.03 % nasal spray Place 2 sprays into both nostrils every 12 (twelve) hours. 30 mL 12    metFORMIN (GLUCOPHAGE-XR) 500 MG 24 hr tablet TAKE 2 TABLETS BY MOUTH  DAILY WITH BREAKFAST 180 tablet 0   OneTouch Delica Lancets 49F MISC by Does not apply route.     pioglitazone (ACTOS) 15 MG tablet Take 15 mg by mouth daily.     simvastatin (ZOCOR) 10 MG tablet Take 1 tablet (10 mg total) by mouth at bedtime. 90 tablet 1   umeclidinium-vilanterol (ANORO ELLIPTA) 62.5-25 MCG/INH AEPB Inhale 1 puff into the lungs daily as needed. 60 each 6   olmesartan (BENICAR) 40 MG tablet Take 1 tablet (40 mg total) by mouth daily. 90 tablet 1   No facility-administered medications prior to visit.    ROS Review of Systems  All other systems reviewed and are negative.  Objective:  BP 132/64 (BP Location: Left Arm, Patient Position: Sitting, Cuff Size: Large)    Pulse 82    Temp 98.5 F (36.9 C) (Oral)    Resp 16    Ht '5\' 3"'  (1.6 m)    Wt 160 lb (72.6 kg)    SpO2 96%    BMI 28.34 kg/m   BP Readings from Last 3 Encounters:  09/28/21 132/64  08/27/21 (!) 154/68  07/15/21 (!) 144/70    Wt Readings from Last 3 Encounters:  09/28/21 160 lb (72.6 kg)  08/27/21 158 lb 6.4 oz (71.8 kg)  07/15/21 159 lb 12.8  oz (72.5 kg)    Physical Exam Vitals reviewed.  HENT:     Mouth/Throat:     Mouth: Mucous membranes are moist.  Eyes:     General: No scleral icterus.    Conjunctiva/sclera: Conjunctivae normal.  Cardiovascular:     Rate and Rhythm: Normal rate and regular rhythm.     Heart sounds: No murmur heard. Pulmonary:     Effort: Pulmonary effort is normal.     Breath sounds: No stridor. No wheezing, rhonchi or rales.  Abdominal:     General: Abdomen is flat.     Palpations: There is no mass.     Tenderness: There is no abdominal tenderness.  Musculoskeletal:        General: Normal range of motion.     Cervical back: Neck supple.     Right lower leg: No edema.  Lymphadenopathy:     Cervical: No cervical adenopathy.  Skin:    General: Skin is warm and dry.  Neurological:     General: No  focal deficit present.  Psychiatric:        Mood and Affect: Mood normal.        Behavior: Behavior normal.    Lab Results  Component Value Date   WBC 4.3 06/29/2021   HGB 12.4 06/29/2021   HCT 37.8 06/29/2021   PLT 254.0 06/29/2021   GLUCOSE 109 (H) 06/29/2021   CHOL 185 03/24/2021   TRIG 60.0 03/24/2021   HDL 75.90 03/24/2021   LDLCALC 98 03/24/2021   ALT 14 03/24/2021   AST 22 03/24/2021   NA 138 06/29/2021   K 4.1 06/29/2021   CL 104 06/29/2021   CREATININE 0.67 06/29/2021   BUN 17 06/29/2021   CO2 29 06/29/2021   TSH 3.66 03/24/2021   HGBA1C 7.5 (A) 07/15/2021   MICROALBUR <0.7 03/24/2021    MM 3D SCREEN BREAST BILATERAL  Result Date: 06/16/2021 CLINICAL DATA:  Screening. EXAM: DIGITAL SCREENING BILATERAL MAMMOGRAM WITH TOMOSYNTHESIS AND CAD TECHNIQUE: Bilateral screening digital craniocaudal and mediolateral oblique mammograms were obtained. Bilateral screening digital breast tomosynthesis was performed. The images were evaluated with computer-aided detection. COMPARISON:  Previous exam(s). ACR Breast Density Category d: The breast tissue is extremely dense, which lowers the sensitivity of mammography FINDINGS: There are no findings suspicious for malignancy. IMPRESSION: No mammographic evidence of malignancy. A result letter of this screening mammogram will be mailed directly to the patient. RECOMMENDATION: Screening mammogram in one year. (Code:SM-B-01Y) BI-RADS CATEGORY  1: Negative. Electronically Signed   By: Nolon Nations M.D.   On: 06/16/2021 16:18    Assessment & Plan:   Moncerrat was seen today for hypertension and diabetes.  Diagnoses and all orders for this visit:  Epiretinal membrane, unspecified laterality -     Ambulatory referral to Ophthalmology  Type II diabetes mellitus with manifestations ([place]) -     Ambulatory referral to Ophthalmology -     losartan (COZAAR) 50 MG tablet; Take 1 tablet (50 mg total) by mouth daily.  Hypertension, unspecified  type- Her blood pressure is well controlled. -     losartan (COZAAR) 50 MG tablet; Take 1 tablet (50 mg total) by mouth daily.   I have discontinued Deona C. Driskill "Helen"'s olmesartan. I am also having her start on losartan. Additionally, I am having her maintain her B-D UF III MINI PEN NEEDLES, pioglitazone, azelastine, OneTouch Delica Lancets 17E, OneTouch Verio, OneTouch Verio, Dexcom G6 Receiver, Dexcom G6 Transmitter, Anoro Ellipta, albuterol, insulin lispro, simvastatin, insulin aspart,  ipratropium, metFORMIN, and Dexcom G6 Sensor.  Meds ordered this encounter  Medications   losartan (COZAAR) 50 MG tablet    Sig: Take 1 tablet (50 mg total) by mouth daily.    Dispense:  90 tablet    Refill:  1     Follow-up: No follow-ups on file.  Scarlette Calico, MD

## 2021-10-05 DIAGNOSIS — E1165 Type 2 diabetes mellitus with hyperglycemia: Secondary | ICD-10-CM | POA: Diagnosis not present

## 2021-10-08 ENCOUNTER — Ambulatory Visit: Payer: BC Managed Care – PPO | Admitting: Endocrinology

## 2021-10-08 ENCOUNTER — Other Ambulatory Visit: Payer: Self-pay

## 2021-10-08 VITALS — BP 174/86 | HR 78 | Ht 63.0 in | Wt 157.8 lb

## 2021-10-08 DIAGNOSIS — E118 Type 2 diabetes mellitus with unspecified complications: Secondary | ICD-10-CM | POA: Diagnosis not present

## 2021-10-08 NOTE — Progress Notes (Signed)
? ?Subjective:  ? ? Patient ID: Catherine Murphy, female    DOB: 1956/02/16, 66 y.o.   MRN: [id number] ? ?HPI ?Pt returns for f/u of diabetes mellitus:  ?DM type: Insulin-requiring type 2 ?Dx'ed: 1993 ?Complications: none ?Therapy: insulin since dx, and 2 oral meds (Tandem pump since 2022).   ?GDM: 1991 ?DKA: never ?Severe hypoglycemia: never ?Pancreatitis: never ?Pancreatic imaging: none known.   ?SDOH: none  ?Other: she takes pump rx since 2022; she eats meals at 9AM, 2PM, and 7PM; She stopped Trulicity, due to "not feeling well."   ?Interval history:   ?She takes these pump settings: ?basal rate of 0.4 units/hr (when not in Control-IQ).   ?bolus of 3 times a day (just before each meal) 4-7-5 units.   ?correction bolus (which some people call "sensitivity," or "insulin sensitivity ratio," or just "isr") of 1 unit for each by 100 which glucose exceeds 110.  However, she adds this to meal bolus.   ?She is in Control-IQ more 98% of the time.  TDD is 36 units (31% basal).   ?I reviewed continuous glucose monitor data.  Glucose varies from 50-350.  It is in general highest at 4PM-7PM, and less high at 11AM and 10PM-11PM.  It is lowest at 4AM-7AM.  It decreases overnight.   ?Past Medical History:  ?Diagnosis Date  ? Diabetes mellitus without complication ([place])   ? ? ?Past Surgical History:  ?Procedure Laterality Date  ? CATARACT EXTRACTION Right   ? COLONOSCOPY    ? TRIGGER FINGER RELEASE    ? ? ?Social History  ? ?Socioeconomic History  ? Marital status: Married  ?  Spouse name: Not on file  ? Number of children: Not on file  ? Years of education: Not on file  ? Highest education level: Not on file  ?Occupational History  ? Not on file  ?Tobacco Use  ? Smoking status: Never  ? Smokeless tobacco: Never  ?Vaping Use  ? Vaping Use: Never used  ?Substance and Sexual Activity  ? Alcohol use: Not Currently  ? Drug use: Never  ? Sexual activity: Yes  ?  Birth control/protection: Post-menopausal  ?Other Topics Concern  ? Not on  file  ?Social History Narrative  ? Not on file  ? ?Social Determinants of Health  ? ?Financial Resource Strain: Not on file  ?Food Insecurity: Not on file  ?Transportation Needs: Not on file  ?Physical Activity: Not on file  ?Stress: Not on file  ?Social Connections: Not on file  ?Intimate Partner Violence: Not on file  ? ? ?Current Outpatient Medications on File Prior to Visit  ?Medication Sig Dispense Refill  ? albuterol (VENTOLIN HFA) 108 (90 Base) MCG/ACT inhaler Inhale 2 puffs into the lungs every 6 (six) hours as needed for wheezing or shortness of breath. 8 g 6  ? azelastine (ASTELIN) 0.1 % nasal spray Place 2 sprays into both nostrils as needed for rhinitis. Use in each nostril as directed    ? Blood Glucose Monitoring Suppl (ONETOUCH VERIO) w/Device KIT by Does not apply route.    ? Continuous Blood Gluc Receiver (DEXCOM G6 RECEIVER) DEVI 2 PER YEAR 3 each 2  ? Continuous Blood Gluc Sensor (DEXCOM G6 SENSOR) MISC Use as directed 3 each 1  ? Continuous Blood Gluc Transmit (DEXCOM G6 TRANSMITTER) MISC 1 Act by Does not apply route daily. 3 each 1  ? glucose blood (ONETOUCH VERIO) test strip 1 each by Other route in the morning, at noon, and at  bedtime. Use as instructed 300 strip 1  ? insulin aspart (NOVOLOG) 100 UNIT/ML injection For use in pump, total of 60 units per day 60 mL 3  ? insulin lispro (HUMALOG KWIKPEN) 100 UNIT/ML KwikPen 3 times a day (just before each meal), 6-10-8 units. 15 mL 1  ? Insulin Pen Needle (B-D UF III MINI PEN NEEDLES) 31G X 5 MM MISC by Does not apply route.    ? ipratropium (ATROVENT) 0.03 % nasal spray Place 2 sprays into both nostrils every 12 (twelve) hours. 30 mL 12  ? losartan (COZAAR) 50 MG tablet Take 1 tablet (50 mg total) by mouth daily. 90 tablet 1  ? metFORMIN (GLUCOPHAGE-XR) 500 MG 24 hr tablet TAKE 2 TABLETS BY MOUTH  DAILY WITH BREAKFAST 180 tablet 0  ? OneTouch Delica Lancets 83J MISC by Does not apply route.    ? pioglitazone (ACTOS) 15 MG tablet Take 15 mg by  mouth daily.    ? simvastatin (ZOCOR) 10 MG tablet Take 1 tablet (10 mg total) by mouth at bedtime. 90 tablet 1  ? umeclidinium-vilanterol (ANORO ELLIPTA) 62.5-25 MCG/INH AEPB Inhale 1 puff into the lungs daily as needed. 60 each 6  ? ?No current facility-administered medications on file prior to visit.  ? ? ?No Known Allergies ? ?Family History  ?Problem Relation Age of Onset  ? Ovarian cancer Mother   ? Glaucoma Father   ? Colon cancer Father   ? Diabetes Neg Hx   ? ? ?BP (!) 174/86   Pulse 78   Ht _0  (1.6 m)   Wt 157 lb 12.8 oz (71.6 kg)   SpO2 97%   BMI 27.95 kg/m?  ? ? ?Review of Systems ? ?   ?Objective:  ? Physical Exam ?VITAL SIGNS:  See vs page ?GENERAL: no distress ? ? ? ?A1c=7.2% ?   ?Assessment & Plan:  ?Insulin-requiring type 2 DM ?Hypoglycemia, due to insulin: this limits aggressiveness of glycemic control.   ? ? ?Patient Instructions  ?Your blood pressure is high today.  Please see your primary care provider soon, to have it rechecked ?check your blood sugar twice a day.  vary the time of day when you check, between before the 3 meals, and at bedtime.  also check if you have symptoms of your blood sugar being too high or too low.  please keep a record of the readings and bring it to your next appointment here (or you can bring the meter itself).  You can write it on any piece of paper.  please call us sooner if your blood sugar goes below 70, or if most of your readings are over 200.   ?Please takes these pump settings: ?basal rate of 0.4 units/hr (when not in Control-IQ).   ?bolus of 3 times a day (just before each meal) 5-10-5 units.   ?correction bolus (which some people call "sensitivity," or "insulin sensitivity ratio," or just "isr") of 1 unit for each by 100 which glucose exceeds 110.   ?Please come back for a follow-up appointment in 2 months.   ? ?????????????????????????????? ???????????????????????????????????????????????????????????????????????????????????????????????????????  70???????????? 200????????? ?????????? ?????? 0.4 ??/?????? Control-IQ ??? ??? 3 ???????? 5-10-5 ???? ?????? 110 ?? 100 ????????????????????????????????isr??1 ???? ??? 2 ???????????? ? ? ? ? ? ? ? ? ? ? ? ? ? ?

## 2021-10-08 NOTE — Patient Instructions (Addendum)
Your blood pressure is high today.  Please see your primary care provider soon, to have it rechecked ?check your blood sugar twice a day.  vary the time of day when you check, between before the 3 meals, and at bedtime.  also check if you have symptoms of your blood sugar being too high or too low.  please keep a record of the readings and bring it to your next appointment here (or you can bring the meter itself).  You can write it on any piece of paper.  please call us sooner if your blood sugar goes below 70, or if most of your readings are over 200.   ?Please takes these pump settings: ?basal rate of 0.4 units/hr (when not in Control-IQ).   ?bolus of 3 times a day (just before each meal) 5-10-5 units.   ?correction bolus (which some people call "sensitivity," or "insulin sensitivity ratio," or just "isr") of 1 unit for each by 100 which glucose exceeds 110.   ?Please come back for a follow-up appointment in 2 months.   ? ?????????????????????????????? ??????????????????????????????????????????????????????????????????????????????????????????????????????? 70???????????? 200????????? ?????????? ?????? 0.4 ??/?????? Control-IQ ??? ??? 3 ???????? 5-10-5 ???? ?????? 110 ?? 100 ????????????????????????????????isr??1 ???? ??? 2 ???????????? ? ? ? ? ? ? ? ? ? ? ? ?

## 2021-10-12 DIAGNOSIS — E113293 Type 2 diabetes mellitus with mild nonproliferative diabetic retinopathy without macular edema, bilateral: Secondary | ICD-10-CM | POA: Diagnosis not present

## 2021-10-12 DIAGNOSIS — H2512 Age-related nuclear cataract, left eye: Secondary | ICD-10-CM | POA: Diagnosis not present

## 2021-10-12 DIAGNOSIS — Z961 Presence of intraocular lens: Secondary | ICD-10-CM | POA: Diagnosis not present

## 2021-10-12 DIAGNOSIS — H35371 Puckering of macula, right eye: Secondary | ICD-10-CM | POA: Diagnosis not present

## 2021-10-12 LAB — HM DIABETES EYE EXAM

## 2021-10-13 ENCOUNTER — Telehealth: Payer: Self-pay | Admitting: Internal Medicine

## 2021-10-13 NOTE — Telephone Encounter (Signed)
Pts spouse states he requested a referral to Dr. Luciana Axe w/ [facility] but was contacted by another eye center for an appt ? ?Advised caller multiple times referral to Dr. Luciana Axe was placed on 09-28-2021, caller continued to inquire why another office called to schedule the appt  ? ?Caller requested to speak w/ provider ? ?Caller requesting a cb [phone] ?

## 2021-10-13 NOTE — Telephone Encounter (Signed)
Pt has been informed that only eye referral entered was for Dr. Luciana Axe as she requested.  ?

## 2021-10-19 ENCOUNTER — Telehealth: Payer: Self-pay | Admitting: Internal Medicine

## 2021-10-19 DIAGNOSIS — E119 Type 2 diabetes mellitus without complications: Secondary | ICD-10-CM

## 2021-10-19 DIAGNOSIS — E118 Type 2 diabetes mellitus with unspecified complications: Secondary | ICD-10-CM

## 2021-10-19 MED ORDER — DEXCOM G6 SENSOR MISC: 1 refills | Status: DC

## 2021-10-19 NOTE — Telephone Encounter (Signed)
1.Medication Requested: Continuous Blood Gluc Sensor (DEXCOM G6 SENSOR) MISC ? ?2. Pharmacy (Name, Street, [place]): [facility] [place] ? ?3. On Med List: Y ? ?4. Last Visit with PCP: 09-28-2021 ? ?5. Next visit date with PCP: 03-28-2022 ? ? ?Agent: Please be advised that RX refills may take up to 3 business days. We ask that you follow-up with your pharmacy.  ?

## 2021-10-21 ENCOUNTER — Telehealth: Payer: Self-pay | Admitting: Internal Medicine

## 2021-10-21 ENCOUNTER — Telehealth: Payer: Self-pay

## 2021-10-21 NOTE — Telephone Encounter (Signed)
09/28/21 OV note has been faxed ? ?[phone] is the correct fax number ?

## 2021-10-21 NOTE — Telephone Encounter (Signed)
Rep w/ [facility] requesting most recent office notes faxed to process   Continuous Blood Gluc Sensor (DEXCOM G6 SENSOR) MISC refill request ? ?Fax [phone] ? ?Acct # [id number] ?

## 2021-10-21 NOTE — Telephone Encounter (Signed)
Pt was advised to that she needs a PA for Continuous Blood Gluc Sensor (DEXCOM G6 SENSOR) MISC. ? ?Please advise ?

## 2021-10-22 DIAGNOSIS — E118 Type 2 diabetes mellitus with unspecified complications: Secondary | ICD-10-CM | POA: Diagnosis not present

## 2021-11-04 DIAGNOSIS — E1165 Type 2 diabetes mellitus with hyperglycemia: Secondary | ICD-10-CM | POA: Diagnosis not present

## 2021-11-05 ENCOUNTER — Other Ambulatory Visit: Payer: Self-pay | Admitting: Internal Medicine

## 2021-11-05 DIAGNOSIS — E119 Type 2 diabetes mellitus without complications: Secondary | ICD-10-CM

## 2021-11-05 DIAGNOSIS — E118 Type 2 diabetes mellitus with unspecified complications: Secondary | ICD-10-CM

## 2021-11-08 ENCOUNTER — Encounter (INDEPENDENT_AMBULATORY_CARE_PROVIDER_SITE_OTHER): Payer: Self-pay | Admitting: Ophthalmology

## 2021-11-08 ENCOUNTER — Ambulatory Visit (INDEPENDENT_AMBULATORY_CARE_PROVIDER_SITE_OTHER): Payer: BC Managed Care – PPO | Admitting: Ophthalmology

## 2021-11-08 DIAGNOSIS — Z961 Presence of intraocular lens: Secondary | ICD-10-CM | POA: Diagnosis not present

## 2021-11-08 DIAGNOSIS — H35371 Puckering of macula, right eye: Secondary | ICD-10-CM | POA: Diagnosis not present

## 2021-11-08 DIAGNOSIS — H2512 Age-related nuclear cataract, left eye: Secondary | ICD-10-CM

## 2021-11-08 DIAGNOSIS — E113293 Type 2 diabetes mellitus with mild nonproliferative diabetic retinopathy without macular edema, bilateral: Secondary | ICD-10-CM | POA: Diagnosis not present

## 2021-11-08 NOTE — Assessment & Plan Note (Signed)
OD looks great 

## 2021-11-08 NOTE — Assessment & Plan Note (Signed)
Moderate cataract left eye and no symptomatology per patient. ? ?Typical symptoms reviewed with the patient so she will know what they are when they occur ?

## 2021-11-08 NOTE — Assessment & Plan Note (Signed)
Post vitrectomy surgery 2020, [place] surgeon Dr. Laural Benes excellent acuity looks great no active disease ?

## 2021-11-08 NOTE — Progress Notes (Signed)
? ? ?11/08/2021 ? ?  ? ?CHIEF COMPLAINT ?Patient presents for  ?Chief Complaint  ?Patient presents with  ? Retina Evaluation  ? ? ? ? ?HISTORY OF PRESENT ILLNESS: ?Catherine Murphy is a 66 y.o. female who presents to the clinic today for:  ? ?HPI   ? ? Retina Evaluation   ? ?      ? Laterality: right eye  ? Associated Symptoms: Negative for Flashes, Floaters, Distortion, Blind Spot, Pain, Redness, Photophobia, Glare, Trauma, Scalp Tenderness, Jaw Claudication, Shoulder/Hip pain, Fever, Weight Loss and Fatigue  ? Treatments tried: no treatments  ? ?  ?  ? ? Comments   ?NP- Epiretinal membrane/diabetic eye exam referred by PCP. ?PT had surgery with former eye doctor roughly 3 years ago and wanted to follow up since pt recently moved to [place].  Notes implant reviewed.  Patient had vitrectomy membrane peel 08-15-2018 with Dr. Susa Raring ?Pt denies floaters and FOL. ? ? ? ? ?  ?  ?Last edited by Hurman Horn, MD on 11/08/2021  2:40 PM.  ?  ? ? ?Referring physician: ?Janith Lima, MD ?[place],  [place] [id number] ? ?HISTORICAL INFORMATION:  ? ?Selected notes from the [facility] ?  ? ?Lab Results  ?Component Value Date  ? HGBA1C 7.5 (A) 07/15/2021  ?  ? ?CURRENT MEDICATIONS: ?No current outpatient medications on file. (Ophthalmic Drugs)  ? ?No current facility-administered medications for this visit. (Ophthalmic Drugs)  ? ?Current Outpatient Medications (Other)  ?Medication Sig  ? albuterol (VENTOLIN HFA) 108 (90 Base) MCG/ACT inhaler Inhale 2 puffs into the lungs every 6 (six) hours as needed for wheezing or shortness of breath.  ? azelastine (ASTELIN) 0.1 % nasal spray Place 2 sprays into both nostrils as needed for rhinitis. Use in each nostril as directed  ? Blood Glucose Monitoring Suppl (ONETOUCH VERIO) w/Device KIT by Does not apply route.  ? Continuous Blood Gluc Receiver (DEXCOM G6 RECEIVER) DEVI 2 PER YEAR  ? Continuous Blood Gluc Sensor (DEXCOM G6 SENSOR) MISC Use as directed  ?  Continuous Blood Gluc Transmit (DEXCOM G6 TRANSMITTER) MISC 1 Act by Does not apply route daily.  ? glucose blood (ONETOUCH VERIO) test strip 1 each by Other route in the morning, at noon, and at bedtime. Use as instructed  ? insulin aspart (NOVOLOG) 100 UNIT/ML injection For use in pump, total of 60 units per day  ? insulin lispro (HUMALOG KWIKPEN) 100 UNIT/ML KwikPen 3 times a day (just before each meal), 6-10-8 units.  ? Insulin Pen Needle (B-D UF III MINI PEN NEEDLES) 31G X 5 MM MISC by Does not apply route.  ? ipratropium (ATROVENT) 0.03 % nasal spray Place 2 sprays into both nostrils every 12 (twelve) hours.  ? losartan (COZAAR) 50 MG tablet Take 1 tablet (50 mg total) by mouth daily.  ? metFORMIN (GLUCOPHAGE-XR) 500 MG 24 hr tablet TAKE 2 TABLETS BY MOUTH DAILY  WITH BREAKFAST  ? OneTouch Delica Lancets 54U MISC by Does not apply route.  ? pioglitazone (ACTOS) 15 MG tablet Take 15 mg by mouth daily.  ? simvastatin (ZOCOR) 10 MG tablet Take 1 tablet (10 mg total) by mouth at bedtime.  ? umeclidinium-vilanterol (ANORO ELLIPTA) 62.5-25 MCG/INH AEPB Inhale 1 puff into the lungs daily as needed.  ? ?No current facility-administered medications for this visit. (Other)  ? ? ? ? ?REVIEW OF SYSTEMS: ?ROS   ?Negative for: Constitutional, Gastrointestinal, Neurological, Skin, Genitourinary, Musculoskeletal, HENT, Endocrine,  Cardiovascular, Eyes, Respiratory, Psychiatric, Allergic/Imm, Heme/Lymph ?Last edited by Hurman Horn, MD on 11/08/2021  2:40 PM.  ?  ? ? ? ?ALLERGIES ?No Known Allergies ? ?PAST MEDICAL HISTORY ?Past Medical History:  ?Diagnosis Date  ? Diabetes mellitus without complication ([place])   ? ?Past Surgical History:  ?Procedure Laterality Date  ? CATARACT EXTRACTION Right   ? COLONOSCOPY    ? TRIGGER FINGER RELEASE    ? ? ?FAMILY HISTORY ?Family History  ?Problem Relation Age of Onset  ? Ovarian cancer Mother   ? Glaucoma Father   ? Colon cancer Father   ? Diabetes Neg Hx   ? ? ?SOCIAL HISTORY ?Social  History  ? ?Tobacco Use  ? Smoking status: Never  ? Smokeless tobacco: Never  ?Vaping Use  ? Vaping Use: Never used  ?Substance Use Topics  ? Alcohol use: Not Currently  ? Drug use: Never  ? ?  ? ?  ? ?OPHTHALMIC EXAM: ? ?Base Eye Exam   ? ? Visual Acuity (ETDRS)   ? ?   Right Left  ? Dist cc 20/25 20/20 -1  ? ? Correction: Glasses  ? ?  ?  ? ? Tonometry (Tonopen, 2:09 PM)   ? ?   Right Left  ? Pressure 15 17  ? ?  ?  ? ? Pupils   ? ?   Pupils Dark Light Shape React APD  ? Right PERRL 3 2 Round Brisk None  ? Left PERRL 3 2 Round Brisk None  ? ?  ?  ? ? Visual Fields   ? ?   Left Right  ?  Full Full  ? ?  ?  ? ? Extraocular Movement   ? ?   Right Left  ?  Full Full  ? ?  ?  ? ? Neuro/Psych   ? ? Oriented x3: Yes  ? ?  ?  ? ? Dilation   ? ? Both eyes: 1.0% Mydriacyl, 2.5% Phenylephrine @ 2:09 PM  ? ?  ?  ? ?  ? ?Slit Lamp and Fundus Exam   ? ? External Exam   ? ?   Right Left  ? External Normal Normal  ? ?  ?  ? ? Slit Lamp Exam   ? ?   Right Left  ? Lids/Lashes Normal Normal  ? Conjunctiva/Sclera White and quiet White and quiet  ? Cornea Clear Clear  ? Anterior Chamber Deep and quiet Deep and quiet  ? Iris Round and reactive Round and reactive  ? Lens Centered posterior chamber intraocular lens 2+ Nuclear sclerosis  ? Anterior Vitreous Normal, small remnant of retrolental hyaloid Normal  ? ?  ?  ? ? Fundus Exam   ? ?   Right Left  ? Posterior Vitreous Clear avitric Posterior vitreous detachment  ? Disc Normal Normal  ? C/D Ratio 0.35 0.35  ? Macula Normal Normal  ? Vessels NPDR- Mild NPDR- Mild  ? Periphery Normal Normal  ? ?  ?  ? ?  ? ? ?IMAGING AND PROCEDURES  ?Imaging and Procedures for 11/08/21 ? ?OCT, Retina - OU - Both Eyes   ? ?   ?Right Eye ?Quality was good. Scan locations included subfoveal. Central Foveal Thickness: 412. Progression has no prior data. Findings include normal observations.  ? ?Left Eye ?Quality was good. Scan locations included subfoveal. Central Foveal Thickness: 290. Progression has no  prior data. Findings include normal observations.  ? ?Notes ?Minor macular thickening OD  is a residual of previous epiretinal membrane, no active maculopathy OU ? ?  ? ?Color Fundus Photography Optos - OU - Both Eyes   ? ?   ?Right Eye ?Progression has no prior data. Disc findings include normal observations. Macula : normal observations.  ? ?Left Eye ?Progression has no prior data. Disc findings include normal observations. Macula : normal observations.  ? ?Notes ?Mild NPDR ? ?  ? ? ?  ?  ? ?  ?ASSESSMENT/PLAN: ? ?Epiretinal membrane ?Post vitrectomy surgery 2020, [place] surgeon Dr. Susa Raring excellent acuity looks great no active disease ? ?Nonproliferative diabetic retinopathy of both eyes ([place]) ?Only rare microaneurysms seen in the retinal periphery and posterior pole.  I explained to the patient that with her good blood sugar control that there is almost no risk of significant progression in a 83month ? ?Hence the patient needs examination only once a year ? ?Nuclear sclerotic cataract of left eye ?Moderate cataract left eye and no symptomatology per patient. ? ?Typical symptoms reviewed with the patient so she will know what they are when they occur ? ?Pseudophakia, right eye ?OD looks great  ? ?  ICD-10-CM   ?1. Nonproliferative diabetic retinopathy of both eyes ([place]  EG31.5176OCT, Retina - OU - Both Eyes  ?  Color Fundus Photography Optos - OU - Both Eyes  ?  ?2. Epiretinal membrane (ERM) of right eye  H35.371 OCT, Retina - OU - Both Eyes  ?  Color Fundus Photography Optos - OU - Both Eyes  ?  ?3. Nuclear sclerotic cataract of left eye  H25.12   ?  ?4. Pseudophakia, right eye  Z96.1   ?  ? ? ?1.  Excellent acuity OD, with residual thickening of the macula from prior [place] ? ?No other intervention required in the right eye.  Postvitrectomy cataract surgery performed looks great ? ?2.  [place]ont[place]anges left eye with  only mild symptomatology, no need for cataract surgery at this time ? ?3.  30 years of diabetes mellitus, only mild NPDR exist on examination in each eye thus annual follow-up examination here is appropriat

## 2021-11-08 NOTE — Assessment & Plan Note (Signed)
Only rare microaneurysms seen in the retinal periphery and posterior pole.  I explained to the patient that with her good blood sugar control that there is almost no risk of significant progression in a 33month. ? ?Hence the patient needs examination only once a year ?

## 2021-11-23 DIAGNOSIS — E118 Type 2 diabetes mellitus with unspecified complications: Secondary | ICD-10-CM | POA: Diagnosis not present

## 2021-12-03 DIAGNOSIS — E1165 Type 2 diabetes mellitus with hyperglycemia: Secondary | ICD-10-CM | POA: Diagnosis not present

## 2021-12-10 ENCOUNTER — Ambulatory Visit: Payer: BC Managed Care – PPO | Admitting: Endocrinology

## 2022-01-04 DIAGNOSIS — E1165 Type 2 diabetes mellitus with hyperglycemia: Secondary | ICD-10-CM | POA: Diagnosis not present

## 2022-01-06 ENCOUNTER — Ambulatory Visit: Payer: BC Managed Care – PPO | Admitting: Endocrinology

## 2022-01-06 ENCOUNTER — Encounter: Payer: Self-pay | Admitting: Endocrinology

## 2022-01-06 VITALS — BP 142/64 | HR 82 | Ht 63.0 in | Wt 158.2 lb

## 2022-01-06 DIAGNOSIS — Z794 Long term (current) use of insulin: Secondary | ICD-10-CM | POA: Diagnosis not present

## 2022-01-06 DIAGNOSIS — I10 Essential (primary) hypertension: Secondary | ICD-10-CM | POA: Diagnosis not present

## 2022-01-06 DIAGNOSIS — E1065 Type 1 diabetes mellitus with hyperglycemia: Secondary | ICD-10-CM

## 2022-01-06 DIAGNOSIS — E1165 Type 2 diabetes mellitus with hyperglycemia: Secondary | ICD-10-CM

## 2022-01-06 LAB — POCT GLYCOSYLATED HEMOGLOBIN (HGB A1C): Hemoglobin A1C: 7.3 % — AB (ref 4.0–5.6)

## 2022-01-06 NOTE — Progress Notes (Unsigned)
Patient ID: Catherine Murphy, female   DOB: Dec 11, 1955, 66 y.o.   MRN: [id number]           Reason for Appointment: Type II Diabetes follow-up  Dx'ed: 5784 Complications: none Therapy: insulin since dx, and 2 oral meds (Tandem pump since 2022).   GDM: 1991   Other: she takes pump rx since 2022; she eats meals at 9AM, 2PM, and 7PM; She stopped Trulicity, due to "not feeling well."   Interval history:   She takes these pump settings: basal rate of 0.4 units/hr (when not in Control-IQ).   bolus of 3 times a day (just before each meal) 4-7-5 units.   correction bolus (which some people call "sensitivity," or "insulin sensitivity ratio," or just "isr") of 1 unit for each by 100 which glucose exceeds 110.  However, she adds this to meal bolus.   She is in Control-IQ more 98% of the time.  TDD is 36 units (31% basal).   I reviewed continuous glucose monitor data.  Glucose varies from 50-350.  It is in general highest at 4PM-7PM, and less high at 11AM and 10PM-11PM.  It is lowest at 4AM-7AM.  It decreases overnight.    History of Present Illness   Diagnosis date:   Previous history:  Recent history:     basal rate of 0.4 units/hr (when not in Control-IQ).   bolus of 3 times a day (just before each meal) 4-7-5 units.   correction bolus (which some people call "sensitivity," or "insulin sensitivity ratio," or just "isr") of 1 unit for each by 100     Insulin regimen:            Side effects from medications: None  Current self management, blood sugar patterns and problems identified:  A1c is  Exercise: Diet management:      Monitors blood glucose: Once a day.    Glucometer: One Touch.           Blood Glucose readings from meter download:   PRE-MEAL Fasting Lunch Dinner Bedtime Overall  Glucose range:       Mean/median:        POST-MEAL PC Breakfast PC Lunch PC Dinner  Glucose range:     Mean/median:        Hypoglycemia:  none                        Dietician visit: Most  recent:    `  Weight control:  Wt Readings from Last 3 Encounters:  01/06/22 158 lb 3.2 oz (71.8 kg)  10/08/21 157 lb 12.8 oz (71.6 kg)  09/28/21 160 lb (72.6 kg)            Diabetes labs:  Lab Results  Component Value Date   HGBA1C 7.3 (A) 01/06/2022   HGBA1C 7.5 (A) 07/15/2021   HGBA1C 7.3 (A) 05/27/2021   Lab Results  Component Value Date   MICROALBUR <0.7 03/24/2021   LDLCALC 98 03/24/2021   CREATININE 0.67 06/29/2021     Allergies as of 01/06/2022   No Known Allergies      Medication List        Accurate as of January 06, 2022  4:42 PM. If you have any questions, ask your nurse or doctor.          albuterol 108 (90 Base) MCG/ACT inhaler Commonly known as: VENTOLIN HFA Inhale 2 puffs into the lungs every 6 (six) hours as needed for wheezing or  shortness of breath.   Anoro Ellipta 62.5-25 MCG/ACT Aepb Generic drug: umeclidinium-vilanterol Inhale 1 puff into the lungs daily as needed.   azelastine 0.1 % nasal spray Commonly known as: ASTELIN Place 2 sprays into both nostrils as needed for rhinitis. Use in each nostril as directed   B-D UF III MINI PEN NEEDLES 31G X 5 MM Misc Generic drug: Insulin Pen Needle by Does not apply route.   Dexcom G6 Receiver Devi 2 PER YEAR   Dexcom G6 Sensor Misc Use as directed   Dexcom G6 Transmitter Misc 1 Act by Does not apply route daily.   insulin aspart 100 UNIT/ML injection Commonly known as: novoLOG For use in pump, total of 60 units per day   insulin lispro 100 UNIT/ML KwikPen Commonly known as: HumaLOG KwikPen 3 times a day (just before each meal), 6-10-8 units.   ipratropium 0.03 % nasal spray Commonly known as: ATROVENT Place 2 sprays into both nostrils every 12 (twelve) hours.   losartan 50 MG tablet Commonly known as: COZAAR Take 1 tablet (50 mg total) by mouth daily.   metFORMIN 500 MG 24 hr tablet Commonly known as: GLUCOPHAGE-XR TAKE 2 TABLETS BY MOUTH DAILY  WITH BREAKFAST   OneTouch  Delica Lancets 68L Misc by Does not apply route.   OneTouch Verio test strip Generic drug: glucose blood 1 each by Other route in the morning, at noon, and at bedtime. Use as instructed   OneTouch Verio w/Device Kit by Does not apply route.   pioglitazone 15 MG tablet Commonly known as: ACTOS Take 15 mg by mouth daily.   simvastatin 10 MG tablet Commonly known as: ZOCOR Take 1 tablet (10 mg total) by mouth at bedtime.        Allergies: No Known Allergies  Past Medical History:  Diagnosis Date   Diabetes mellitus without complication ([place])     Past Surgical History:  Procedure Laterality Date   CATARACT EXTRACTION Right    COLONOSCOPY     TRIGGER FINGER RELEASE      Family History  Problem Relation Age of Onset   Ovarian cancer Mother    Glaucoma Father    Colon cancer Father    Diabetes Neg Hx     Social History:  reports that she has never smoked. She has never used smokeless tobacco. She reports that she does not currently use alcohol. She reports that she does not use drugs.  Review of Systems:  Last diabetic eye exam date  Last foot exam date:   Hypertension:    BP Readings from Last 3 Encounters:  01/06/22 (!) 142/64  10/08/21 (!) 174/86  09/28/21 132/64    Lipids:     Lab Results  Component Value Date   CHOL 185 03/24/2021   Lab Results  Component Value Date   HDL 75.90 03/24/2021   Lab Results  Component Value Date   LDLCALC 98 03/24/2021   Lab Results  Component Value Date   TRIG 60.0 03/24/2021   Lab Results  Component Value Date   CHOLHDL 2 03/24/2021   No results found for: "LDLDIRECT"   Examination:   BP (!) 142/64   Pulse 82   Ht _0  (1.6 m)   Wt 158 lb 3.2 oz (71.8 kg)   SpO2 94%   BMI 28.02 kg/m   Body mass index is 28.02 kg/m.    ASSESSMENT/ PLAN:    Diabetes type 2:   Current regimen:  Blood glucose control  There are  no Patient Instructions on file for this visit.   Elayne Snare 01/06/2022,  4:42 PM

## 2022-01-06 NOTE — Patient Instructions (Signed)
Turn on exercise mode for exercise  Turn off sleep mode after active day

## 2022-01-07 LAB — MICROALBUMIN / CREATININE URINE RATIO
Creatinine,U: 135 mg/dL
Microalb Creat Ratio: 56.7 mg/g — ABNORMAL HIGH (ref 0.0–30.0)
Microalb, Ur: 76.5 mg/dL — ABNORMAL HIGH (ref 0.0–1.9)

## 2022-01-11 ENCOUNTER — Encounter: Payer: Self-pay | Admitting: Dietician

## 2022-01-11 ENCOUNTER — Encounter: Payer: BC Managed Care – PPO | Attending: Internal Medicine | Admitting: Dietician

## 2022-01-11 DIAGNOSIS — E118 Type 2 diabetes mellitus with unspecified complications: Secondary | ICD-10-CM | POA: Insufficient documentation

## 2022-01-11 NOTE — Patient Instructions (Addendum)
Practice carbohydrate counting.  The following can help with carbohydrate counting. Papers provided Calorie king app Food label Beckey Rutter   I will call you about changing your insulin to carbohydrate ratio.  Currently your pump is not set up to enter carbohydrates as your insulin to carb ratio is 1:1.  We changed your correction factor to 1:50.  This will help when your pump is in manual mode.  Be sure to eat a snack when you are exercising particularly after a class and before a walk.  Keep an eye on your sensor readings when you are exercising.  We will continue to learn more about the sleep mode.  Dr. Lucianne Muss recommended for you to turn off the sleep mode if you are exercising a lot in the afternoon or evening to avoid low blood glucose.  You have been show how to do this.  You state that you know how to do manual corrections when needed.

## 2022-01-11 NOTE — Progress Notes (Signed)
Diabetes Self-Management Education  Visit Type: Follow-up  Appt. Start Time: 0900 Appt. End Time: 0945  01/11/2022  [name], identified by name and date of birth, is a 66 y.o. female with a diagnosis of Diabetes:  .   ASSESSMENT Patient is here today with her husband.  She works closely with Bonita Quin, CDCES and I last saw her 04/08/2021. Today, the visit is to make changes in her insulin pump per Dr. Lucianne Muss. During this visit, patient changed the correction factor from 1:100 to 1:50.  Instructed patient and husband what correction factor (or sensitivity factor) is.  Patient was initially hesitant to make this pump adjustment but understood the benefit after discussion. Discussed sleep mode and to turn the sleep mode off if she is exercising a lot in the afternoon or evening to avoid lows.  Instructed patient how to do this and she was able to demonstrate. Patient states that she makes manual corrections and has no questions about this. Instructed patient and husband on carbohydrate counting. Currently, her insulin pump I:C is set at 1:1 and she usually gives 5 units of insulin for breakfast and dinner and 7 units of insulin for lunch. Per message from MD, I:C should be 1:6 for breakfast and 1:10 for lunch and dinner.  Patient is to see me on 01/18/2022 at 9 am for me to help her program this.  Dexcom report reviewed with patient.  History includes Type 2 diabetes, HTN Medications noted to include Metformin , Actos, Humalog via t:slim pump A1C 7.5% 03/23/2021 CGM:  Dexcom G6   Patient lives with her husband. She is from [place].  Understands and speaks English very well.    Diabetes Self-Management Education - 01/11/22 1643       Visit Information   Visit Type Follow-up      Psychosocial Assessment   Other persons present Patient;Spouse/SO      Pre-Education Assessment   Patient understands the diabetes disease and treatment process. Demonstrates understanding / competency     Patient understands incorporating nutritional management into lifestyle. Needs Review    Patient undertands incorporating physical activity into lifestyle. Demonstrates understanding / competency    Patient understands using medications safely. Demonstrates understanding / competency    Patient understands monitoring blood glucose, interpreting and using results Demonstrates understanding / competency    Patient understands prevention, detection, and treatment of acute complications. Demonstrates understanding / competency    Patient understands prevention, detection, and treatment of chronic complications. Demonstrates understanding / competency    Patient understands how to develop strategies to address psychosocial issues. Demonstrates understanding / competency    Patient understands how to develop strategies to promote health/change behavior. Demonstrates understanding / competency      Complications   How often do you check your blood sugar? > 4 times/day      Dietary Intake   Breakfast boiled egg, 1 slice toast ([employer]), coffee with 1 tsp honey      Patient Education   Previous Diabetes Education Yes (please comment)   frequent   Medications --   insulin pump changes and education     Individualized Goals (developed by patient)   Nutrition Carb counting      Post-Education Assessment   Patient understands the diabetes disease and treatment process. Demonstrates understanding / competency    Patient understands incorporating nutritional management into lifestyle. Comprehends key points    Patient undertands incorporating physical activity into lifestyle. Demonstrates understanding / competency    Patient understands  using medications safely. Demonstrates understanding / competency    Patient understands monitoring blood glucose, interpreting and using results Demonstrates understanding / competency    Patient understands prevention, detection, and treatment of acute  complications. Demonstrates understanding / competency    Patient understands prevention, detection, and treatment of chronic complications. Demonstrates understanding / competency    Patient understands how to develop strategies to address psychosocial issues. Demonstrates understanding / competency    Patient understands how to develop strategies to promote health/change behavior. Demonstrates understanding / competency      Outcomes   Expected Outcomes Demonstrated interest in learning but significant barriers to change    Future DMSE 4-6 wks    Program Status Completed             Individualized Plan for Diabetes Self-Management Training:   Learning Objective:  Patient will have a greater understanding of diabetes self-management. Patient education plan is to attend individual and/or group sessions per assessed needs and concerns.   Plan:   Patient Instructions  Practice carbohydrate counting.  The following can help with carbohydrate counting. Papers provided Calorie king app Food label Beckey Rutter   I will call you about changing your insulin to carbohydrate ratio.  Currently your pump is not set up to enter carbohydrates as your insulin to carb ratio is 1:1.  We changed your correction factor to 1:50.  This will help when your pump is in manual mode.  Be sure to eat a snack when you are exercising particularly after a class and before a walk.  Keep an eye on your sensor readings when you are exercising.  We will continue to learn more about the sleep mode.  Dr. Lucianne Muss recommended for you to turn off the sleep mode if you are exercising a lot in the afternoon or evening to avoid low blood glucose.  You have been show how to do this.  You state that you know how to do manual corrections when needed.    Expected Outcomes:  Demonstrated interest in learning but significant barriers to change  Education material provided: Food label handouts, Meal plan card, and Carbohydrate  counting sheet of common asian foods  If problems or questions, patient to contact team via:  Phone  Future DSME appointment: prn

## 2022-01-12 ENCOUNTER — Encounter: Payer: Self-pay | Admitting: Endocrinology

## 2022-01-13 ENCOUNTER — Encounter: Payer: Self-pay | Admitting: Internal Medicine

## 2022-01-13 NOTE — Progress Notes (Signed)
Subjective:    Patient ID: Catherine Murphy, female    DOB: [birth year], 66 y.o.   MRN: [id number]      HPI Saleha is here for  Chief Complaint  Patient presents with   Headache    Patient had headache before but does not have one now    dizziness-  she came back from a trip that was very busy last Wednesday.  Thursday last week she had a transient episode of dizziness - she almost felt like she was going to fall.  She has had that in the past but this episode was worse than usual.    Needs blood work - gets it done every 3 months and it has been too long.    Gets stressed easily and is taking a supplement.   Medications and allergies reviewed with patient and updated if appropriate.  Current Outpatient Medications on File Prior to Visit  Medication Sig Dispense Refill   albuterol (VENTOLIN HFA) 108 (90 Base) MCG/ACT inhaler Inhale 2 puffs into the lungs every 6 (six) hours as needed for wheezing or shortness of breath. 8 g 6   azelastine (ASTELIN) 0.1 % nasal spray Place 2 sprays into both nostrils as needed for rhinitis. Use in each nostril as directed     Blood Glucose Monitoring Suppl (ONETOUCH VERIO) w/Device KIT by Does not apply route.     Continuous Blood Gluc Receiver (DEXCOM G6 RECEIVER) DEVI 2 PER YEAR 3 each 2   Continuous Blood Gluc Sensor (DEXCOM G6 SENSOR) MISC Use as directed 3 each 1   Continuous Blood Gluc Transmit (DEXCOM G6 TRANSMITTER) MISC 1 Act by Does not apply route daily. 3 each 1   glucose blood (ONETOUCH VERIO) test strip 1 each by Other route in the morning, at noon, and at bedtime. Use as instructed 300 strip 1   insulin aspart (NOVOLOG) 100 UNIT/ML injection For use in pump, total of 60 units per day 60 mL 3   insulin lispro (HUMALOG KWIKPEN) 100 UNIT/ML KwikPen 3 times a day (just before each meal), 6-10-8 units. 15 mL 1   Insulin Pen Needle (B-D UF III MINI PEN NEEDLES) 31G X 5 MM MISC by Does not apply route.     ipratropium (ATROVENT) 0.03 %  nasal spray Place 2 sprays into both nostrils every 12 (twelve) hours. 30 mL 12   losartan (COZAAR) 50 MG tablet Take 1 tablet (50 mg total) by mouth daily. 90 tablet 1   metFORMIN (GLUCOPHAGE-XR) 500 MG 24 hr tablet TAKE 2 TABLETS BY MOUTH DAILY  WITH BREAKFAST 180 tablet 1   OneTouch Delica Lancets 51O MISC by Does not apply route.     pioglitazone (ACTOS) 15 MG tablet Take 15 mg by mouth daily.     simvastatin (ZOCOR) 10 MG tablet Take 1 tablet (10 mg total) by mouth at bedtime. 90 tablet 1   umeclidinium-vilanterol (ANORO ELLIPTA) 62.5-25 MCG/INH AEPB Inhale 1 puff into the lungs daily as needed. 60 each 6   No current facility-administered medications on file prior to visit.    Review of Systems  Constitutional:  Negative for fever.  Respiratory:  Negative for cough, shortness of breath and wheezing.   Cardiovascular:  Negative for chest pain, palpitations and leg swelling.  Neurological:  Positive for dizziness. Negative for headaches.       Objective:   Vitals:   01/14/22 1355  BP: 140/76  Pulse: 82  Temp: 98.6 F (37 C)  SpO2: 96%  BP Readings from Last 3 Encounters:  01/14/22 140/76  01/06/22 (!) 142/64  10/08/21 (!) 174/86   Wt Readings from Last 3 Encounters:  01/14/22 156 lb (70.8 kg)  01/06/22 158 lb 3.2 oz (71.8 kg)  10/08/21 157 lb 12.8 oz (71.6 kg)   Body mass index is 27.63 kg/m.    Physical Exam Constitutional:      General: She is not in acute distress.    Appearance: Normal appearance.  HENT:     Head: Normocephalic and atraumatic.  Eyes:     Conjunctiva/sclera: Conjunctivae normal.  Cardiovascular:     Rate and Rhythm: Normal rate and regular rhythm.     Heart sounds: Normal heart sounds. No murmur heard. Pulmonary:     Effort: Pulmonary effort is normal. No respiratory distress.     Breath sounds: Normal breath sounds. No wheezing.  Musculoskeletal:     Cervical back: Neck supple.     Right lower leg: No edema.     Left lower leg: No  edema.  Lymphadenopathy:     Cervical: No cervical adenopathy.  Skin:    General: Skin is warm and dry.     Findings: No rash.  Neurological:     Mental Status: She is alert. Mental status is at baseline.  Psychiatric:        Mood and Affect: Mood normal.        Behavior: Behavior normal.            Assessment & Plan:    Dizziness Acute Had an episode just over a week of very transient dizziness when she was checking out from another doctor's appointment Has had this in the past, but this 1 was slightly worse Symptoms only lasted a few seconds Has not had any dizziness or concerning symptoms since then Reassured her this was likely related to an inner ear issue with quick movement Monitor only-no further evaluation is necessary   See Problem List for Assessment and Plan of chronic medical problems.

## 2022-01-14 ENCOUNTER — Ambulatory Visit: Payer: BC Managed Care – PPO | Admitting: Internal Medicine

## 2022-01-14 VITALS — BP 140/76 | HR 82 | Temp 98.6°F | Ht 63.0 in | Wt 156.0 lb

## 2022-01-14 DIAGNOSIS — E785 Hyperlipidemia, unspecified: Secondary | ICD-10-CM | POA: Diagnosis not present

## 2022-01-14 DIAGNOSIS — R42 Dizziness and giddiness: Secondary | ICD-10-CM

## 2022-01-14 DIAGNOSIS — I10 Essential (primary) hypertension: Secondary | ICD-10-CM

## 2022-01-14 DIAGNOSIS — E118 Type 2 diabetes mellitus with unspecified complications: Secondary | ICD-10-CM

## 2022-01-14 LAB — CBC WITH DIFFERENTIAL/PLATELET
Basophils Absolute: 0 10*3/uL (ref 0.0–0.1)
Basophils Relative: 0.7 % (ref 0.0–3.0)
Eosinophils Absolute: 0.1 10*3/uL (ref 0.0–0.7)
Eosinophils Relative: 1.8 % (ref 0.0–5.0)
HCT: 40 % (ref 36.0–46.0)
Hemoglobin: 13.1 g/dL (ref 12.0–15.0)
Lymphocytes Relative: 33.6 % (ref 12.0–46.0)
Lymphs Abs: 1.9 10*3/uL (ref 0.7–4.0)
MCHC: 32.7 g/dL (ref 30.0–36.0)
MCV: 92.6 fl (ref 78.0–100.0)
Monocytes Absolute: 0.4 10*3/uL (ref 0.1–1.0)
Monocytes Relative: 6.9 % (ref 3.0–12.0)
Neutro Abs: 3.3 10*3/uL (ref 1.4–7.7)
Neutrophils Relative %: 57 % (ref 43.0–77.0)
Platelets: 232 10*3/uL (ref 150.0–400.0)
RBC: 4.32 Mil/uL (ref 3.87–5.11)
RDW: 12.5 % (ref 11.5–15.5)
WBC: 5.8 10*3/uL (ref 4.0–10.5)

## 2022-01-14 LAB — COMPREHENSIVE METABOLIC PANEL
ALT: 13 U/L (ref 0–35)
AST: 18 U/L (ref 0–37)
Albumin: 3.8 g/dL (ref 3.5–5.2)
Alkaline Phosphatase: 46 U/L (ref 39–117)
BUN: 21 mg/dL (ref 6–23)
CO2: 28 mEq/L (ref 19–32)
Calcium: 9.2 mg/dL (ref 8.4–10.5)
Chloride: 103 mEq/L (ref 96–112)
Creatinine, Ser: 0.65 mg/dL (ref 0.40–1.20)
GFR: 92.98 mL/min (ref >60.00)
Glucose, Bld: 236 mg/dL — ABNORMAL HIGH (ref 70–99)
Potassium: 4.2 mEq/L (ref 3.5–5.1)
Sodium: 135 mEq/L (ref 135–145)
Total Bilirubin: 0.4 mg/dL (ref 0.2–1.2)
Total Protein: 7.2 g/dL (ref 6.0–8.3)

## 2022-01-14 LAB — LIPID PANEL
Cholesterol: 207 mg/dL — ABNORMAL HIGH (ref 0–200)
HDL: 86.7 mg/dL (ref >39.00)
LDL Cholesterol: 106 mg/dL — ABNORMAL HIGH (ref 0–99)
NonHDL: 120.23
Total CHOL/HDL Ratio: 2
Triglycerides: 69 mg/dL (ref 0.0–149.0)
VLDL: 13.8 mg/dL (ref 0.0–40.0)

## 2022-01-14 LAB — MICROALBUMIN / CREATININE URINE RATIO
Creatinine,U: 45.3 mg/dL
Microalb Creat Ratio: 37.6 mg/g — ABNORMAL HIGH (ref 0.0–30.0)
Microalb, Ur: 17 mg/dL — ABNORMAL HIGH (ref 0.0–1.9)

## 2022-01-14 NOTE — Assessment & Plan Note (Signed)
Chronic Regular exercise and healthy diet encouraged Check lipid panel per her request Continue simvastatin 10 mg daily

## 2022-01-14 NOTE — Patient Instructions (Addendum)
     Blood work was ordered.     Medications changes include :   none    Return for follow up as scheduled.  

## 2022-01-14 NOTE — Assessment & Plan Note (Signed)
Neck Following with Dr. Lucianne Muss She just recently saw him A1c 7.3%, which is an improvement She did have microalbumin checked and she does have an elevated microalbumin/creatinine, which is new She is very concerned about this and wants this repeated today.  Advised this could wait, but she is insistent on having this rechecked today Discussed that this means there may have been some damage to her kidneys and she should work on getting her sugars even better controlled and ideally her blood pressure little bit better controlled as well

## 2022-01-14 NOTE — Assessment & Plan Note (Addendum)
Chronic BP controlled Continue losartan 50 mg daily cmp, CBC

## 2022-01-18 ENCOUNTER — Encounter: Payer: BC Managed Care – PPO | Attending: Endocrinology | Admitting: Dietician

## 2022-01-18 NOTE — Progress Notes (Signed)
Patient is here today with her husband.  They were last seen for some pump setting changes recommended by MD last week.  Patient is happy with the changes and reports that she feels she has better control.  Today we were to change the I:C to 1:6 for breakfast and 1:10 for lunch and dinner so patient can begin entering her carbohydrate intake.  Currently, she is entering a set insulin dose which she adjusts based on the size of the meal. Husband brought in her meal intake for the past week which included how much insulin she gave for each meal.  They did not do carbohydrate counting for this.  Patient became upset during meeting and stated that carbohydrate counting is too stressful for her and she wants to continue bolus as she has been.  She states that she has an upcoming trip to [place] and estimating carbohydrate intake there would be more difficult.  Reminded patient of resources.  Reviewed carbohydrate counting basics.  Discussed best control could be obtained with pump when carbohydrate counting.  Dexcom report reviewed.  Segments were not entered and insulin to carb ratio was not adjusted.    Patient to discuss with MD.  Follow up with pump trainer as needed.  Oran Rein, RD, LDN, CDCES

## 2022-01-28 ENCOUNTER — Other Ambulatory Visit: Payer: Self-pay | Admitting: Internal Medicine

## 2022-01-28 DIAGNOSIS — E119 Type 2 diabetes mellitus without complications: Secondary | ICD-10-CM

## 2022-01-28 DIAGNOSIS — E118 Type 2 diabetes mellitus with unspecified complications: Secondary | ICD-10-CM

## 2022-02-03 DIAGNOSIS — E1165 Type 2 diabetes mellitus with hyperglycemia: Secondary | ICD-10-CM | POA: Diagnosis not present

## 2022-02-08 ENCOUNTER — Telehealth: Payer: Self-pay | Admitting: Internal Medicine

## 2022-02-08 MED ORDER — ONETOUCH DELICA LANCETS 33G MISC: 0 refills | Status: DC

## 2022-02-08 MED ORDER — ONETOUCH VERIO W/DEVICE KIT: PACK | Status: DC

## 2022-02-08 NOTE — Telephone Encounter (Signed)
Pt is requesting a refill on OneTouch Delica Lancets 99M MISC. Pt also needs Blood Glucose Monitoring Suppl (ONETOUCH VERIO) w/Device KIT. Pt stated the current Glucose monitor she has is broken.   LOV: 09/28/21 ROV: 04/28/22   [id number] [place] [address]:  [phone] Fax:  [phone]

## 2022-02-11 ENCOUNTER — Other Ambulatory Visit: Payer: Self-pay | Admitting: Internal Medicine

## 2022-02-14 ENCOUNTER — Other Ambulatory Visit: Payer: Self-pay | Admitting: Internal Medicine

## 2022-02-14 DIAGNOSIS — E785 Hyperlipidemia, unspecified: Secondary | ICD-10-CM

## 2022-02-18 MED ORDER — ONETOUCH VERIO W/DEVICE KIT: PACK | Status: DC

## 2022-02-18 NOTE — Addendum Note (Signed)
Addended by: Darryll Capers on: 02/18/2022 09:19 AM   Modules accepted: Orders

## 2022-02-18 NOTE — Telephone Encounter (Signed)
Pt stated he got the lancets but not the Blood Glucose Monitoring Suppl (ONETOUCH VERIO) w/Device KIT. Pt would like it sent to  [id number] - [place], [place] - [address] AT [address] Phone:  [phone]  Fax:  [phone]     

## 2022-02-18 NOTE — Telephone Encounter (Signed)
Rx sent again

## 2022-02-21 DIAGNOSIS — E118 Type 2 diabetes mellitus with unspecified complications: Secondary | ICD-10-CM | POA: Diagnosis not present

## 2022-02-23 ENCOUNTER — Other Ambulatory Visit: Payer: Self-pay | Admitting: Internal Medicine

## 2022-02-23 DIAGNOSIS — E118 Type 2 diabetes mellitus with unspecified complications: Secondary | ICD-10-CM

## 2022-02-23 MED ORDER — ONETOUCH VERIO W/DEVICE KIT: 1.0000 | PACK | Freq: Two times a day (BID) | 1 refills | Status: AC | PRN

## 2022-02-23 MED ORDER — ONETOUCH VERIO W/DEVICE KIT
1.0000 | PACK | Freq: Two times a day (BID) | 1 refills | Status: DC | PRN
Start: 2022-02-23 — End: 2022-02-23

## 2022-03-07 DIAGNOSIS — E1165 Type 2 diabetes mellitus with hyperglycemia: Secondary | ICD-10-CM | POA: Diagnosis not present

## 2022-03-22 ENCOUNTER — Other Ambulatory Visit: Payer: Self-pay | Admitting: Internal Medicine

## 2022-03-22 DIAGNOSIS — E118 Type 2 diabetes mellitus with unspecified complications: Secondary | ICD-10-CM

## 2022-03-22 DIAGNOSIS — I10 Essential (primary) hypertension: Secondary | ICD-10-CM

## 2022-03-23 ENCOUNTER — Other Ambulatory Visit: Payer: Self-pay

## 2022-03-23 ENCOUNTER — Other Ambulatory Visit: Payer: Self-pay | Admitting: Endocrinology

## 2022-03-23 DIAGNOSIS — E1065 Type 1 diabetes mellitus with hyperglycemia: Secondary | ICD-10-CM

## 2022-03-23 MED ORDER — INSULIN ASPART 100 UNIT/ML IJ SOLN: INTRAMUSCULAR | 3 refills | Status: DC

## 2022-03-25 ENCOUNTER — Telehealth: Payer: Self-pay

## 2022-03-25 DIAGNOSIS — E1065 Type 1 diabetes mellitus with hyperglycemia: Secondary | ICD-10-CM

## 2022-03-25 NOTE — Telephone Encounter (Signed)
Per pharmacy Novolog is not covered.

## 2022-03-28 ENCOUNTER — Ambulatory Visit: Payer: BC Managed Care – PPO | Admitting: Internal Medicine

## 2022-03-29 MED ORDER — NOVOLOG 100 UNIT/ML IJ SOLN: INTRAMUSCULAR | 3 refills | Status: DC

## 2022-03-29 MED ORDER — INSULIN ASPART 100 UNIT/ML IJ SOLN: INTRAMUSCULAR | 3 refills | Status: DC

## 2022-03-29 NOTE — Telephone Encounter (Signed)
Spoke with patient states Humalog is not covered either. I called pharmacy and they were running original Rx as generic and insurance will only cover brand name.

## 2022-03-29 NOTE — Addendum Note (Signed)
Addended by: Eliseo Squires on: 03/29/2022 04:39 PM   Modules accepted: Orders

## 2022-03-29 NOTE — Addendum Note (Signed)
Addended by: Eliseo Squires on: 03/29/2022 04:34 PM   Modules accepted: Orders

## 2022-04-05 ENCOUNTER — Ambulatory Visit: Payer: BC Managed Care – PPO | Admitting: Internal Medicine

## 2022-04-06 DIAGNOSIS — E1165 Type 2 diabetes mellitus with hyperglycemia: Secondary | ICD-10-CM | POA: Diagnosis not present

## 2022-04-26 ENCOUNTER — Other Ambulatory Visit: Payer: Self-pay | Admitting: Internal Medicine

## 2022-04-26 DIAGNOSIS — Z1231 Encounter for screening mammogram for malignant neoplasm of breast: Secondary | ICD-10-CM

## 2022-04-28 ENCOUNTER — Ambulatory Visit (INDEPENDENT_AMBULATORY_CARE_PROVIDER_SITE_OTHER): Payer: BC Managed Care – PPO

## 2022-04-28 ENCOUNTER — Ambulatory Visit: Payer: BC Managed Care – PPO | Admitting: Internal Medicine

## 2022-04-28 ENCOUNTER — Encounter: Payer: Self-pay | Admitting: Internal Medicine

## 2022-04-28 VITALS — BP 138/88 | HR 78 | Temp 98.3°F | Ht 63.0 in | Wt 154.0 lb

## 2022-04-28 DIAGNOSIS — R052 Subacute cough: Secondary | ICD-10-CM | POA: Insufficient documentation

## 2022-04-28 DIAGNOSIS — Z23 Encounter for immunization: Secondary | ICD-10-CM

## 2022-04-28 DIAGNOSIS — R61 Generalized hyperhidrosis: Secondary | ICD-10-CM | POA: Diagnosis not present

## 2022-04-28 DIAGNOSIS — J4530 Mild persistent asthma, uncomplicated: Secondary | ICD-10-CM | POA: Diagnosis not present

## 2022-04-28 DIAGNOSIS — R059 Cough, unspecified: Secondary | ICD-10-CM | POA: Diagnosis not present

## 2022-04-28 MED ORDER — ALBUTEROL SULFATE HFA 108 (90 BASE) MCG/ACT IN AERS: 2.0000 | INHALATION_SPRAY | Freq: Four times a day (QID) | RESPIRATORY_TRACT | 5 refills | Status: DC | PRN

## 2022-04-28 MED ORDER — ARNUITY ELLIPTA 200 MCG/ACT IN AEPB: 1.0000 | INHALATION_SPRAY | Freq: Every day | RESPIRATORY_TRACT | 1 refills | Status: DC

## 2022-04-28 MED ORDER — ARNUITY ELLIPTA 100 MCG/ACT IN AEPB: 1.0000 | INHALATION_SPRAY | Freq: Every day | RESPIRATORY_TRACT | 1 refills | Status: DC

## 2022-04-28 NOTE — Patient Instructions (Signed)
Asthma, Adult ? ?Asthma is a long-term (chronic) condition that causes recurrent episodes in which the lower airways in the lungs become tight and narrow. The narrowing is caused by inflammation and tightening of the smooth muscle around the lower airways. ?Asthma episodes, also called asthma attacks or asthma flares, may cause coughing, making high-pitched whistling sounds when you breathe, most often when you breathe out (wheezing), shortness of breath, and chest pain. The airways may produce extra mucus caused by the inflammation and irritation. During an attack, it can be difficult to breathe. Asthma attacks can range from minor to life-threatening. ?Asthma cannot be cured, but medicines and lifestyle changes can help control it and treat acute attacks. It is important to keep your asthma well controlled so the condition does not interfere with your daily life. ?What are the causes? ?This condition is believed to be caused by inherited (genetic) and environmental factors, but its exact cause is not known. ?What can trigger an asthma attack? ?Many things can bring on an asthma attack or make symptoms worse. These triggers are different for every person. Common triggers include: ?Allergens and irritants like mold, dust, pet dander, cockroaches, pollen, air pollution, and chemical odors. ?Cigarette smoke. ?Weather changes and cold air. ?Stress and strong emotional responses such as crying or laughing hard. ?Certain medications such as aspirin or beta blockers. ?Infections and inflammatory conditions, such as the flu, a cold, pneumonia, or inflammation of the nasal membranes (rhinitis). ?Gastroesophageal reflux disease (GERD). ?What are the signs or symptoms? ?Symptoms may occur right after exposure to an asthma trigger or hours later and can vary by person. Common signs and symptoms include: ?Wheezing. ?Trouble breathing (shortness of breath). ?Excessive nighttime or early morning coughing. ?Chest  tightness. ?Tiredness (fatigue) with minimal activity. ?Difficulty talking in complete sentences. ?Poor exercise tolerance. ?How is this diagnosed? ?This condition is diagnosed based on: ?A physical exam and your medical history. ?Tests, which may include: ?Lung function studies to evaluate the flow of air in your lungs. ?Allergy tests. ?Imaging tests, such as X-rays. ?How is this treated? ?There is no cure, but symptoms can be controlled with proper treatment. Treatment usually involves: ?Identifying and avoiding your asthma triggers. ?Inhaled medicines. Two types are commonly used to treat asthma, depending on severity: ?Controller medicines. These help prevent asthma symptoms from occurring. They are taken every day. ?Fast-acting reliever or rescue medicines. These quickly relieve asthma symptoms. They are used as needed and provide short-term relief. ?Using other medicines, such as: ?Allergy medicines, such as antihistamines, if your asthma attacks are triggered by allergens. ?Immune medicines (immunomodulators). These are medicines that help control the immune system. ?Using supplemental oxygen. This is only needed during a severe episode. ?Creating an asthma action plan. An asthma action plan is a written plan for managing and treating your asthma attacks. This plan includes: ?A list of your asthma triggers and how to avoid them. ?Information about when medicines should be taken and when their dosage should be changed. ?Instructions about using a device called a peak flow meter. A peak flow meter measures how well the lungs are working and the severity of your asthma. It helps you monitor your condition. ?Follow these instructions at home: ?Take over-the-counter and prescription medicines only as told by your health care provider. ?Stay up to date on all vaccinations as recommended by your healthcare provider, including vaccines for the flu and pneumonia. ?Use a peak flow meter and keep track of your peak flow  readings. ?Understand and use your asthma   action plan to address any asthma flares. ?Do not smoke or allow anyone to smoke in your home. ?Contact a health care provider if: ?You have wheezing, shortness of breath, or a cough that is not responding to medicines. ?Your medicines are causing side effects, such as a rash, itching, swelling, or trouble breathing. ?You need to use a reliever medicine more than 2-3 times a week. ?Your peak flow reading is still at 50-79% of your personal best after following your action plan for 1 hour. ?You have a fever and shortness of breath. ?Get help right away if: ?You are getting worse and do not respond to treatment during an asthma attack. ?You are short of breath when at rest or when doing very little physical activity. ?You have difficulty eating, drinking, or talking. ?You have chest pain or tightness. ?You develop a fast heartbeat or palpitations. ?You have a bluish color to your lips or fingernails. ?You are light-headed or dizzy, or you faint. ?Your peak flow reading is less than 50% of your personal best. ?You feel too tired to breathe normally. ?These symptoms may be an emergency. Get help right away. Call 911. ?Do not wait to see if the symptoms will go away. ?Do not drive yourself to the hospital. ?Summary ?Asthma is a long-term (chronic) condition that causes recurrent episodes in which the airways become tight and narrow. Asthma episodes, also called asthma attacks or asthma flares, can cause coughing, wheezing, shortness of breath, and chest pain. ?Asthma cannot be cured, but medicines and lifestyle changes can help keep it well controlled and prevent asthma flares. ?Make sure you understand how to avoid triggers and how and when to use your medicines. ?Asthma attacks can range from minor to life-threatening. Get help right away if you have an asthma attack and do not respond to treatment with your usual rescue medicines. ?This information is not intended to replace  advice given to you by your health care provider. Make sure you discuss any questions you have with your health care provider. ?Document Revised: 05/05/2021 Document Reviewed: 04/26/2021 ?Elsevier Patient Education ? 2023 [employer] ? ?

## 2022-04-28 NOTE — Progress Notes (Signed)
Subjective:  Patient ID: Catherine Murphy, female    DOB: [birth year]  Age: 66 y.o. MRN: [id number]  CC: Cough and Asthma   HPI Catherine Murphy presents for f/up -  She traveled to [place] about 18 days ago and while she was there she developed a nonproductive cough, night sweats, chest tightness, and wheezing.  She denies diaphoresis, fever, chills, or weight loss.  Outpatient Medications Prior to Visit  Medication Sig Dispense Refill   azelastine (ASTELIN) 0.1 % nasal spray Place 2 sprays into both nostrils as needed for rhinitis. Use in each nostril as directed     Blood Glucose Monitoring Suppl (ONETOUCH VERIO) w/Device KIT Inject 1 Act into the skin 2 (two) times daily as needed. Use to monitor blood sugar twice a day. E11.9 1 kit 1   Insulin Pen Needle (B-D UF III MINI PEN NEEDLES) 31G X 5 MM MISC by Does not apply route.     ipratropium (ATROVENT) 0.03 % nasal spray Place 2 sprays into both nostrils every 12 (twelve) hours. 30 mL 12   losartan (COZAAR) 50 MG tablet TAKE 1 TABLET BY MOUTH EVERY DAY 90 tablet 0   metFORMIN (GLUCOPHAGE-XR) 500 MG 24 hr tablet TAKE 2 TABLETS BY MOUTH DAILY  WITH BREAKFAST 180 tablet 1   NOVOLOG 100 UNIT/ML injection For use in pump, total of 60 units per day 60 mL 3   OneTouch Delica Lancets 94B MISC Use to check blood sugar twice a day. E11.9 100 each 0   ONETOUCH VERIO test strip USE 1 STRIP IN THE MORNING, AT NOON, AND AT BEDTIME AS DIRECTED 300 strip 1   pioglitazone (ACTOS) 15 MG tablet Take 15 mg by mouth daily.     simvastatin (ZOCOR) 10 MG tablet TAKE 1 TABLET BY MOUTH AT  BEDTIME 90 tablet 0   albuterol (VENTOLIN HFA) 108 (90 Base) MCG/ACT inhaler Inhale 2 puffs into the lungs every 6 (six) hours as needed for wheezing or shortness of breath. 8 g 6   umeclidinium-vilanterol (ANORO ELLIPTA) 62.5-25 MCG/INH AEPB Inhale 1 puff into the lungs daily as needed. 60 each 6   No facility-administered medications prior to visit.    ROS Review of  Systems  Constitutional:  Negative for chills, diaphoresis, fatigue and fever.  HENT: Negative.  Negative for trouble swallowing.   Eyes: Negative.   Respiratory:  Positive for cough, chest tightness and wheezing. Negative for shortness of breath.   Cardiovascular:  Negative for chest pain, palpitations and leg swelling.  Gastrointestinal:  Negative for abdominal pain, constipation, diarrhea, nausea and vomiting.  Endocrine: Negative.   Genitourinary: Negative.  Negative for difficulty urinating.  Musculoskeletal: Negative.   Skin: Negative.   Neurological: Negative.  Negative for dizziness, weakness and light-headedness.  Hematological:  Negative for adenopathy. Does not bruise/bleed easily.    Objective:  BP 138/88 (BP Location: Left Arm, Patient Position: Sitting, Cuff Size: Large)   Pulse 78   Temp 98.3 F (36.8 C) (Oral)   Ht _0  (1.6 m)   Wt 154 lb (69.9 kg)   SpO2 95%   BMI 27.28 kg/m   BP Readings from Last 3 Encounters:  04/28/22 138/88  01/14/22 140/76  01/06/22 (!) 142/64    Wt Readings from Last 3 Encounters:  04/28/22 154 lb (69.9 kg)  01/14/22 156 lb (70.8 kg)  01/06/22 158 lb 3.2 oz (71.8 kg)    Physical Exam Vitals reviewed.  Constitutional:      Appearance: She is  not ill-appearing.  HENT:     Nose: Nose normal.  Eyes:     General: No scleral icterus.    Conjunctiva/sclera: Conjunctivae normal.  Cardiovascular:     Rate and Rhythm: Normal rate and regular rhythm.     Heart sounds: No murmur heard. Pulmonary:     Effort: Pulmonary effort is normal.     Breath sounds: No stridor. No wheezing, rhonchi or rales.  Abdominal:     General: Abdomen is flat.     Palpations: There is no mass.     Tenderness: There is no abdominal tenderness. There is no guarding.     Hernia: No hernia is present.  Musculoskeletal:        General: Normal range of motion.     Cervical back: Neck supple.     Right lower leg: No edema.     Left lower leg: No edema.   Lymphadenopathy:     Cervical: No cervical adenopathy.  Skin:    General: Skin is warm and dry.  Neurological:     General: No focal deficit present.     Mental Status: She is alert.  Psychiatric:        Mood and Affect: Mood normal.        Behavior: Behavior normal.     Lab Results  Component Value Date   WBC 5.8 01/14/2022   HGB 13.1 01/14/2022   HCT 40.0 01/14/2022   PLT 232.0 01/14/2022   GLUCOSE 236 (H) 01/14/2022   CHOL 207 (H) 01/14/2022   TRIG 69.0 01/14/2022   HDL 86.70 01/14/2022   LDLCALC 106 (H) 01/14/2022   ALT 13 01/14/2022   AST 18 01/14/2022   NA 135 01/14/2022   K 4.2 01/14/2022   CL 103 01/14/2022   CREATININE 0.65 01/14/2022   BUN 21 01/14/2022   CO2 28 01/14/2022   TSH 3.66 03/24/2021   HGBA1C 7.3 (A) 01/06/2022   MICROALBUR 17.0 (H) 01/14/2022    MM 3D SCREEN BREAST BILATERAL  Result Date: 06/16/2021 CLINICAL DATA:  Screening. EXAM: DIGITAL SCREENING BILATERAL MAMMOGRAM WITH TOMOSYNTHESIS AND CAD TECHNIQUE: Bilateral screening digital craniocaudal and mediolateral oblique mammograms were obtained. Bilateral screening digital breast tomosynthesis was performed. The images were evaluated with computer-aided detection. COMPARISON:  Previous exam(s). ACR Breast Density Category d: The breast tissue is extremely dense, which lowers the sensitivity of mammography FINDINGS: There are no findings suspicious for malignancy. IMPRESSION: No mammographic evidence of malignancy. A result letter of this screening mammogram will be mailed directly to the patient. RECOMMENDATION: Screening mammogram in one year. (Code:SM-B-01Y) BI-RADS CATEGORY  1: Negative. Electronically Signed   By: Nolon Nations M.D.   On: 06/16/2021 16:18   DG Chest 2 View  Result Date: 04/28/2022 CLINICAL DATA:  Cough and congestion times 10 days EXAM: CHEST - 2 VIEW COMPARISON:  Chest radiograph June 29, 2021 FINDINGS: The heart size and mediastinal contours are within normal limits.  Chronic bronchitic lung changes with pulmonary hyperinflation. No focal airspace consolidation or pleural effusion. The visualized skeletal structures are unremarkable. IMPRESSION: No acute cardiopulmonary disease. Electronically Signed   By: Dahlia Bailiff M.D.   On: 04/28/2022 11:09     Assessment & Plan:   Dineen was seen today for cough and asthma.  Diagnoses and all orders for this visit:  Subacute cough- Her chest x-ray is negative for mass or infiltrate.  She has probably had a viral URI. -     DG Chest 2 View; Future  Night  sweats -     DG Chest 2 View; Future  Mild persistent asthma without complication- Will treat with an ICS. -     Discontinue: Fluticasone Furoate (ARNUITY ELLIPTA) 100 MCG/ACT AEPB; Inhale 1 puff into the lungs daily. -     Fluticasone Furoate (ARNUITY ELLIPTA) 200 MCG/ACT AEPB; Inhale 1 puff into the lungs daily. -     albuterol (VENTOLIN HFA) 108 (90 Base) MCG/ACT inhaler; Inhale 2 puffs into the lungs every 6 (six) hours as needed for wheezing or shortness of breath.  Other orders -     Flu Vaccine QUAD 6+ mos PF IM (Fluarix Quad PF)   I have discontinued Merlean C. Langenfeld "Helen"'s Anoro Ellipta and Arnuity Ellipta. I am also having her start on Hostetter. Additionally, I am having her maintain her B-D UF III MINI PEN NEEDLES, pioglitazone, azelastine, ipratropium, metFORMIN, OneTouch Verio, OneTouch Delica Lancets 61U, simvastatin, OneTouch Verio, losartan, NovoLOG, and albuterol.  Meds ordered this encounter  Medications   DISCONTD: Fluticasone Furoate (ARNUITY ELLIPTA) 100 MCG/ACT AEPB    Sig: Inhale 1 puff into the lungs daily.    Dispense:  120 each    Refill:  1   Fluticasone Furoate (ARNUITY ELLIPTA) 200 MCG/ACT AEPB    Sig: Inhale 1 puff into the lungs daily.    Dispense:  120 each    Refill:  1   albuterol (VENTOLIN HFA) 108 (90 Base) MCG/ACT inhaler    Sig: Inhale 2 puffs into the lungs every 6 (six) hours as needed for wheezing or  shortness of breath.    Dispense:  18 g    Refill:  5     Follow-up: Return in about 3 months (around 07/28/2022).  Scarlette Calico, MD

## 2022-04-30 ENCOUNTER — Other Ambulatory Visit: Payer: Self-pay | Admitting: Internal Medicine

## 2022-04-30 DIAGNOSIS — E118 Type 2 diabetes mellitus with unspecified complications: Secondary | ICD-10-CM

## 2022-04-30 DIAGNOSIS — I10 Essential (primary) hypertension: Secondary | ICD-10-CM

## 2022-05-02 MED ORDER — LOSARTAN POTASSIUM 50 MG PO TABS: 50.0000 mg | ORAL_TABLET | Freq: Every day | ORAL | 0 refills | Status: DC

## 2022-05-13 ENCOUNTER — Other Ambulatory Visit: Payer: Self-pay | Admitting: Internal Medicine

## 2022-05-13 DIAGNOSIS — Z794 Long term (current) use of insulin: Secondary | ICD-10-CM

## 2022-05-13 DIAGNOSIS — E118 Type 2 diabetes mellitus with unspecified complications: Secondary | ICD-10-CM

## 2022-05-26 ENCOUNTER — Other Ambulatory Visit: Payer: Self-pay | Admitting: Internal Medicine

## 2022-05-26 ENCOUNTER — Telehealth: Payer: Self-pay | Admitting: Internal Medicine

## 2022-05-26 DIAGNOSIS — E118 Type 2 diabetes mellitus with unspecified complications: Secondary | ICD-10-CM

## 2022-05-26 MED ORDER — DEXCOM G7 SENSOR MISC: 1.0000 | Freq: Every day | 1 refills | Status: DC

## 2022-05-26 MED ORDER — DEXCOM G7 RECEIVER DEVI: 1.0000 | Freq: Every day | 1 refills | Status: AC

## 2022-05-26 NOTE — Telephone Encounter (Signed)
Patient needs a prescription for a dexcom sensor  Please send to [facility] in [place] -   Next Visit:  07/28/2022

## 2022-05-26 NOTE — Telephone Encounter (Signed)
Pt says it is urgent because she is out and cannot use the insulin without the sensor.  Pt called to report she has to change sensor every 10 days. Patient says she uses Dexcom G6 is what patient needs, not G7. Pt says she is out, she has no more.  DEXCOM G6 with a 3 MONTH SUPPLY WITH 4 REFILLS   [facility], [place] [address]  [place], [place] [address]  Phone:  [phone]  Fax:  [phone]

## 2022-05-26 NOTE — Telephone Encounter (Signed)
Please advise as Dexcom is not listed in her med list.

## 2022-05-27 ENCOUNTER — Telehealth: Payer: Self-pay | Admitting: Internal Medicine

## 2022-05-27 NOTE — Telephone Encounter (Signed)
[facility] called to see if provider received and completed the form they faxed over.

## 2022-05-27 NOTE — Telephone Encounter (Signed)
Forms were placed on PCPs desk for review

## 2022-05-30 ENCOUNTER — Telehealth: Payer: Self-pay

## 2022-05-30 ENCOUNTER — Other Ambulatory Visit: Payer: Self-pay

## 2022-05-30 DIAGNOSIS — E118 Type 2 diabetes mellitus with unspecified complications: Secondary | ICD-10-CM | POA: Diagnosis not present

## 2022-05-30 DIAGNOSIS — E1065 Type 1 diabetes mellitus with hyperglycemia: Secondary | ICD-10-CM

## 2022-05-30 MED ORDER — DEXCOM G6 TRANSMITTER MISC: 2 refills | Status: AC

## 2022-05-30 MED ORDER — DEXCOM G6 SENSOR MISC: 2 refills | Status: AC

## 2022-05-30 NOTE — Telephone Encounter (Signed)
Forms has been signed and faxed back

## 2022-05-30 NOTE — Telephone Encounter (Signed)
Per Epic requested Dexcom Rx's were sent by Endocrinology Dr. Dwyane Dee.

## 2022-05-30 NOTE — Telephone Encounter (Signed)
[facility] called to request chart notes be sent to them. Most recent faxed

## 2022-05-30 NOTE — Telephone Encounter (Signed)
Patient called back and said this is urgent she is out completely

## 2022-05-31 ENCOUNTER — Ambulatory Visit: Payer: BC Managed Care – PPO

## 2022-05-31 NOTE — Telephone Encounter (Signed)
Patient called requesting chart notes be sent to [facility] at [phone]. Sent

## 2022-06-01 ENCOUNTER — Ambulatory Visit
Admission: RE | Admit: 2022-06-01 | Discharge: 2022-06-01 | Disposition: A | Discharge status: Home / Self Care | Payer: BC Managed Care – PPO | Source: Ambulatory Visit | Attending: Internal Medicine | Admitting: Internal Medicine

## 2022-06-01 DIAGNOSIS — Z1231 Encounter for screening mammogram for malignant neoplasm of breast: Secondary | ICD-10-CM | POA: Diagnosis not present

## 2022-06-01 DIAGNOSIS — E1065 Type 1 diabetes mellitus with hyperglycemia: Secondary | ICD-10-CM | POA: Diagnosis not present

## 2022-06-06 ENCOUNTER — Other Ambulatory Visit: Payer: Self-pay | Admitting: Internal Medicine

## 2022-06-06 DIAGNOSIS — R928 Other abnormal and inconclusive findings on diagnostic imaging of breast: Secondary | ICD-10-CM

## 2022-06-13 DIAGNOSIS — E1165 Type 2 diabetes mellitus with hyperglycemia: Secondary | ICD-10-CM | POA: Diagnosis not present

## 2022-06-14 ENCOUNTER — Ambulatory Visit
Admission: RE | Admit: 2022-06-14 | Discharge: 2022-06-14 | Disposition: A | Discharge status: Home / Self Care | Payer: BC Managed Care – PPO | Source: Ambulatory Visit | Attending: Internal Medicine | Admitting: Internal Medicine

## 2022-06-14 DIAGNOSIS — R921 Mammographic calcification found on diagnostic imaging of breast: Secondary | ICD-10-CM | POA: Diagnosis not present

## 2022-06-14 DIAGNOSIS — R928 Other abnormal and inconclusive findings on diagnostic imaging of breast: Secondary | ICD-10-CM

## 2022-07-17 ENCOUNTER — Other Ambulatory Visit: Payer: Self-pay | Admitting: Pulmonary Disease

## 2022-07-17 DIAGNOSIS — J4520 Mild intermittent asthma, uncomplicated: Secondary | ICD-10-CM

## 2022-07-18 ENCOUNTER — Other Ambulatory Visit: Payer: BC Managed Care – PPO

## 2022-07-21 ENCOUNTER — Ambulatory Visit: Payer: BC Managed Care – PPO | Admitting: Endocrinology

## 2022-07-28 ENCOUNTER — Ambulatory Visit (INDEPENDENT_AMBULATORY_CARE_PROVIDER_SITE_OTHER): Payer: Medicare Other

## 2022-07-28 ENCOUNTER — Encounter: Payer: Self-pay | Admitting: Internal Medicine

## 2022-07-28 ENCOUNTER — Ambulatory Visit (INDEPENDENT_AMBULATORY_CARE_PROVIDER_SITE_OTHER): Payer: Medicare Other | Admitting: Internal Medicine

## 2022-07-28 VITALS — BP 136/68 | HR 72 | Temp 98.5°F | Ht 63.0 in | Wt 152.0 lb

## 2022-07-28 DIAGNOSIS — J4530 Mild persistent asthma, uncomplicated: Secondary | ICD-10-CM | POA: Diagnosis not present

## 2022-07-28 DIAGNOSIS — G8929 Other chronic pain: Secondary | ICD-10-CM | POA: Diagnosis not present

## 2022-07-28 DIAGNOSIS — N182 Chronic kidney disease, stage 2 (mild): Secondary | ICD-10-CM

## 2022-07-28 DIAGNOSIS — E1129 Type 2 diabetes mellitus with other diabetic kidney complication: Secondary | ICD-10-CM

## 2022-07-28 DIAGNOSIS — M5441 Lumbago with sciatica, right side: Secondary | ICD-10-CM

## 2022-07-28 DIAGNOSIS — M5442 Lumbago with sciatica, left side: Secondary | ICD-10-CM

## 2022-07-28 DIAGNOSIS — E118 Type 2 diabetes mellitus with unspecified complications: Secondary | ICD-10-CM

## 2022-07-28 DIAGNOSIS — I10 Essential (primary) hypertension: Secondary | ICD-10-CM

## 2022-07-28 DIAGNOSIS — R809 Proteinuria, unspecified: Secondary | ICD-10-CM

## 2022-07-28 DIAGNOSIS — M5136 Other intervertebral disc degeneration, lumbar region: Secondary | ICD-10-CM

## 2022-07-28 LAB — BASIC METABOLIC PANEL
BUN: 20 mg/dL (ref 6–23)
CO2: 28 mEq/L (ref 19–32)
Calcium: 9.2 mg/dL (ref 8.4–10.5)
Chloride: 104 mEq/L (ref 96–112)
Creatinine, Ser: 0.61 mg/dL (ref 0.40–1.20)
GFR: 94.06 mL/min (ref >60.00)
Glucose, Bld: 127 mg/dL — ABNORMAL HIGH (ref 70–99)
Potassium: 4 mEq/L (ref 3.5–5.1)
Sodium: 139 mEq/L (ref 135–145)

## 2022-07-28 LAB — URINALYSIS, ROUTINE W REFLEX MICROSCOPIC
Bilirubin Urine: NEGATIVE
Ketones, ur: NEGATIVE
Leukocytes,Ua: NEGATIVE
Nitrite: NEGATIVE
Specific Gravity, Urine: 1.01 (ref 1.000–1.030)
Total Protein, Urine: 30 — AB
Urine Glucose: NEGATIVE
Urobilinogen, UA: 0.2 (ref 0.0–1.0)
pH: 6 (ref 5.0–8.0)

## 2022-07-28 LAB — MICROALBUMIN / CREATININE URINE RATIO
Creatinine,U: 38.1 mg/dL
Microalb Creat Ratio: 65.8 mg/g — ABNORMAL HIGH (ref 0.0–30.0)
Microalb, Ur: 25.1 mg/dL — ABNORMAL HIGH (ref 0.0–1.9)

## 2022-07-28 LAB — HEMOGLOBIN A1C: Hgb A1c MFr Bld: 7.5 % — ABNORMAL HIGH (ref 4.6–6.5)

## 2022-07-28 MED ORDER — UMECLIDINIUM-VILANTEROL 62.5-25 MCG/ACT IN AEPB: 1.0000 | INHALATION_SPRAY | Freq: Every day | RESPIRATORY_TRACT | 1 refills | Status: DC

## 2022-07-28 NOTE — Progress Notes (Signed)
Subjective:  Patient ID: Catherine Murphy, female    DOB: 07/03/1956  Age: 66 y.o. MRN: [id number]  CC: Diabetes, Hypertension, and Back Pain   HPI Jaquesha Jayliana Valencia presents for f/up -  She complains of LBP that radiates into both thighs for > 6 months.  She is active and denies chest pain, shortness of breath, diaphoresis, or polys.  Outpatient Medications Prior to Visit  Medication Sig Dispense Refill   albuterol (VENTOLIN HFA) 108 (90 Base) MCG/ACT inhaler Inhale 2 puffs into the lungs every 6 (six) hours as needed for wheezing or shortness of breath. 18 g 5   azelastine (ASTELIN) 0.1 % nasal spray Place 2 sprays into both nostrils as needed for rhinitis. Use in each nostril as directed     Blood Glucose Monitoring Suppl (ONETOUCH VERIO) w/Device KIT Inject 1 Act into the skin 2 (two) times daily as needed. Use to monitor blood sugar twice a day. E11.9 1 kit 1   Continuous Blood Gluc Receiver (DEXCOM G7 RECEIVER) DEVI 1 Act by Does not apply route daily. 9 each 1   Continuous Blood Gluc Sensor (DEXCOM G6 SENSOR) MISC Change every 10 days 9 each 2   Continuous Blood Gluc Transmit (DEXCOM G6 TRANSMITTER) MISC Change every 3 months 1 each 2   Fluticasone Furoate (ARNUITY ELLIPTA) 200 MCG/ACT AEPB Inhale 1 puff into the lungs daily. 120 each 1   Insulin Pen Needle (B-D UF III MINI PEN NEEDLES) 31G X 5 MM MISC by Does not apply route.     ipratropium (ATROVENT) 0.03 % nasal spray Place 2 sprays into both nostrils every 12 (twelve) hours. 30 mL 12   metFORMIN (GLUCOPHAGE-XR) 500 MG 24 hr tablet TAKE 2 TABLETS BY MOUTH DAILY  WITH BREAKFAST 180 tablet 0   NOVOLOG 100 UNIT/ML injection For use in pump, total of 60 units per day 60 mL 3   OneTouch Delica Lancets 74J MISC Use to check blood sugar twice a day. E11.9 100 each 0   ONETOUCH VERIO test strip USE 1 STRIP IN THE MORNING, AT NOON, AND AT BEDTIME AS DIRECTED 300 strip 1   pioglitazone (ACTOS) 15 MG tablet Take 15 mg by mouth daily.      simvastatin (ZOCOR) 10 MG tablet TAKE 1 TABLET BY MOUTH AT  BEDTIME 90 tablet 0   losartan (COZAAR) 50 MG tablet Take 1 tablet (50 mg total) by mouth daily. 90 tablet 0   No facility-administered medications prior to visit.    ROS Review of Systems  Constitutional: Negative.  Negative for appetite change, diaphoresis, fatigue and unexpected weight change.  HENT: Negative.    Eyes: Negative.   Respiratory:  Negative for cough, chest tightness, shortness of breath and wheezing.   Cardiovascular:  Negative for chest pain, palpitations and leg swelling.  Gastrointestinal:  Negative for abdominal pain, diarrhea, nausea and vomiting.  Endocrine: Negative.   Genitourinary: Negative.  Negative for difficulty urinating.  Musculoskeletal:  Positive for back pain. Negative for myalgias.  Skin: Negative.   Neurological: Negative.  Negative for dizziness, weakness and numbness.  Hematological:  Negative for adenopathy. Does not bruise/bleed easily.  Psychiatric/Behavioral: Negative.      Objective:  BP 136/68 (BP Location: Left Arm, Patient Position: Sitting, Cuff Size: Large)   Pulse 72   Temp 98.5 F (36.9 C) (Oral)   Ht _0  (1.6 m)   Wt 152 lb (68.9 kg)   SpO2 95%   BMI 26.93 kg/m   BP Readings  from Last 3 Encounters:  07/28/22 136/68  04/28/22 138/88  01/14/22 140/76    Wt Readings from Last 3 Encounters:  07/28/22 152 lb (68.9 kg)  04/28/22 154 lb (69.9 kg)  01/14/22 156 lb (70.8 kg)    Physical Exam Vitals reviewed.  HENT:     Nose: Nose normal.     Mouth/Throat:     Mouth: Mucous membranes are moist.  Eyes:     General: No scleral icterus.    Conjunctiva/sclera: Conjunctivae normal.  Cardiovascular:     Rate and Rhythm: Normal rate and regular rhythm.     Heart sounds: No murmur heard. Pulmonary:     Effort: Pulmonary effort is normal.     Breath sounds: No stridor. No wheezing, rhonchi or rales.  Abdominal:     General: Abdomen is flat.     Palpations:  There is no mass.     Tenderness: There is no abdominal tenderness. There is no guarding.     Hernia: No hernia is present.  Musculoskeletal:        General: Normal range of motion.     Cervical back: Normal and neck supple.     Thoracic back: Normal.     Lumbar back: Normal. No bony tenderness. Normal range of motion. Negative right straight leg raise test and negative left straight leg raise test.     Right lower leg: No edema.     Left lower leg: No edema.  Lymphadenopathy:     Cervical: No cervical adenopathy.  Skin:    General: Skin is warm and dry.     Findings: No lesion.  Neurological:     General: No focal deficit present.     Mental Status: She is alert. Mental status is at baseline.     Sensory: Sensation is intact.     Motor: Motor function is intact. No weakness.     Coordination: Coordination is intact.     Gait: Gait is intact.     Deep Tendon Reflexes: Reflexes normal.     Reflex Scores:      Tricep reflexes are 0 on the right side and 0 on the left side.      Bicep reflexes are 1+ on the right side and 1+ on the left side.      Brachioradialis reflexes are 2+ on the right side and 2+ on the left side.      Patellar reflexes are 2+ on the right side and 2+ on the left side.      Achilles reflexes are 1+ on the right side and 1+ on the left side. Psychiatric:        Mood and Affect: Mood normal.     Lab Results  Component Value Date   WBC 5.8 01/14/2022   HGB 13.1 01/14/2022   HCT 40.0 01/14/2022   PLT 232.0 01/14/2022   GLUCOSE 127 (H) 07/28/2022   CHOL 207 (H) 01/14/2022   TRIG 69.0 01/14/2022   HDL 86.70 01/14/2022   LDLCALC 106 (H) 01/14/2022   ALT 13 01/14/2022   AST 18 01/14/2022   NA 139 07/28/2022   K 4.0 07/28/2022   CL 104 07/28/2022   CREATININE 0.61 07/28/2022   BUN 20 07/28/2022   CO2 28 07/28/2022   TSH 3.57 07/28/2022   HGBA1C 7.5 (H) 07/28/2022   MICROALBUR 25.1 (H) 07/28/2022    MM Digital Diagnostic Unilat R  Result Date:  06/14/2022 CLINICAL DATA:  Patient returns today to evaluate RIGHT breast  calcifications identified on recent screening mammogram. EXAM: DIGITAL DIAGNOSTIC UNILATERAL RIGHT MAMMOGRAM TECHNIQUE: Right digital diagnostic mammography was performed. COMPARISON:  Previous exams including recent screening mammogram dated 06/01/2022 and earlier screening mammogram dated 02/11/2020. ACR Breast Density Category c: The breast tissue is heterogeneously dense, which may obscure small masses. FINDINGS: On today's additional diagnostic views, including magnification views, the coarse and round calcifications within the lower RIGHT breast are not significantly changed in extent compared to the earlier screening mammogram of 02/11/2020 confirming benignity. No suspicious pleomorphic or fine linear branching calcifications are identified. IMPRESSION: No evidence of malignancy. Benign calcifications within the lower RIGHT breast. Patient may return to routine annual bilateral screening mammogram schedule. RECOMMENDATION: Screening mammogram in one year.(Code:SM-B-01Y) I have discussed the findings and recommendations with the patient. If applicable, a reminder letter will be sent to the patient regarding the next appointment. BI-RADS CATEGORY  2: Benign. Electronically Signed   By: Franki Cabot M.D.   On: 06/14/2022 11:56    DG Lumbar Spine Complete  Result Date: 07/28/2022 CLINICAL DATA:  Low back pain for 6 months. EXAM: LUMBAR SPINE - COMPLETE 4+ VIEW COMPARISON:  None Available. FINDINGS: Grade 1 anterolisthesis L4 on L5. L3-4, L4-5 and L5-S1 degenerative disc disease. Lower lumbar spine facet degenerative changes. No acute osseous abnormality. IMPRESSION: Lower lumbar spine degenerative disc and facet disease. Electronically Signed   By: Lovey Newcomer M.D.   On: 07/28/2022 20:55     Assessment & Plan:   Cambell was seen today for diabetes, hypertension and back pain.  Diagnoses and all orders for this visit:  Type  II diabetes mellitus with manifestations ([place])- Her A1c is up to 7.5% and she has mild renal insufficiency/microalbuminuria.  Will increase the dose of the ARB and will add an SGLT2 inhibitor. -     Hemoglobin A1c; Future -     Basic metabolic panel; Future -     Microalbumin / creatinine urine ratio; Future -     HM Diabetes Foot Exam -     Microalbumin / creatinine urine ratio -     Basic metabolic panel -     Hemoglobin A1c -     dapagliflozin propanediol (FARXIGA) 10 MG TABS tablet; Take 1 tablet (10 mg total) by mouth daily before breakfast.  Hypertension, unspecified type- Her blood pressure is not adequately well-controlled.  Will increase the dose of the ARB. -     TSH; Future -     Basic metabolic panel; Future -     Urinalysis, Routine w reflex microscopic; Future -     Urinalysis, Routine w reflex microscopic -     Basic metabolic panel -     TSH -     losartan (COZAAR) 100 MG tablet; Take 1 tablet (100 mg total) by mouth daily.  Chronic bilateral low back pain with bilateral sciatica- She is neurologically intact.  I recommended that she see orthopedics to consider treatment options. -     DG Lumbar Spine Complete; Future -     Ambulatory referral to Orthopedic Surgery  Mild persistent asthma without complication -     umeclidinium-vilanterol (ANORO ELLIPTA) 62.5-25 MCG/ACT AEPB; Inhale 1 puff into the lungs daily at 6 (six) AM.  Chronic renal disease, stage 2, mildly decreased glomerular filtration rate (GFR) between 60-89 mL/min/1.73 square meter -     dapagliflozin propanediol (FARXIGA) 10 MG TABS tablet; Take 1 tablet (10 mg total) by mouth daily before breakfast.  Microalbuminuria due to type 2  diabetes mellitus (HCC) -     losartan (COZAAR) 100 MG tablet; Take 1 tablet (100 mg total) by mouth daily.  DDD (degenerative disc disease), lumbar -     Ambulatory referral to Orthopedic Surgery   I have discontinued Aaminah C. Morrish "Helen"'s losartan. I am also having her  start on umeclidinium-vilanterol, dapagliflozin propanediol, and losartan. Additionally, I am having her maintain her B-D UF III MINI PEN NEEDLES, pioglitazone, azelastine, ipratropium, OneTouch Verio, OneTouch Delica Lancets 82E, simvastatin, OneTouch Verio, NovoLOG, Arnuity Ellipta, albuterol, metFORMIN, Dexcom G7 Receiver, Media planner, and Dexcom G6 Sensor.  Meds ordered this encounter  Medications   umeclidinium-vilanterol (ANORO ELLIPTA) 62.5-25 MCG/ACT AEPB    Sig: Inhale 1 puff into the lungs daily at 6 (six) AM.    Dispense:  120 each    Refill:  1   dapagliflozin propanediol (FARXIGA) 10 MG TABS tablet    Sig: Take 1 tablet (10 mg total) by mouth daily before breakfast.    Dispense:  90 tablet    Refill:  1   losartan (COZAAR) 100 MG tablet    Sig: Take 1 tablet (100 mg total) by mouth daily.    Dispense:  90 tablet    Refill:  1     Follow-up: Return in about 3 months (around 10/27/2022).  Scarlette Calico, MD

## 2022-07-28 NOTE — Patient Instructions (Signed)

## 2022-07-29 ENCOUNTER — Telehealth: Payer: Self-pay | Admitting: Internal Medicine

## 2022-07-29 DIAGNOSIS — M5136 Other intervertebral disc degeneration, lumbar region: Secondary | ICD-10-CM | POA: Insufficient documentation

## 2022-07-29 DIAGNOSIS — E1129 Type 2 diabetes mellitus with other diabetic kidney complication: Secondary | ICD-10-CM | POA: Insufficient documentation

## 2022-07-29 DIAGNOSIS — R809 Proteinuria, unspecified: Secondary | ICD-10-CM | POA: Insufficient documentation

## 2022-07-29 DIAGNOSIS — N182 Chronic kidney disease, stage 2 (mild): Secondary | ICD-10-CM | POA: Insufficient documentation

## 2022-07-29 LAB — TSH: TSH: 3.57 u[IU]/mL (ref 0.35–5.50)

## 2022-07-29 MED ORDER — LOSARTAN POTASSIUM 100 MG PO TABS: 100.0000 mg | ORAL_TABLET | Freq: Every day | ORAL | 1 refills | Status: DC

## 2022-07-29 MED ORDER — DAPAGLIFLOZIN PROPANEDIOL 10 MG PO TABS: 10.0000 mg | ORAL_TABLET | Freq: Every day | ORAL | 1 refills | Status: DC

## 2022-07-29 NOTE — Telephone Encounter (Signed)
Patient's husband called and said that they do want the referral that you had mentioned after her visit yesterday  Patients:  [id number]

## 2022-08-08 ENCOUNTER — Ambulatory Visit (INDEPENDENT_AMBULATORY_CARE_PROVIDER_SITE_OTHER): Payer: Medicare Other

## 2022-08-08 ENCOUNTER — Ambulatory Visit (INDEPENDENT_AMBULATORY_CARE_PROVIDER_SITE_OTHER): Payer: Medicare Other | Admitting: Sports Medicine

## 2022-08-08 ENCOUNTER — Encounter: Payer: Self-pay | Admitting: Sports Medicine

## 2022-08-08 DIAGNOSIS — G8929 Other chronic pain: Secondary | ICD-10-CM | POA: Diagnosis not present

## 2022-08-08 DIAGNOSIS — M5441 Lumbago with sciatica, right side: Secondary | ICD-10-CM

## 2022-08-08 DIAGNOSIS — M5442 Lumbago with sciatica, left side: Secondary | ICD-10-CM

## 2022-08-08 DIAGNOSIS — M25551 Pain in right hip: Secondary | ICD-10-CM | POA: Insufficient documentation

## 2022-08-08 DIAGNOSIS — M25562 Pain in left knee: Secondary | ICD-10-CM | POA: Diagnosis not present

## 2022-08-08 DIAGNOSIS — M25552 Pain in left hip: Secondary | ICD-10-CM

## 2022-08-08 MED ORDER — MELOXICAM 15 MG PO TABS: 15.0000 mg | ORAL_TABLET | Freq: Every day | ORAL | 1 refills | Status: DC

## 2022-08-08 NOTE — Assessment & Plan Note (Addendum)
Given evidence of isolated hip pain and decreased strength/ROM provocative testing positive on exam, ordering hip XR to r/o OA and will continue with therapy for gluteal strengthening exercises. Meloxicam to decrease inflammation.   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 Continue with current treatment plan.

## 2022-08-08 NOTE — Progress Notes (Signed)
Low back pain for 6 months No injury  Bilateral leg pain/ numbness/tingling  She takes an occasional tylenol for pain Has not tried any PT She does not have any incontinence issues  Had xrays done on 07/28/22

## 2022-08-08 NOTE — Assessment & Plan Note (Signed)
Continue with PT rx, has not trialed conservative treatment regimen for lower back pain with radiculopathy. Meloxicam provided for pain relief to take once daily regularly with food (kidney function appropriate). Consider MRI if pain and radicular symptoms persist past conservative management x6 weeks. No red flag symptoms to causes concern for neural impingement or cauda equina. Return if symptoms persists after conservative treatment, will reassess in 6 weeks time.

## 2022-08-08 NOTE — Progress Notes (Addendum)
SUBJECTIVE:   CHIEF COMPLAINT / HPI:   Lower Back Pain:  Patient is presenting for lower back pain with radiation to the thighs x 6 months. Patient was sent by PCP for the pain to ortho in addition to having L-spine x-rays obtained (see results below). Patient denies saddle paraesthesia, bowel or bladder incontinence. Patient reports that symptoms are worse with sedentary. Improved with activity. Denies history of lumbar surgery or symptoms similar to current in the past. No trauma to the area or systemic symptoms.  It is hard to walk at times. Patient reports that she has pain with walking and notes the radicular symptoms present bilaterally down the lateral aspect of the leg. Pain in the back can wake her up at night, walks for exercise. Tylenol can help somewhat. She has never trialed PT.    Hip Pain  Knee Pain: She notes that she additionally has fairly painful hip pain bilaterally that differs from the radicular pain she has been experiencing. The hip pain feels improved with exercise but also prevents her from laying on the hips at night. Left medial knee pain for a couple of months intermittently and sharp when walking.   PERTINENT  PMH / PSH: DM2, HTN, asthma, diabetic retinopathy, DDD, CKD 2, HLD  OBJECTIVE:   Physical Exam  General: NAD, pleasant, able to participate in exam Respiratory: No respiratory distress Skin: warm and dry, no rashes noted Psych: Normal affect and mood Back: Inspection: Unremarkable  Palpable tenderness: None. Range of Motion:  Flexion 45 deg; Extension 45 deg; Side Bending to 45 deg bilaterally; Rotation to 45 deg bilaterally  Leg strength: Quad: 5/5 Hamstring: 5/5 Hip flexor: 5/5 Hip abductors: 3/5  Strength at foot: Plantar-flexion: 5/5 Dorsi-flexion: 5/5 Eversion: 5/5 Inversion: 5/5  Sensory change: Gross sensation intact to all lumbar and sacral dermatomes.  Reflexes: 2+ at both patellar tendons, 2+ at achilles tendons Gait unremarkable. SLR  laying: Negative  XSLR laying: Negative  FABER: pain bilaterally with FABER FADIR: positive bilaterally.   Left knee: Full flexion and extension with medial tenderness, normal patellar tracking with crepitus on flexion, No swelling or warmth   Imaging: L-spine XR 07/28/22 IMPRESSION: Lower lumbar spine degenerative disc and facet disease.  Bilateral Hip XR in office (AP and frog leg) IMPRESSION  Presence of mild internal space narrowing and OA bilaterally. Signs of impingement with spurring off acetabulum bilaterally, bone spurring off inner trochanter on right    ASSESSMENT/PLAN:  Chronic bilateral low back pain with bilateral sciatica Assessment & Plan: Continue with PT rx, has not trialed conservative treatment regimen for lower back pain with radiculopathy. Meloxicam provided for pain relief to take once daily regularly with food (kidney function appropriate). Consider MRI if pain and radicular symptoms persist past conservative management x6 weeks. No red flag symptoms to causes concern for neural impingement or cauda equina. Return if symptoms persists after conservative treatment, will reassess in 6 weeks time.     Orders: -     Ambulatory referral to Physical Therapy -     XR HIPS BILAT W OR W/O PELVIS 3-4 VIEWS  Bilateral hip pain Assessment & Plan: Given evidence of isolated hip pain and decreased strength/ROM provocative testing positive on exam, ordering hip XR to r/o OA and will continue with therapy for gluteal strengthening exercises. Meloxicam to decrease inflammation.   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 Continue with current treatment plan.  Chronic pain of left knee Assessment & Plan: Possibly related to gait change with other sources of pain. Will continue with treatment of hips and back, and we will reassess. If persists, consider imaging modalities.    Other orders -      Meloxicam; Take 1 tablet (15 mg total) by mouth daily.  Dispense: 30 tablet; Refill: 1    Alfredo Martinez, MD 08/08/2022, 2:31 PM PGY-2, [facility]  Attending Physician - Medical Resident Addendum:   I have independently interviewed and examined the patient. I have discussed the above with the original author and agree with their documentation. My edits for correction/addition/clarification have been made, see any changes above and below.   In summary, 67 year old female with atraumatic 23-month history of bilateral low back pain with reported radiculopathy bilaterally.  No red flag symptoms on exam.  X-ray with grade 1 anterolisthesis as well as DJD and facet arthropathy of the lower lumbar spine.  Her hips are irritable bilaterally, more so with signs of impingement with only mild underlying osteoarthritis.  She does have notable weakness with bilateral hip abduction muscles, we will send her to formalized physical therapy to help improve this and her hip stabilization; also to work on low back exercises as well.  Will start her on meloxicam 15 mg to be taken once daily with food.  Will see how she responds to this at her follow-up in 6 weeks.  If she is still getting radicular symptoms at that time, may proceed with MRI of the lumbar spine.  *Independent review of complete lumbar spine x-ray from 07/20/2022 was reviewed and interpreted by myself.  There is a grade 1 anterolisthesis of L4 on L5.  There is some DJD noted of the mid to lower lumbar spine, there is facet arthropathy at L4-L5 level.  No acute fracture noted.  Hip x-ray 08/08/21: XR HIPS BILAT W OR W/O PELVIS 3-4 VIEWS 2 views of bilateral hips including AP standing lateral view were ordered  and reviewed by myself.  X-rays demonstrate at least mild inferomedial  joint space narrowing bilaterally, right greater than left.  There is some  spurring off the acetabular rim bilaterally, indicative of hip impingement   syndrome.  Superior hip space preserved.  No acute fracture noted.  Madelyn Brunner, DO Primary Care Sports Medicine Physician  [facility]oped[facility]  This note was dictated using Dragon naturally speaking software and may contain errors in syntax, spelling, or content which have not been identified prior to signing this note.

## 2022-08-08 NOTE — Assessment & Plan Note (Signed)
Possibly related to gait change with other sources of pain. Will continue with treatment of hips and back, and we will reassess. If persists, consider imaging modalities.

## 2022-08-12 ENCOUNTER — Other Ambulatory Visit: Payer: BC Managed Care – PPO

## 2022-08-13 ENCOUNTER — Other Ambulatory Visit: Payer: Self-pay | Admitting: Pulmonary Disease

## 2022-08-13 DIAGNOSIS — R0982 Postnasal drip: Secondary | ICD-10-CM

## 2022-08-16 ENCOUNTER — Encounter: Payer: Self-pay | Admitting: Endocrinology

## 2022-08-16 ENCOUNTER — Ambulatory Visit: Payer: Medicare Other | Admitting: Endocrinology

## 2022-08-16 ENCOUNTER — Other Ambulatory Visit: Payer: Self-pay

## 2022-08-16 ENCOUNTER — Encounter: Payer: Self-pay | Admitting: Physical Therapy

## 2022-08-16 ENCOUNTER — Ambulatory Visit (INDEPENDENT_AMBULATORY_CARE_PROVIDER_SITE_OTHER): Payer: Medicare Other | Admitting: Physical Therapy

## 2022-08-16 VITALS — BP 160/80 | HR 76 | Ht 63.0 in | Wt 156.4 lb

## 2022-08-16 DIAGNOSIS — R809 Proteinuria, unspecified: Secondary | ICD-10-CM

## 2022-08-16 DIAGNOSIS — M25552 Pain in left hip: Secondary | ICD-10-CM

## 2022-08-16 DIAGNOSIS — M6281 Muscle weakness (generalized): Secondary | ICD-10-CM | POA: Diagnosis not present

## 2022-08-16 DIAGNOSIS — I10 Essential (primary) hypertension: Secondary | ICD-10-CM | POA: Diagnosis not present

## 2022-08-16 DIAGNOSIS — M25551 Pain in right hip: Secondary | ICD-10-CM

## 2022-08-16 DIAGNOSIS — E1065 Type 1 diabetes mellitus with hyperglycemia: Secondary | ICD-10-CM | POA: Diagnosis not present

## 2022-08-16 DIAGNOSIS — M5459 Other low back pain: Secondary | ICD-10-CM | POA: Diagnosis not present

## 2022-08-16 DIAGNOSIS — E1029 Type 1 diabetes mellitus with other diabetic kidney complication: Secondary | ICD-10-CM

## 2022-08-16 NOTE — Therapy (Signed)
OUTPATIENT PHYSICAL THERAPY THORACOLUMBAR EVALUATION   Patient Name: Catherine Murphy MRN: [id number] DOB:March 05, 1956, 67 y.o., female Today's Date: 08/16/2022  END OF SESSION:  PT End of Session - 08/16/22 1550     Visit Number 1    Number of Visits 15    Date for PT Re-Evaluation 10/11/22    Authorization Type MCR    Progress Note Due on Visit 10    PT Start Time 5784    PT Stop Time 1519    PT Time Calculation (min) 48 min    Activity Tolerance Patient tolerated treatment well    Behavior During Therapy WFL for tasks assessed/performed             Past Medical History:  Diagnosis Date   Diabetes mellitus without complication ([place])    Past Surgical History:  Procedure Laterality Date   CATARACT EXTRACTION Right    COLONOSCOPY     TRIGGER FINGER RELEASE     Patient Active Problem List   Diagnosis Date Noted   Bilateral hip pain 08/08/2022   Chronic pain of left knee 08/08/2022   Chronic renal disease, stage 2, mildly decreased glomerular filtration rate (GFR) between 60-89 mL/min/1.73 square meter 07/29/2022   Microalbuminuria due to type 2 diabetes mellitus ([place]) 07/29/2022   DDD (degenerative disc disease), lumbar 07/29/2022   Chronic bilateral low back pain with bilateral sciatica 07/28/2022   Mild persistent asthma without complication 69/62/9528   Nonproliferative diabetic retinopathy of both eyes ([place]) 11/08/2021   Nuclear sclerotic cataract of left eye 11/08/2021   Pseudophakia, right eye 11/08/2021   Epiretinal membrane 09/28/2021   Hyperlipidemia with target LDL less than 100 03/24/2021   Screening for cervical cancer 03/24/2021   Encounter for general adult medical examination with abnormal findings 03/24/2021   Visit for screening mammogram 03/09/2021   Hypertension 12/17/2020   Type II diabetes mellitus with manifestations ([place]) 12/17/2020    PCP: Janith Lima, MD   REFERRING PROVIDER: Elba Barman, DO  REFERRING DIAG: M54.42,M54.41,G89.29  (ICD-10-CM) - Chronic bilateral low back pain with bilateral sciatica  Rationale for Evaluation and Treatment: Rehabilitation  THERAPY DIAG:  Other low back pain  Pain in right hip  Pain in left hip  Muscle weakness (generalized)  ONSET DATE: around 1 1/2 years ago for low back pain  SUBJECTIVE:                                                                                                                                                                                           SUBJECTIVE STATEMENT: She relays she has low back and bilateral hip pain. She describes  her back pain as deep and when she stands up from a chair she gets numbness down both legs making it difficult to walk. She said her back pain started around a year and a half ago when she moved to the area and became more inactive than she had been previously, but she still does pilates. Her chief complaint is she cannot bend down or squat low without pain and has to use the floor to get back up.   PERTINENT HISTORY:  PERTINENT  PMH / PSH: DM2, HTN, asthma, diabetic retinopathy, DDD, CKD 2, HLD  PAIN:  Are you having pain? Yes: NPRS scale: 7-8/10 Pain location: low back, bilat hips, and numbness down both legs Pain description: deep with numbness when standing up  Aggravating factors: bending/squatting low, sitting for prolonged periods of times, sidelying   Relieving factors: sleeping with a pillow under her back  PRECAUTIONS: None  WEIGHT BEARING RESTRICTIONS: No  FALLS:  Has patient fallen in last 6 months? No   PLOF: Independent  PATIENT GOALS: bend down without pain  NEXT MD VISIT:   OBJECTIVE:   DIAGNOSTIC FINDINGS:  Imaging: L-spine XR 07/28/22 IMPRESSION: Lower lumbar spine degenerative disc and facet disease.   Bilateral Hip XR in office (AP and frog leg) IMPRESSION:  Presence of mild internal space narrowing and OA bilaterally. Signs of impingement with spurring off acetabulum bilaterally,  bone spurring off inner trochanter on right   PATIENT SURVEYS:  FOTO 37% goal 55%  SCREENING FOR RED FLAGS: Bowel or bladder incontinence: No   COGNITION: Overall cognitive status: Within functional limits for tasks assessed     POSTURE: No Significant postural limitations  PALPATION: PAMs L2-L4 painful  TTP paraspinals bilat, glutes bilat, greater trochanter bilat  LUMBAR ROM:   AROM eval  Flexion WFL  Extension WFL  Right lateral flexion WFL  Left lateral flexion WFL  Right rotation WFL - some pain noted  Left rotation WFL - some pain noted   (Blank rows = not tested)    LOWER EXTREMITY ROM:       Right eval Left eval  Hip flexion    Hip extension    Hip abduction     Hip adduction    Hip internal rotation Very limited and painful   Hip external rotation    Knee flexion    Knee extension    Ankle dorsiflexion    Ankle plantarflexion    Ankle inversion    Ankle eversion     (Blank rows = not tested)  LOWER EXTREMITY MMT:    MMT Right eval Left eval  Hip flexion 3+ limited by pain 4+ limited by pain  Hip extension    Hip abduction 4- imited by pain 4- limited by pain  Hip adduction    Hip internal rotation    Hip external rotation    Knee flexion 5 5  Knee extension 4+ 4+  Ankle dorsiflexion    Ankle plantarflexion    Ankle inversion    Ankle eversion     (Blank rows = not tested)  LUMBAR SPECIAL TESTS:   Eval Straight leg raise test: inconclusive, pain noted but cannot say if it was radicular and Slump test: inconclusive, pain noted but cannot say if it was radicular   TODAY'S TREATMENT:  Eval HEP creation and review with demonstration and trial set preformed, see below for details    PATIENT EDUCATION: Education details: HEP, PT plan of care Person educated: Patient Education method: Explanation, Demonstration,  Verbal cues, and Handouts Education comprehension: verbalized understanding and needs further education   HOME  EXERCISE PROGRAM: Access Code: [email or web address] URL: [email or web address] Date: 08/16/2022 Prepared by: Ivery Quale  Exercises - Standing Back Extension at Wall  - 3 x daily - 6 x weekly - 1 sets - 10 reps - 5 sec hold - Supine Bridge  - 2 x daily - 6 x weekly - 1-2 sets - 10 reps - 5 hold - Hip Extension with Resistance Loop  - 2 x daily - 6 x weekly - 1-2 sets - 15 reps - Standing Hip Abduction with Resistance at Ankles and Counter Support  - 2 x daily - 6 x weekly - 1-2 sets - 15 reps - Standing Anti-Rotation Press with Anchored Resistance  - 2 x daily - 6 x weekly - 1-2 sets - 10 reps - Seated Hip External Rotation Stretch  - 2 x daily - 6 x weekly - 1 sets - 3 reps - 30 sec hold  ASSESSMENT:  CLINICAL IMPRESSION: Patient referred to PT for low back and bilateral hip pain. Patient showed good overall mobility in lumbar spine, but was tender to palpation in much of the low back and hip regions. Special testing inconclusive for radicular symptom changes, but with reports of numbness down her legs, extension based exercises were the focus and she handled these well. We will further assess the centralization/peripheralization of this next time. She showed strength deficits in her hips bilaterally, but slightly more weakness and pain in her right hip. She will benefit from skilled PT to address impairments and limitations especially with hip strength and overall pain.   OBJECTIVE IMPAIRMENTS: decreased activity tolerance, difficulty walking, decreased balance, decreased endurance, decreased mobility, decreased ROM, decreased strength, impaired flexibility, impaired LE use, postural dysfunction, and pain.  ACTIVITY LIMITATIONS: bending, lifting, carry, locomotion, cleaning, community activity, driving, and or occupation  PERSONAL FACTORS: PMH / PSH: DM2, HTN, asthma, diabetic retinopathy, DDD, CKD 2, HLD are also affecting patient's functional outcome.  REHAB POTENTIAL: Good  CLINICAL  DECISION MAKING: changing/evolving  EVALUATION COMPLEXITY: Moderate    GOALS: Short term PT Goals Target date: 09/13/2022  Pt will be I and compliant with HEP. Baseline:  Goal status: New Pt will decrease pain by 25% overall Baseline: Goal status: New  Long term PT goals Target date:10/11/2022  Pt will improve  hip/knee strength to at least 5-/5 MMT to improve functional strength Baseline: Goal status: New Pt will improve FOTO to at least 55% functional to show improved function Baseline: Goal status: New Pt will reduce pain to overall less than 2-3/10 with usual activity and work activity. Baseline: Goal status: New     4. Bend/squat pain free to improve overall function with ADLs.      Goal status: New        PLAN: PT FREQUENCY: 1-2 times per week   PT DURATION: 6-8 weeks  PLANNED INTERVENTIONS (unless contraindicated): aquatic PT, Canalith repositioning, cryotherapy, Electrical stimulation, Iontophoresis with 4 mg/ml dexamethasome, Moist heat, traction, Ultrasound, gait training, Therapeutic exercise, balance training, neuromuscular re-education, patient/family education, prosthetic training, manual techniques, passive ROM, dry needling, taping, vasopnuematic device, vestibular, spinal manipulations, joint manipulations  PLAN FOR NEXT SESSION: provide advanced directive pamphlet if she is still interested. Review HEP, check in on extension based exercises and continue if helped, general strength exercises for hips and low back, try some gentle STM     Joanna Borawski, Student-PT 08/16/2022, 3:54 PM

## 2022-08-16 NOTE — Addendum Note (Signed)
Addended by: Debbe Odea on: 08/16/2022 04:38 PM   Modules accepted: Orders

## 2022-08-16 NOTE — Progress Notes (Signed)
Patient ID: Catherine Murphy, female   DOB: 1956-02-16, 67 y.o.   MRN: [id number]           Reason for Appointment: Type I Diabetes follow-up    History of Present Illness   Diagnosis date: 1993  Previous history:  She was started on the tandem insulin pump since 2022 replacing a regimen of Lantus and NovoLog, Lantus was not lasting 24 hours and causing overnight hypoglycemia She has always been on insulin since diagnosis  Recent history:     Basal rate of 0.4 units/hr   Fixed bolus amounts 5 to 7 units Correction factor 1: 50 Sleep mode at 12 AM-8 AM            Side effects from medications: None  Current self management, blood sugar patterns and problems identified:  A1c is 7.5 Basal amount is 33% of her total insulin and recent total insulin about 36 units  Appears to have periodic episodes of severe hyperglycemia especially in the evenings likely related to inadequate or late boluses Also occasionally may have higher readings overnight, not clear if this is related to infusion set issues Although she is trying to bolus right before eating she is mostly taking mealtime coverage of 3 to 5 units This is frequently from not adequate for her mealtime coverage Periodically will take some correction boluses for high readings but this is only 8% of the total insulin usage despite some persistently high readings at times She was seen by the dietitian but she does not want to count carbohydrates She is only has rice or noodles with with her meals  Overall time in range is similar to her last visit  She was recommended Farxiga by her PCP, she is asking about using this  Exercise: Various aerobic exercises Diet management:      CGM interpretation for the last 2 weeks from her Dexcom G6 sensor is as follows  OVERNIGHT blood sugars are modestly elevated with significant variability and generally starting higher of around midnight Low blood sugars are only infrequent and not recent   Overall blood sugars are gradually rising between morning through evening with no consistent pattern POSTPRANDIAL readings tend to be level or lower than Premeal readings at breakfast After lunch and dinner she will frequently have significant hypoglycemia to readings over 250 periodically and generally the highest in the evenings  Also sometimes her postprandial readings may be fairly good for the first 1+ hours and then rising later No hypoglycemia during the day Overall her statistics are very similar to her last visit    CGM use % of time   2-week average/SD 173  Time in range 62  % Time Above 180 38  % Time above 250   % Time Below 70 0      PRE-MEAL  overnight  mornings  afternoon  evening Overall  Glucose range:       Averages: 163 138 189 200 173   Previously  CGM use % of time   2-week average/SD 172+5/-58  Time in range      63%  % Time Above 180 37  % Time above 250   % Time Below 70 0      PRE-MEAL  overnight  mornings  afternoon  evening Overall  Glucose range:       Averages: 156 153 190 191              Dietician visit: Most recent: 6/23   `  Weight control:  Wt Readings from Last 3 Encounters:  08/16/22 156 lb 6.4 oz (70.9 kg)  07/28/22 152 lb (68.9 kg)  04/28/22 154 lb (69.9 kg)            Diabetes labs:  Lab Results  Component Value Date   HGBA1C 7.5 (H) 07/28/2022   HGBA1C 7.3 (A) 01/06/2022   HGBA1C 7.5 (A) 07/15/2021   Lab Results  Component Value Date   MICROALBUR 25.1 (H) 07/28/2022   LDLCALC 106 (H) 01/14/2022   CREATININE 0.61 07/28/2022     Allergies as of 08/16/2022   No Known Allergies      Medication List        Accurate as of August 16, 2022  8:42 PM. If you have any questions, ask your nurse or doctor.          albuterol 108 (90 Base) MCG/ACT inhaler Commonly known as: VENTOLIN HFA Inhale 2 puffs into the lungs every 6 (six) hours as needed for wheezing or shortness of breath.   Arnuity Ellipta 200  MCG/ACT Aepb Generic drug: Fluticasone Furoate Inhale 1 puff into the lungs daily.   azelastine 0.1 % nasal spray Commonly known as: ASTELIN Place 2 sprays into both nostrils as needed for rhinitis. Use in each nostril as directed   B-D UF III MINI PEN NEEDLES 31G X 5 MM Misc Generic drug: Insulin Pen Needle by Does not apply route.   dapagliflozin propanediol 10 MG Tabs tablet Commonly known as: Farxiga Take 1 tablet (10 mg total) by mouth daily before breakfast.   Dexcom G6 Sensor Misc Change every 10 days   Dexcom G6 Transmitter Misc Change every 3 months   Dexcom G7 Receiver Devi 1 Act by Does not apply route daily.   ipratropium 0.03 % nasal spray Commonly known as: ATROVENT PLACE 2 SPRAYS INTO BOTH NOSTRILS EVERY 12 (TWELVE) HOURS.   losartan 100 MG tablet Commonly known as: COZAAR Take 1 tablet (100 mg total) by mouth daily.   meloxicam 15 MG tablet Commonly known as: MOBIC Take 1 tablet (15 mg total) by mouth daily.   metFORMIN 500 MG 24 hr tablet Commonly known as: GLUCOPHAGE-XR TAKE 2 TABLETS BY MOUTH DAILY  WITH BREAKFAST   NovoLOG 100 UNIT/ML injection Generic drug: insulin aspart For use in pump, total of 60 units per day   OneTouch Delica Lancets 33G Misc Use to check blood sugar twice a day. E11.9   OneTouch Verio test strip Generic drug: glucose blood USE 1 STRIP IN THE MORNING, AT NOON, AND AT BEDTIME AS DIRECTED   OneTouch Verio w/Device Kit Inject 1 Act into the skin 2 (two) times daily as needed. Use to monitor blood sugar twice a day. E11.9   pioglitazone 15 MG tablet Commonly known as: ACTOS Take 15 mg by mouth daily.   simvastatin 10 MG tablet Commonly known as: ZOCOR TAKE 1 TABLET BY MOUTH AT  BEDTIME   umeclidinium-vilanterol 62.5-25 MCG/ACT Aepb Commonly known as: ANORO ELLIPTA Inhale 1 puff into the lungs daily at 6 (six) AM.        Allergies: No Known Allergies  Past Medical History:  Diagnosis Date   Diabetes  mellitus without complication (HCC)     Past Surgical History:  Procedure Laterality Date   CATARACT EXTRACTION Right    COLONOSCOPY     TRIGGER FINGER RELEASE      Family History  Problem Relation Age of Onset   Ovarian cancer Mother    Glaucoma Father  Colon cancer Father    Diabetes Neg Hx     Social History:  reports that she has never smoked. She has never used smokeless tobacco. She reports that she does not currently use alcohol. She reports that she does not use drugs.  Review of Systems:  Last diabetic eye exam date is 10/12/2021 with retinopathy  Last foot exam date: 8/22  Urine microalbumin ratio 66 as of 07/29/2022  Hypertension: Treated with losartan and PCP has recommended 100 mg but she is still on 50 mg currently  Bp recently at home about 132/82-86 Appears to be higher today  BP Readings from Last 3 Encounters:  08/16/22 (!) 160/80  07/28/22 136/68  04/28/22 138/88    Lipids: Treated with simvastatin 10 mg daily by her PCP    Lab Results  Component Value Date   CHOL 207 (H) 01/14/2022   CHOL 185 03/24/2021   Lab Results  Component Value Date   HDL 86.70 01/14/2022   HDL 75.90 03/24/2021   Lab Results  Component Value Date   LDLCALC 106 (H) 01/14/2022   LDLCALC 98 03/24/2021   Lab Results  Component Value Date   TRIG 69.0 01/14/2022   TRIG 60.0 03/24/2021   Lab Results  Component Value Date   CHOLHDL 2 01/14/2022   CHOLHDL 2 03/24/2021   No results found for: "LDLDIRECT"   Examination:   BP (!) 160/80 (Patient Position: Standing)   Pulse 76   Ht 5\' 3"  (1.6 m)   Wt 156 lb 6.4 oz (70.9 kg)   SpO2 96%   BMI 27.71 kg/m   Body mass index is 27.71 kg/m.    ASSESSMENT/ PLAN:    Diabetes type 1:   Current regimen: T-slim insulin pump with fixed boluses  See history of present illness for detailed discussion of current diabetes management, blood sugar patterns and problems identified  Her A1c is still relatively high at  7.5  Blood glucose control is not any better compared to the last visit with significant variability Has difficulty with postprandial readings as before  Because of her relatively high carbohydrate diet including rice and noodles she is mostly needing more than 5 units of insulin to cover most of her meals which she does not generally take Also possibly from some higher fat meals she may have a delayed rise in blood sugars at times Currently no symptoms suggestive of gastroparesis  Recommendations:  Keep a food diary for at least a week or so with writing down blood sugars before and after meals and amount of boluses she is taking With this she should be able to keep track of the appropriate dose of insulin for various kinds of meals and use that for future reference Likely she can benefit from reviewing this with the dietitian also but she wants to wait She needs to take correction boluses 1 to 2 hours after meals if blood sugars are rising or not trending downwards when they are high Discussed that she may benefit from the Medtronic 780 pump which has a better algorithm for postprandial control and she will look into this Likely needs at least 2 to 3 units more for any high fat meals which may be taken in split manner  Currently on  metformin 1000 mg daily but this is likely not benefiting Also discussed that [place] is not indicated for type 1 diabetes and may be associated with more ketoacidosis  HYPERTENSION: Fair control Discussed that with her diabetes and microalbuminuria her blood  pressure target should be about 125/75 average  Urine microalbumin higher indicating early nephropathy Recommended that she increase her LOSARTAN to 50 mg twice daily and then go to the 100 mg tablet if tolerated  DIZZINESS symptoms: She will discuss with PCP, does not appear to have any orthostatic drop in blood pressure today  There are no Patient Instructions on file for this visit.  Total visit  time for evaluation and management of multiple problems including counseling = 40 minutes  Elayne Snare 08/16/2022, 8:42 PM

## 2022-08-17 ENCOUNTER — Ambulatory Visit (INDEPENDENT_AMBULATORY_CARE_PROVIDER_SITE_OTHER): Payer: Medicare Other | Admitting: Internal Medicine

## 2022-08-17 ENCOUNTER — Encounter: Payer: Self-pay | Admitting: Internal Medicine

## 2022-08-17 VITALS — BP 164/80 | HR 83 | Temp 98.3°F | Ht 63.0 in | Wt 153.0 lb

## 2022-08-17 DIAGNOSIS — E118 Type 2 diabetes mellitus with unspecified complications: Secondary | ICD-10-CM | POA: Diagnosis not present

## 2022-08-17 DIAGNOSIS — R55 Syncope and collapse: Secondary | ICD-10-CM | POA: Diagnosis not present

## 2022-08-17 DIAGNOSIS — N182 Chronic kidney disease, stage 2 (mild): Secondary | ICD-10-CM

## 2022-08-17 DIAGNOSIS — E1159 Type 2 diabetes mellitus with other circulatory complications: Secondary | ICD-10-CM | POA: Diagnosis not present

## 2022-08-17 DIAGNOSIS — E119 Type 2 diabetes mellitus without complications: Secondary | ICD-10-CM

## 2022-08-17 DIAGNOSIS — Z794 Long term (current) use of insulin: Secondary | ICD-10-CM

## 2022-08-17 DIAGNOSIS — R002 Palpitations: Secondary | ICD-10-CM | POA: Diagnosis not present

## 2022-08-17 DIAGNOSIS — I152 Hypertension secondary to endocrine disorders: Secondary | ICD-10-CM | POA: Insufficient documentation

## 2022-08-17 MED ORDER — INDAPAMIDE 1.25 MG PO TABS: 1.2500 mg | ORAL_TABLET | Freq: Every day | ORAL | 0 refills | Status: DC

## 2022-08-17 NOTE — Patient Instructions (Signed)
Hypertension, Adult High blood pressure (hypertension) is when the force of blood pumping through the arteries is too strong. The arteries are the blood vessels that carry blood from the heart throughout the body. Hypertension forces the heart to work harder to pump blood and may cause arteries to become narrow or stiff. Untreated or uncontrolled hypertension can lead to a heart attack, heart failure, a stroke, kidney disease, and other problems. A blood pressure reading consists of a higher number over a lower number. Ideally, your blood pressure should be below 120/80. The first ("top") number is called the systolic pressure. It is a measure of the pressure in your arteries as your heart beats. The second ("bottom") number is called the diastolic pressure. It is a measure of the pressure in your arteries as the heart relaxes. What are the causes? The exact cause of this condition is not known. There are some conditions that result in high blood pressure. What increases the risk? Certain factors may make you more likely to develop high blood pressure. Some of these risk factors are under your control, including: Smoking. Not getting enough exercise or physical activity. Being overweight. Having too much fat, sugar, calories, or salt (sodium) in your diet. Drinking too much alcohol. Other risk factors include: Having a personal history of heart disease, diabetes, high cholesterol, or kidney disease. Stress. Having a family history of high blood pressure and high cholesterol. Having obstructive sleep apnea. Age. The risk increases with age. What are the signs or symptoms? High blood pressure may not cause symptoms. Very high blood pressure (hypertensive crisis) may cause: Headache. Fast or irregular heartbeats (palpitations). Shortness of breath. Nosebleed. Nausea and vomiting. Vision changes. Severe chest pain, dizziness, and seizures. How is this diagnosed? This condition is diagnosed by  measuring your blood pressure while you are seated, with your arm resting on a flat surface, your legs uncrossed, and your feet flat on the floor. The cuff of the blood pressure monitor will be placed directly against the skin of your upper arm at the level of your heart. Blood pressure should be measured at least twice using the same arm. Certain conditions can cause a difference in blood pressure between your right and left arms. If you have a high blood pressure reading during one visit or you have normal blood pressure with other risk factors, you may be asked to: Return on a different day to have your blood pressure checked again. Monitor your blood pressure at home for 1 week or longer. If you are diagnosed with hypertension, you may have other blood or imaging tests to help your health care provider understand your overall risk for other conditions. How is this treated? This condition is treated by making healthy lifestyle changes, such as eating healthy foods, exercising more, and reducing your alcohol intake. You may be referred for counseling on a healthy diet and physical activity. Your health care provider may prescribe medicine if lifestyle changes are not enough to get your blood pressure under control and if: Your systolic blood pressure is above 130. Your diastolic blood pressure is above 80. Your personal target blood pressure may vary depending on your medical conditions, your age, and other factors. Follow these instructions at home: Eating and drinking  Eat a diet that is high in fiber and potassium, and low in sodium, added sugar, and fat. An example of this eating plan is called the DASH diet. DASH stands for Dietary Approaches to Stop Hypertension. To eat this way: Eat   plenty of fresh fruits and vegetables. Try to fill one half of your plate at each meal with fruits and vegetables. Eat whole grains, such as whole-wheat pasta, brown rice, or whole-grain bread. Fill about one  fourth of your plate with whole grains. Eat or drink low-fat dairy products, such as skim milk or low-fat yogurt. Avoid fatty cuts of meat, processed or cured meats, and poultry with skin. Fill about one fourth of your plate with lean proteins, such as fish, chicken without skin, beans, eggs, or tofu. Avoid pre-made and processed foods. These tend to be higher in sodium, added sugar, and fat. Reduce your daily sodium intake. Many people with hypertension should eat less than 1,500 mg of sodium a day. Do not drink alcohol if: Your health care provider tells you not to drink. You are pregnant, may be pregnant, or are planning to become pregnant. If you drink alcohol: Limit how much you have to: 0-1 drink a day for women. 0-2 drinks a day for men. Know how much alcohol is in your drink. In the [place] one drink equals one 12 oz bottle of beer (355 mL), one 5 oz glass of wine (148 mL), or one 1 oz glass of hard liquor (44 mL). Lifestyle  Work with your health care provider to maintain a healthy body weight or to lose weight. Ask what an ideal weight is for you. Get at least 30 minutes of exercise that causes your heart to beat faster (aerobic exercise) most days of the week. Activities may include walking, swimming, or biking. Include exercise to strengthen your muscles (resistance exercise), such as Pilates or lifting weights, as part of your weekly exercise routine. Try to do these types of exercises for 30 minutes at least 3 days a week. Do not use any products that contain nicotine or tobacco. These products include cigarettes, chewing tobacco, and vaping devices, such as e-cigarettes. If you need help quitting, ask your health care provider. Monitor your blood pressure at home as told by your health care provider. Keep all follow-up visits. This is important. Medicines Take over-the-counter and prescription medicines only as told by your health care provider. Follow directions carefully. Blood  pressure medicines must be taken as prescribed. Do not skip doses of blood pressure medicine. Doing this puts you at risk for problems and can make the medicine less effective. Ask your health care provider about side effects or reactions to medicines that you should watch for. Contact a health care provider if you: Think you are having a reaction to a medicine you are taking. Have headaches that keep coming back (recurring). Feel dizzy. Have swelling in your ankles. Have trouble with your vision. Get help right away if you: Develop a severe headache or confusion. Have unusual weakness or numbness. Feel faint. Have severe pain in your chest or abdomen. Vomit repeatedly. Have trouble breathing. These symptoms may be an emergency. Get help right away. Call 911. Do not wait to see if the symptoms will go away. Do not drive yourself to the hospital. Summary Hypertension is when the force of blood pumping through your arteries is too strong. If this condition is not controlled, it may put you at risk for serious complications. Your personal target blood pressure may vary depending on your medical conditions, your age, and other factors. For most people, a normal blood pressure is less than 120/80. Hypertension is treated with lifestyle changes, medicines, or a combination of both. Lifestyle changes include losing weight, eating a healthy,   low-sodium diet, exercising more, and limiting alcohol. This information is not intended to replace advice given to you by your health care provider. Make sure you discuss any questions you have with your health care provider. Document Revised: 05/25/2021 Document Reviewed: 05/25/2021 [employer] Patient Education  2023 [employer]  

## 2022-08-17 NOTE — Progress Notes (Unsigned)
Subjective:  Patient ID: Catherine Murphy, female    DOB: 06/25/1956  Age: 67 y.o. MRN: [id number]  CC: Hypertension and Diabetes   HPI [name] presents for f/up -  She is up set today and it is hard for me to understand what is making her so upset but it has to do with her diabetes management.  She says there is an issue with the insulin pump, the dosing, and the coverage.  She asked me to order a C-peptide today.  She tells me the stress with this has caused a several week history of anxiety, dizziness, lightheadedness, palpitations, and near syncope.  Outpatient Medications Prior to Visit  Medication Sig Dispense Refill   albuterol (VENTOLIN HFA) 108 (90 Base) MCG/ACT inhaler Inhale 2 puffs into the lungs every 6 (six) hours as needed for wheezing or shortness of breath. 18 g 5   azelastine (ASTELIN) 0.1 % nasal spray Place 2 sprays into both nostrils as needed for rhinitis. Use in each nostril as directed     Blood Glucose Monitoring Suppl (ONETOUCH VERIO) w/Device KIT Inject 1 Act into the skin 2 (two) times daily as needed. Use to monitor blood sugar twice a day. E11.9 1 kit 1   Continuous Blood Gluc Receiver (DEXCOM G7 RECEIVER) DEVI 1 Act by Does not apply route daily. 9 each 1   Continuous Blood Gluc Sensor (DEXCOM G6 SENSOR) MISC Change every 10 days 9 each 2   Continuous Blood Gluc Transmit (DEXCOM G6 TRANSMITTER) MISC Change every 3 months 1 each 2   dapagliflozin propanediol (FARXIGA) 10 MG TABS tablet Take 1 tablet (10 mg total) by mouth daily before breakfast. 90 tablet 1   Fluticasone Furoate (ARNUITY ELLIPTA) 200 MCG/ACT AEPB Inhale 1 puff into the lungs daily. 120 each 1   Insulin Pen Needle (B-D UF III MINI PEN NEEDLES) 31G X 5 MM MISC by Does not apply route.     ipratropium (ATROVENT) 0.03 % nasal spray PLACE 2 SPRAYS INTO BOTH NOSTRILS EVERY 12 (TWELVE) HOURS. 30 mL 4   losartan (COZAAR) 100 MG tablet Take 1 tablet (100 mg total) by mouth daily. 90 tablet 1    meloxicam (MOBIC) 15 MG tablet Take 1 tablet (15 mg total) by mouth daily. 30 tablet 1   metFORMIN (GLUCOPHAGE-XR) 500 MG 24 hr tablet TAKE 2 TABLETS BY MOUTH DAILY  WITH BREAKFAST 180 tablet 0   NOVOLOG 100 UNIT/ML injection For use in pump, total of 60 units per day 60 mL 3   OneTouch Delica Lancets 19J MISC Use to check blood sugar twice a day. E11.9 100 each 0   ONETOUCH VERIO test strip USE 1 STRIP IN THE MORNING, AT NOON, AND AT BEDTIME AS DIRECTED 300 strip 1   pioglitazone (ACTOS) 15 MG tablet Take 15 mg by mouth daily.     simvastatin (ZOCOR) 10 MG tablet TAKE 1 TABLET BY MOUTH AT  BEDTIME 90 tablet 0   umeclidinium-vilanterol (ANORO ELLIPTA) 62.5-25 MCG/ACT AEPB Inhale 1 puff into the lungs daily at 6 (six) AM. 120 each 1   No facility-administered medications prior to visit.    ROS Review of Systems  Constitutional:  Negative for chills, diaphoresis, fatigue and fever.  HENT: Negative.    Eyes: Negative.   Respiratory:  Negative for cough, chest tightness, shortness of breath and wheezing.   Cardiovascular:  Positive for palpitations. Negative for chest pain and leg swelling.  Gastrointestinal:  Negative for abdominal pain, constipation, diarrhea, nausea  and vomiting.  Endocrine: Negative.   Genitourinary:  Negative for difficulty urinating.  Musculoskeletal:  Positive for arthralgias and back pain. Negative for myalgias.  Skin: Negative.   Neurological:  Positive for dizziness and light-headedness. Negative for syncope, weakness and headaches.  Psychiatric/Behavioral:  Positive for dysphoric mood. Negative for agitation, behavioral problems, confusion, decreased concentration, self-injury, sleep disturbance and suicidal ideas. The patient is nervous/anxious.     Objective:  BP (!) 164/80 (BP Location: Right Arm, Patient Position: Sitting, Cuff Size: Large)   Pulse 83   Temp 98.3 F (36.8 C) (Oral)   Ht 5\' 3"  (1.6 m)   Wt 153 lb (69.4 kg)   SpO2 94%   BMI 27.10 kg/m    BP Readings from Last 3 Encounters:  08/17/22 (!) 164/80  08/16/22 (!) 160/80  07/28/22 136/68    Wt Readings from Last 3 Encounters:  08/17/22 153 lb (69.4 kg)  08/16/22 156 lb 6.4 oz (70.9 kg)  07/28/22 152 lb (68.9 kg)    Physical Exam Cardiovascular:     Comments: EKG- NSR, 72 bpm LAE No LVH, Q waves, or ST/T wave changes Psychiatric:        Attention and Perception: She is inattentive.        Mood and Affect: Mood is anxious. Affect is labile and tearful.        Speech: Speech is tangential. Speech is not delayed.        Behavior: Behavior normal. Behavior is cooperative.        Thought Content: Thought content normal.        Cognition and Memory: Cognition normal.     Lab Results  Component Value Date   WBC 5.8 01/14/2022   HGB 13.1 01/14/2022   HCT 40.0 01/14/2022   PLT 232.0 01/14/2022   GLUCOSE 127 (H) 07/28/2022   CHOL 207 (H) 01/14/2022   TRIG 69.0 01/14/2022   HDL 86.70 01/14/2022   LDLCALC 106 (H) 01/14/2022   ALT 13 01/14/2022   AST 18 01/14/2022   NA 139 07/28/2022   K 4.0 07/28/2022   CL 104 07/28/2022   CREATININE 0.61 07/28/2022   BUN 20 07/28/2022   CO2 28 07/28/2022   TSH 3.57 07/28/2022   HGBA1C 7.5 (H) 07/28/2022   MICROALBUR 25.1 (H) 07/28/2022    MM Digital Diagnostic Unilat R  Result Date: 06/14/2022 CLINICAL DATA:  Patient returns today to evaluate RIGHT breast calcifications identified on recent screening mammogram. EXAM: DIGITAL DIAGNOSTIC UNILATERAL RIGHT MAMMOGRAM TECHNIQUE: Right digital diagnostic mammography was performed. COMPARISON:  Previous exams including recent screening mammogram dated 06/01/2022 and earlier screening mammogram dated 02/11/2020. ACR Breast Density Category c: The breast tissue is heterogeneously dense, which may obscure small masses. FINDINGS: On today's additional diagnostic views, including magnification views, the coarse and round calcifications within the lower RIGHT breast are not significantly  changed in extent compared to the earlier screening mammogram of 02/11/2020 confirming benignity. No suspicious pleomorphic or fine linear branching calcifications are identified. IMPRESSION: No evidence of malignancy. Benign calcifications within the lower RIGHT breast. Patient may return to routine annual bilateral screening mammogram schedule. RECOMMENDATION: Screening mammogram in one year.(Code:[id number]) I have discussed the findings and recommendations with the patient. If applicable, a reminder letter will be sent to the patient regarding the next appointment. BI-RADS CATEGORY  2: Benign. Electronically Signed   By: Franki Cabot M.D.   On: 06/14/2022 11:56    Assessment & Plan:   Minsa was seen today for hypertension and  diabetes.  Diagnoses and all orders for this visit:  Rapid palpitations -     CARDIAC EVENT MONITOR; Future -     EKG 12-Lead -     CBC with Differential/Platelet; Future  Near syncope -     CARDIAC EVENT MONITOR; Future  Hypertension associated with diabetes (HCC) -     indapamide (LOZOL) 1.25 MG tablet; Take 1 tablet (1.25 mg total) by mouth daily. -     Basic metabolic panel; Future -     Aldosterone + renin activity w/ ratio; Future -     CBC with Differential/Platelet; Future  Type II diabetes mellitus with manifestations (HCC) -     C-peptide; Future -     Basic metabolic panel; Future   I am having Ramonia C. Hemme "Myriam Jacobson" start on indapamide. I am also having her maintain her B-D UF III MINI PEN NEEDLES, pioglitazone, azelastine, OneTouch Verio, OneTouch Delica Lancets 33G, simvastatin, OneTouch Verio, NovoLOG, Arnuity Ellipta, albuterol, metFORMIN, Dexcom G7 Receiver, Dexcom G6 Transmitter, Dexcom G6 Sensor, umeclidinium-vilanterol, dapagliflozin propanediol, losartan, meloxicam, and ipratropium.  Meds ordered this encounter  Medications   indapamide (LOZOL) 1.25 MG tablet    Sig: Take 1 tablet (1.25 mg total) by mouth daily.    Dispense:  90 tablet     Refill:  0     Follow-up: Return in about 4 weeks (around 09/14/2022).  Sanda Linger, MD

## 2022-08-18 ENCOUNTER — Telehealth: Payer: Self-pay | Admitting: Internal Medicine

## 2022-08-18 ENCOUNTER — Other Ambulatory Visit: Payer: Self-pay | Admitting: Pulmonary Disease

## 2022-08-18 ENCOUNTER — Telehealth: Payer: Self-pay

## 2022-08-18 DIAGNOSIS — R0982 Postnasal drip: Secondary | ICD-10-CM

## 2022-08-18 LAB — CBC WITH DIFFERENTIAL/PLATELET
Basophils Absolute: 0 10*3/uL (ref 0.0–0.1)
Basophils Relative: 0.9 % (ref 0.0–3.0)
Eosinophils Absolute: 0.2 10*3/uL (ref 0.0–0.7)
Eosinophils Relative: 4.2 % (ref 0.0–5.0)
HCT: 38.2 % (ref 36.0–46.0)
Hemoglobin: 13 g/dL (ref 12.0–15.0)
Lymphocytes Relative: 37.4 % (ref 12.0–46.0)
Lymphs Abs: 1.8 10*3/uL (ref 0.7–4.0)
MCHC: 34.1 g/dL (ref 30.0–36.0)
MCV: 90.2 fl (ref 78.0–100.0)
Monocytes Absolute: 0.3 10*3/uL (ref 0.1–1.0)
Monocytes Relative: 7 % (ref 3.0–12.0)
Neutro Abs: 2.5 10*3/uL (ref 1.4–7.7)
Neutrophils Relative %: 50.5 % (ref 43.0–77.0)
Platelets: 249 10*3/uL (ref 150.0–400.0)
RBC: 4.24 Mil/uL (ref 3.87–5.11)
RDW: 12.8 % (ref 11.5–15.5)
WBC: 4.8 10*3/uL (ref 4.0–10.5)

## 2022-08-18 LAB — BASIC METABOLIC PANEL
BUN: 17 mg/dL (ref 6–23)
CO2: 25 mEq/L (ref 19–32)
Calcium: 8.6 mg/dL (ref 8.4–10.5)
Chloride: 104 mEq/L (ref 96–112)
Creatinine, Ser: 0.56 mg/dL (ref 0.40–1.20)
GFR: 95.98 mL/min (ref >60.00)
Glucose, Bld: 172 mg/dL — ABNORMAL HIGH (ref 70–99)
Potassium: 4 mEq/L (ref 3.5–5.1)
Sodium: 139 mEq/L (ref 135–145)

## 2022-08-18 NOTE — Telephone Encounter (Signed)
[facility] and patient have been calling office requesting additional supply letter, labs, and most recent chart notes. I have faxed additional supply letter several times. Oletta Lamas states that they can not use high insulin usage for medical reason change for infusion sets. We have to use reasons such as site irritation, scar tissue, poor absorption, or high glucose levels on third day. Please advise Also they are needing C-peptide and glucose to be drawn on same day. Looks like the c-peptide was drawn today by another office. Informed patient that she would need to have them fax to [facility]. Patient and Husband also states that they are not happy that when they received their AVS after visit on 08/16/22 patient has a diagnosis as Type 1 Diabetic. Patient states she has been a diabetic for over 30 years and has always been a Type 2 Diabetic. Feels a conversation should have been had with them when they were at visit. Wanted [place] to know they did he did file a grievance on yesterday. He would like conversation with Dr.

## 2022-08-18 NOTE — Telephone Encounter (Signed)
Patient's husband called and asked that a copy of her blood work be sent to [facility]  Fax:  [phone]

## 2022-08-19 ENCOUNTER — Encounter: Payer: Self-pay | Admitting: Endocrinology

## 2022-08-19 NOTE — Telephone Encounter (Signed)
All lab work has not been resulted as of today. Will check back on Monday.

## 2022-08-20 ENCOUNTER — Other Ambulatory Visit: Payer: Self-pay | Admitting: Internal Medicine

## 2022-08-20 DIAGNOSIS — E118 Type 2 diabetes mellitus with unspecified complications: Secondary | ICD-10-CM

## 2022-08-20 DIAGNOSIS — E119 Type 2 diabetes mellitus without complications: Secondary | ICD-10-CM

## 2022-08-22 ENCOUNTER — Telehealth: Payer: Self-pay | Admitting: Internal Medicine

## 2022-08-22 NOTE — Telephone Encounter (Signed)
Pt called to ask about status of labs, told pt that PCP has not reviewed them yet.  Pt requested that after labs have been reviewed that we fax the lab results and the most recent OV notes to her INS.  Please fax PW to:   [phone]

## 2022-08-23 ENCOUNTER — Encounter: Payer: Self-pay | Admitting: Physical Therapy

## 2022-08-23 ENCOUNTER — Ambulatory Visit (INDEPENDENT_AMBULATORY_CARE_PROVIDER_SITE_OTHER): Payer: Medicare Other | Admitting: Physical Therapy

## 2022-08-23 DIAGNOSIS — M25551 Pain in right hip: Secondary | ICD-10-CM

## 2022-08-23 DIAGNOSIS — M5459 Other low back pain: Secondary | ICD-10-CM

## 2022-08-23 DIAGNOSIS — M6281 Muscle weakness (generalized): Secondary | ICD-10-CM | POA: Diagnosis not present

## 2022-08-23 DIAGNOSIS — M25552 Pain in left hip: Secondary | ICD-10-CM | POA: Diagnosis not present

## 2022-08-23 NOTE — Therapy (Signed)
OUTPATIENT PHYSICAL THERAPY THORACOLUMBAR TREATMENT   Patient Name: Catherine Murphy MRN: [id number] DOB:03/26/1956, 67 y.o., female Today's Date: 08/23/2022  END OF SESSION:  PT End of Session - 08/23/22 1520     Visit Number 2    Number of Visits 15    Date for PT Re-Evaluation 10/11/22    Authorization Type MCR    Progress Note Due on Visit 10    PT Start Time 5621    PT Stop Time 1600    PT Time Calculation (min) 43 min    Activity Tolerance Patient tolerated treatment well    Behavior During Therapy WFL for tasks assessed/performed             Past Medical History:  Diagnosis Date   Diabetes mellitus without complication ([place])    Past Surgical History:  Procedure Laterality Date   CATARACT EXTRACTION Right    COLONOSCOPY     TRIGGER FINGER RELEASE     Patient Active Problem List   Diagnosis Date Noted   Rapid palpitations 08/17/2022   Near syncope 08/17/2022   Hypertension associated with diabetes ([place]) 08/17/2022   Bilateral hip pain 08/08/2022   Chronic pain of left knee 08/08/2022   Chronic renal disease, stage 2, mildly decreased glomerular filtration rate (GFR) between 60-89 mL/min/1.73 square meter 07/29/2022   Microalbuminuria due to type 2 diabetes mellitus ([place]) 07/29/2022   DDD (degenerative disc disease), lumbar 07/29/2022   Chronic bilateral low back pain with bilateral sciatica 07/28/2022   Mild persistent asthma without complication 30/86/5784   Nonproliferative diabetic retinopathy of both eyes ([place]) 11/08/2021   Nuclear sclerotic cataract of left eye 11/08/2021   Pseudophakia, right eye 11/08/2021   Epiretinal membrane 09/28/2021   Hyperlipidemia with target LDL less than 100 03/24/2021   Screening for cervical cancer 03/24/2021   Encounter for general adult medical examination with abnormal findings 03/24/2021   Visit for screening mammogram 03/09/2021   Type II diabetes mellitus with manifestations ([place]) 12/17/2020    PCP: Janith Lima, MD   REFERRING PROVIDER: Elba Barman, DO  REFERRING DIAG: M54.42,M54.41,G89.29 (ICD-10-CM) - Chronic bilateral low back pain with bilateral sciatica  Rationale for Evaluation and Treatment: Rehabilitation  THERAPY DIAG:  Other low back pain  Pain in right hip  Pain in left hip  Muscle weakness (generalized)  ONSET DATE: around 1 1/2 years ago for low back pain  SUBJECTIVE:  SUBJECTIVE STATEMENT: She relays that she is not having as much pain especially her hips are feeling better.   PERTINENT HISTORY:  PERTINENT  PMH / PSH: DM2, HTN, asthma, diabetic retinopathy, DDD, CKD 2, HLD  PAIN:  Are you having pain? Yes: NPRS scale: 6/10 Pain location: low back, bilat hips, and numbness down both legs Pain description: deep with numbness when standing up  Aggravating factors: bending/squatting low, sitting for prolonged periods of times, sidelying   Relieving factors: sleeping with a pillow under her back  PRECAUTIONS: None  WEIGHT BEARING RESTRICTIONS: No  FALLS:  Has patient fallen in last 6 months? No   PLOF: Independent  PATIENT GOALS: bend down without pain  NEXT MD VISIT:   OBJECTIVE:   DIAGNOSTIC FINDINGS:  Imaging: L-spine XR 07/28/22 IMPRESSION: Lower lumbar spine degenerative disc and facet disease.   Bilateral Hip XR in office (AP and frog leg) IMPRESSION:  Presence of mild internal space narrowing and OA bilaterally. Signs of impingement with spurring off acetabulum bilaterally, bone spurring off inner trochanter on right   PATIENT SURVEYS:  FOTO 37% goal 55%  SCREENING FOR RED FLAGS: Bowel or bladder incontinence: No   COGNITION: Overall cognitive status: Within functional limits for tasks assessed     POSTURE: No Significant postural  limitations  PALPATION: PAMs L2-L4 painful  TTP paraspinals bilat, glutes bilat, greater trochanter bilat  LUMBAR ROM:   AROM eval  Flexion WFL  Extension WFL  Right lateral flexion WFL  Left lateral flexion WFL  Right rotation WFL - some pain noted  Left rotation WFL - some pain noted   (Blank rows = not tested)    LOWER EXTREMITY ROM:       Right eval Left eval  Hip flexion    Hip extension    Hip abduction     Hip adduction    Hip internal rotation Very limited and painful   Hip external rotation    Knee flexion    Knee extension    Ankle dorsiflexion    Ankle plantarflexion    Ankle inversion    Ankle eversion     (Blank rows = not tested)  LOWER EXTREMITY MMT:    MMT Right eval Left eval  Hip flexion 3+ limited by pain 4+ limited by pain  Hip extension    Hip abduction 4- imited by pain 4- limited by pain  Hip adduction    Hip internal rotation    Hip external rotation    Knee flexion 5 5  Knee extension 4+ 4+  Ankle dorsiflexion    Ankle plantarflexion    Ankle inversion    Ankle eversion     (Blank rows = not tested)  LUMBAR SPECIAL TESTS:   Eval Straight leg raise test: inconclusive, pain noted but cannot say if it was radicular and Slump test: inconclusive, pain noted but cannot say if it was radicular   TODAY'S TREATMENT:  08/23/22 -Nu step seat 5 level 6 x85min  -Standing back extensions hold 5 sec x10 -Standing hip abduction with green band around ankles x15 each leg  -Standing hip extension with green band around ankles x15 each leg  -standing anti rotation with red band in door x10 each direction -standing hip flexion with green band under opposite leg x10 each leg  -leg press machine DL #[id number], single leg #41 2x15 each leg  -sit to stands no UE support down slow x10 -seated hip external rotation stretch holding hold for 30 sec  x2 each side -supine hip flexion stretch hold for 30 sec x2 each side   Eval HEP creation and  review with demonstration and trial set preformed, see below for details    PATIENT EDUCATION: Education details: HEP, PT plan of care Person educated: Patient Education method: Explanation, Demonstration, Verbal cues, and Handouts Education comprehension: verbalized understanding and needs further education   HOME EXERCISE PROGRAM: Access Code: [email or web address] URL: [email or web address] Date: 08/16/2022 Prepared by: Elsie Ra  Exercises - Standing Back Extension at [place]  - 3 x daily - 6 x weekly - 1 sets - 10 reps - 5 sec hold - Supine Bridge  - 2 x daily - 6 x weekly - 1-2 sets - 10 reps - 5 hold - Hip Extension with Resistance Loop  - 2 x daily - 6 x weekly - 1-2 sets - 15 reps - Standing Hip Abduction with Resistance at Ankles and Counter Support  - 2 x daily - 6 x weekly - 1-2 sets - 15 reps - Standing Anti-Rotation Press with Anchored Resistance  - 2 x daily - 6 x weekly - 1-2 sets - 10 reps - Seated Hip External Rotation Stretch  - 2 x daily - 6 x weekly - 1 sets - 3 reps - 30 sec hold  ASSESSMENT:  CLINICAL IMPRESSION: Patient showed good adherence to HEP with review of her exercises. She showed good tolerance to added strength exercises today, but still reports feelings of weakness in her hips and back. She will continue to benefit from skilled PT to address these limitations.   OBJECTIVE IMPAIRMENTS: decreased activity tolerance, difficulty walking, decreased balance, decreased endurance, decreased mobility, decreased ROM, decreased strength, impaired flexibility, impaired LE use, postural dysfunction, and pain.  ACTIVITY LIMITATIONS: bending, lifting, carry, locomotion, cleaning, community activity, driving, and or occupation  PERSONAL FACTORS: PMH / [place]: DM2, HTN, asthma, diabetic retinopathy, DDD, CKD 2, HLD are also affecting patient's functional outcome.  REHAB POTENTIAL: Good  CLINICAL DECISION MAKING: changing/evolving  EVALUATION COMPLEXITY:  Moderate    GOALS: Short term PT Goals Target date: 09/13/2022  Pt will be I and compliant with HEP. Baseline:  Goal status: New Pt will decrease pain by 25% overall Baseline: Goal status: New  Long term PT goals Target date:10/11/2022  Pt will improve  hip/knee strength to at least 5-/5 MMT to improve functional strength Baseline: Goal status: New Pt will improve FOTO to at least 55% functional to show improved function Baseline: Goal status: New Pt will reduce pain to overall less than 2-3/10 with usual activity and work activity. Baseline: Goal status: New     4. Bend/squat pain free to improve overall function with ADLs.      Goal status: New        PLAN: PT FREQUENCY: 1-2 times per week   PT DURATION: 6-8 weeks  PLANNED INTERVENTIONS (unless contraindicated): aquatic PT, Canalith repositioning, cryotherapy, Electrical stimulation, Iontophoresis with 4 mg/ml dexamethasome, Moist heat, traction, Ultrasound, gait training, Therapeutic exercise, balance training, neuromuscular re-education, patient/family education, prosthetic training, manual techniques, passive ROM, dry needling, taping, vasopnuematic device, vestibular, spinal manipulations, joint manipulations  PLAN FOR NEXT SESSION: general strength exercises for hips and low back, try some gentle STM     Jachelle Fluty, Student-PT 08/23/2022, 3:21 PM

## 2022-08-23 NOTE — Telephone Encounter (Signed)
Patient says one cartridge and infusion set is not lasting 3 days.  She was told to fill the cartridge to 200u and that this will last her 3.5 days.  And it will be ok to wait until the cartridge is empty before filling this again.  She says some days she needs more insulin.   I have phone [facility], because they continue to tell the patient they are requesting chart notes and labs and that we have not sent those in.  I spoke with Ricky Barr.  Above requested notes and labs were faxed directly to him at [phone].   

## 2022-08-24 ENCOUNTER — Ambulatory Visit: Payer: Medicare Other | Attending: Internal Medicine

## 2022-08-24 DIAGNOSIS — R002 Palpitations: Secondary | ICD-10-CM | POA: Diagnosis not present

## 2022-08-24 DIAGNOSIS — R55 Syncope and collapse: Secondary | ICD-10-CM | POA: Diagnosis not present

## 2022-08-24 NOTE — Telephone Encounter (Signed)
Labs have been printed and faxed to number provided.

## 2022-08-24 NOTE — Telephone Encounter (Signed)
Patient says one cartridge and infusion set is not lasting 3 days.  She was told to fill the cartridge to 200u and that this will last her 3.5 days.  And it will be ok to wait until the cartridge is empty before filling this again.  She says some days she needs more insulin.   I have phone [facility], because they continue to tell the patient they are requesting chart notes and labs and that we have not sent those in.  I spoke with Roderic Palau.  Above requested notes and labs were faxed directly to him at [phone].

## 2022-08-24 NOTE — Telephone Encounter (Signed)
PT calls back again today following up on the labs. The only labs not released are C-peptide and Aldosterone.   PT stated she only really needed the labs involving the glucose/A1C to be sent to [facility] so that she could get her insulin pump.  CB for PT: [phone]  FAX: [phone]

## 2022-08-25 ENCOUNTER — Encounter: Payer: Self-pay | Admitting: Physical Therapy

## 2022-08-25 ENCOUNTER — Ambulatory Visit (INDEPENDENT_AMBULATORY_CARE_PROVIDER_SITE_OTHER): Payer: Medicare Other | Admitting: Physical Therapy

## 2022-08-25 ENCOUNTER — Encounter: Payer: Self-pay | Admitting: Internal Medicine

## 2022-08-25 DIAGNOSIS — M25552 Pain in left hip: Secondary | ICD-10-CM | POA: Diagnosis not present

## 2022-08-25 DIAGNOSIS — M5459 Other low back pain: Secondary | ICD-10-CM

## 2022-08-25 DIAGNOSIS — E119 Type 2 diabetes mellitus without complications: Secondary | ICD-10-CM | POA: Insufficient documentation

## 2022-08-25 DIAGNOSIS — M6281 Muscle weakness (generalized): Secondary | ICD-10-CM | POA: Diagnosis not present

## 2022-08-25 DIAGNOSIS — M25551 Pain in right hip: Secondary | ICD-10-CM | POA: Diagnosis not present

## 2022-08-25 LAB — ALDOSTERONE + RENIN ACTIVITY W/ RATIO
ALDO / PRA Ratio: 2.5 Ratio (ref 0.9–28.9)
Aldosterone: 2 ng/dL
Renin Activity: 0.79 ng/mL/h (ref 0.25–5.82)

## 2022-08-25 LAB — C-PEPTIDE: C-Peptide: 0.1 ng/mL — ABNORMAL LOW (ref 0.80–3.85)

## 2022-08-25 NOTE — Therapy (Signed)
OUTPATIENT PHYSICAL THERAPY THORACOLUMBAR TREATMENT   Patient Name: Chevelle Coulson MRN: [id number] DOB:1956-02-01, 67 y.o., female Today's Date: 08/25/2022  END OF SESSION:  PT End of Session - 08/25/22 1259     Visit Number 3    Number of Visits 15    Date for PT Re-Evaluation 10/11/22    Authorization Type MCR    Progress Note Due on Visit 10    PT Start Time 1300    PT Stop Time 1339    PT Time Calculation (min) 39 min    Activity Tolerance Patient tolerated treatment well    Behavior During Therapy WFL for tasks assessed/performed              Past Medical History:  Diagnosis Date   Diabetes mellitus without complication (HCC)    Past Surgical History:  Procedure Laterality Date   CATARACT EXTRACTION Right    COLONOSCOPY     TRIGGER FINGER RELEASE     Patient Active Problem List   Diagnosis Date Noted   Rapid palpitations 08/17/2022   Near syncope 08/17/2022   Hypertension associated with diabetes (HCC) 08/17/2022   Bilateral hip pain 08/08/2022   Chronic pain of left knee 08/08/2022   Chronic renal disease, stage 2, mildly decreased glomerular filtration rate (GFR) between 60-89 mL/min/1.73 square meter 07/29/2022   Microalbuminuria due to type 2 diabetes mellitus (HCC) 07/29/2022   DDD (degenerative disc disease), lumbar 07/29/2022   Chronic bilateral low back pain with bilateral sciatica 07/28/2022   Mild persistent asthma without complication 04/28/2022   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) 11/08/2021   Nuclear sclerotic cataract of left eye 11/08/2021   Pseudophakia, right eye 11/08/2021   Epiretinal membrane 09/28/2021   Hyperlipidemia with target LDL less than 100 03/24/2021   Screening for cervical cancer 03/24/2021   Encounter for general adult medical examination with abnormal findings 03/24/2021   Visit for screening mammogram 03/09/2021   Type II diabetes mellitus with manifestations (HCC) 12/17/2020    PCP: Etta Grandchild, MD   REFERRING PROVIDER: Madelyn Brunner, DO  REFERRING DIAG: M54.42,M54.41,G89.29 (ICD-10-CM) - Chronic bilateral low back pain with bilateral sciatica  Rationale for Evaluation and Treatment: Rehabilitation  THERAPY DIAG:  Other low back pain  Pain in right hip  Pain in left hip  Muscle weakness (generalized)  ONSET DATE: around 1 1/2 years ago for low back pain  SUBJECTIVE:  SUBJECTIVE STATEMENT: Still having some pain in hips and back, not as much though She relays that she is not having as much pain especially her hips are feeling better.   PERTINENT HISTORY:  PERTINENT  PMH / PSH: DM2, HTN, asthma, diabetic retinopathy, DDD, CKD 2, HLD  PAIN:  Are you having pain? Yes: NPRS scale: 5/10 Pain location: low back, bilat hips, and numbness down both legs Pain description: deep with numbness when standing up  Aggravating factors: bending/squatting low, sitting for prolonged periods of times, sidelying   Relieving factors: sleeping with a pillow under her back  PRECAUTIONS: None  WEIGHT BEARING RESTRICTIONS: No  FALLS:  Has patient fallen in last 6 months? No   PLOF: Independent  PATIENT GOALS: bend down without pain  NEXT MD VISIT: 09/19/22  OBJECTIVE:   DIAGNOSTIC FINDINGS:  Imaging: L-spine XR 07/28/22 IMPRESSION: Lower lumbar spine degenerative disc and facet disease.   Bilateral Hip XR in office (AP and frog leg) IMPRESSION:  Presence of mild internal space narrowing and OA bilaterally. Signs of impingement with spurring off acetabulum bilaterally, bone spurring off inner trochanter on right   PATIENT SURVEYS:  FOTO 37% goal 55%  SCREENING FOR RED FLAGS: Bowel or bladder incontinence: No  PALPATION: PAMs L2-L4 painful  TTP paraspinals bilat, glutes bilat,  greater trochanter bilat  LUMBAR ROM:   AROM eval  Flexion WFL  Extension WFL  Right lateral flexion WFL  Left lateral flexion WFL  Right rotation WFL - some pain noted  Left rotation WFL - some pain noted   (Blank rows = not tested)    LOWER EXTREMITY ROM:       Right eval Right 08/25/22  Hip flexion    Hip extension    Hip abduction     Hip adduction    Hip internal rotation Very limited and painful 35 deg (min pain)  Hip external rotation    Knee flexion    Knee extension    Ankle dorsiflexion    Ankle plantarflexion    Ankle inversion    Ankle eversion     (Blank rows = not tested)  LOWER EXTREMITY MMT:    MMT Right eval Left eval  Hip flexion 3+ limited by pain 4+ limited by pain  Hip extension    Hip abduction 4- imited by pain 4- limited by pain  Hip adduction    Hip internal rotation    Hip external rotation    Knee flexion 5 5  Knee extension 4+ 4+   (Blank rows = not tested)  LUMBAR SPECIAL TESTS:   Eval Straight leg raise test: inconclusive, pain noted but cannot say if it was radicular and Slump test: inconclusive, pain noted but cannot say if it was radicular   TODAY'S TREATMENT:  08/25/22 TherEx NuStep L6 x 5 min Standing lumbar extension 10 x 5 sec hold Seated piriformis stretch (figure-4) 3x30 sec bil [place] with strap for isometric hip abdct/er 2x10 reps; 5 sec hold Single limb clamshell with L4 band 2x10 bil in hooklying Seated bil hip internal rotation with L4 band 2x10 Leg Press 75# 2x10 (cues to decrease speed); then single limb 37# 2x10 bil   08/23/22 -Nu step seat 5 level 6 x67min  -Standing back extensions hold 5 sec x10 -Standing hip abduction with green band around ankles x15 each leg  -Standing hip extension with green band around ankles x15 each leg  -standing anti rotation with red band in door x10 each direction -standing hip  flexion with green band under opposite leg x10 each leg  -leg press machine DL #75 2x15,  single leg #37 2x15 each leg  -sit to stands no UE support down slow x10 -seated hip external rotation stretch holding hold for 30 sec x2 each side -supine hip flexion stretch hold for 30 sec x2 each side   Eval HEP creation and review with demonstration and trial set preformed, see below for details    PATIENT EDUCATION: Education details: HEP, PT plan of care Person educated: Patient Education method: Explanation, Demonstration, Verbal cues, and Handouts Education comprehension: verbalized understanding and needs further education   HOME EXERCISE PROGRAM: Access Code: [email or web address] URL: [email or web address] Date: 08/16/2022 Prepared by: Elsie Ra  Exercises - Standing Back Extension at [place]  - 3 x daily - 6 x weekly - 1 sets - 10 reps - 5 sec hold - Supine Bridge  - 2 x daily - 6 x weekly - 1-2 sets - 10 reps - 5 hold - Hip Extension with Resistance Loop  - 2 x daily - 6 x weekly - 1-2 sets - 15 reps - Standing Hip Abduction with Resistance at Ankles and Counter Support  - 2 x daily - 6 x weekly - 1-2 sets - 15 reps - Standing Anti-Rotation Press with Anchored Resistance  - 2 x daily - 6 x weekly - 1-2 sets - 10 reps - Seated Hip External Rotation Stretch  - 2 x daily - 6 x weekly - 1 sets - 3 reps - 30 sec hold  ASSESSMENT:  CLINICAL IMPRESSION:  Pt tolerated session well today with improvement in internal rotation noted.  Will continue to benefit from PT to maximize function.  OBJECTIVE IMPAIRMENTS: decreased activity tolerance, difficulty walking, decreased balance, decreased endurance, decreased mobility, decreased ROM, decreased strength, impaired flexibility, impaired LE use, postural dysfunction, and pain.  ACTIVITY LIMITATIONS: bending, lifting, carry, locomotion, cleaning, community activity, driving, and or occupation  PERSONAL FACTORS: PMH / [place]: DM2, HTN, asthma, diabetic retinopathy, DDD, CKD 2, HLD are also affecting patient's functional  outcome.  REHAB POTENTIAL: Good  CLINICAL DECISION MAKING: changing/evolving  EVALUATION COMPLEXITY: Moderate    GOALS: Short term PT Goals Target date: 09/13/2022  Pt will be I and compliant with HEP. Baseline:  Goal status: New Pt will decrease pain by 25% overall Baseline: Goal status: New  Long term PT goals Target date:10/11/2022  Pt will improve  hip/knee strength to at least 5-/5 MMT to improve functional strength Baseline: Goal status: New Pt will improve FOTO to at least 55% functional to show improved function Baseline: Goal status: New Pt will reduce pain to overall less than 2-3/10 with usual activity and work activity. Baseline: Goal status: New     4. Bend/squat pain free to improve overall function with ADLs.      Goal status: New        PLAN: PT FREQUENCY: 1-2 times per week   PT DURATION: 6-8 weeks  PLANNED INTERVENTIONS (unless contraindicated): aquatic PT, Canalith repositioning, cryotherapy, Electrical stimulation, Iontophoresis with 4 mg/ml dexamethasome, Moist heat, traction, Ultrasound, gait training, Therapeutic exercise, balance training, neuromuscular re-education, patient/family education, prosthetic training, manual techniques, passive ROM, dry needling, taping, vasopnuematic device, vestibular, spinal manipulations, joint manipulations  PLAN FOR NEXT SESSION: general strength exercises for hips and low back, try some gentle STM     Laureen Abrahams, PT, DPT 08/25/22 1:40 PM

## 2022-08-29 ENCOUNTER — Ambulatory Visit (INDEPENDENT_AMBULATORY_CARE_PROVIDER_SITE_OTHER): Payer: Medicare Other | Admitting: Internal Medicine

## 2022-08-29 ENCOUNTER — Telehealth: Payer: Self-pay | Admitting: Internal Medicine

## 2022-08-29 ENCOUNTER — Encounter: Payer: Self-pay | Admitting: Internal Medicine

## 2022-08-29 ENCOUNTER — Ambulatory Visit (INDEPENDENT_AMBULATORY_CARE_PROVIDER_SITE_OTHER): Payer: Medicare Other | Admitting: Physical Therapy

## 2022-08-29 ENCOUNTER — Encounter: Payer: Self-pay | Admitting: Physical Therapy

## 2022-08-29 VITALS — BP 138/74 | HR 92 | Temp 99.0°F | Resp 16 | Ht 63.0 in | Wt 149.4 lb

## 2022-08-29 DIAGNOSIS — M25551 Pain in right hip: Secondary | ICD-10-CM

## 2022-08-29 DIAGNOSIS — I152 Hypertension secondary to endocrine disorders: Secondary | ICD-10-CM | POA: Diagnosis not present

## 2022-08-29 DIAGNOSIS — R002 Palpitations: Secondary | ICD-10-CM | POA: Diagnosis not present

## 2022-08-29 DIAGNOSIS — E1159 Type 2 diabetes mellitus with other circulatory complications: Secondary | ICD-10-CM

## 2022-08-29 DIAGNOSIS — M6281 Muscle weakness (generalized): Secondary | ICD-10-CM

## 2022-08-29 DIAGNOSIS — M25552 Pain in left hip: Secondary | ICD-10-CM

## 2022-08-29 DIAGNOSIS — M5459 Other low back pain: Secondary | ICD-10-CM | POA: Diagnosis not present

## 2022-08-29 DIAGNOSIS — L231 Allergic contact dermatitis due to adhesives: Secondary | ICD-10-CM | POA: Diagnosis not present

## 2022-08-29 MED ORDER — HYDROXYZINE HCL 10 MG PO TABS: 10.0000 mg | ORAL_TABLET | Freq: Three times a day (TID) | ORAL | 0 refills | Status: AC | PRN

## 2022-08-29 MED ORDER — FLUOCINONIDE EMULSIFIED BASE 0.05 % EX CREA: 1.0000 | TOPICAL_CREAM | Freq: Two times a day (BID) | CUTANEOUS | 1 refills | Status: DC

## 2022-08-29 NOTE — Progress Notes (Unsigned)
Subjective:  Patient ID: Catherine Murphy, female    DOB: 02-Apr-1956  Age: 67 y.o. MRN: [id number]  CC: Rash   HPI Catherine Murphy presents for f/up ---  About 5 days ago she placed a heart monitor over her sternum and developed an itchy, red rash.  She has removed the heart monitor but has not treated the rash.    Outpatient Medications Prior to Visit  Medication Sig Dispense Refill   albuterol (VENTOLIN HFA) 108 (90 Base) MCG/ACT inhaler Inhale 2 puffs into the lungs every 6 (six) hours as needed for wheezing or shortness of breath. 18 g 5   azelastine (ASTELIN) 0.1 % nasal spray Place 2 sprays into both nostrils as needed for rhinitis. Use in each nostril as directed     Blood Glucose Monitoring Suppl (ONETOUCH VERIO) w/Device KIT Inject 1 Act into the skin 2 (two) times daily as needed. Use to monitor blood sugar twice a day. E11.9 1 kit 1   Continuous Blood Gluc Receiver (DEXCOM G7 RECEIVER) DEVI 1 Act by Does not apply route daily. 9 each 1   Continuous Blood Gluc Sensor (DEXCOM G6 SENSOR) MISC Change every 10 days 9 each 2   Continuous Blood Gluc Transmit (DEXCOM G6 TRANSMITTER) MISC Change every 3 months 1 each 2   dapagliflozin propanediol (FARXIGA) 10 MG TABS tablet Take 1 tablet (10 mg total) by mouth daily before breakfast. 90 tablet 1   Fluticasone Furoate (ARNUITY ELLIPTA) 200 MCG/ACT AEPB Inhale 1 puff into the lungs daily. 120 each 1   indapamide (LOZOL) 1.25 MG tablet Take 1 tablet (1.25 mg total) by mouth daily. 90 tablet 0   Insulin Pen Needle (B-D UF III MINI PEN NEEDLES) 31G X 5 MM MISC by Does not apply route.     ipratropium (ATROVENT) 0.03 % nasal spray PLACE 2 SPRAYS INTO BOTH NOSTRILS EVERY 12 (TWELVE) HOURS. 30 mL 4   losartan (COZAAR) 100 MG tablet Take 1 tablet (100 mg total) by mouth daily. 90 tablet 1   meloxicam (MOBIC) 15 MG tablet Take 1 tablet (15 mg total) by mouth daily. 30 tablet 1   metFORMIN (GLUCOPHAGE-XR) 500 MG 24 hr tablet TAKE 2 TABLETS BY  MOUTH DAILY  WITH BREAKFAST 180 tablet 1   NOVOLOG 100 UNIT/ML injection For use in pump, total of 60 units per day 60 mL 3   OneTouch Delica Lancets 33G MISC Use to check blood sugar twice a day. E11.9 100 each 0   ONETOUCH VERIO test strip USE 1 STRIP IN THE MORNING, AT NOON, AND AT BEDTIME AS DIRECTED 300 strip 1   pioglitazone (ACTOS) 15 MG tablet Take 15 mg by mouth daily.     simvastatin (ZOCOR) 10 MG tablet TAKE 1 TABLET BY MOUTH AT  BEDTIME 90 tablet 0   umeclidinium-vilanterol (ANORO ELLIPTA) 62.5-25 MCG/ACT AEPB Inhale 1 puff into the lungs daily at 6 (six) AM. 120 each 1   No facility-administered medications prior to visit.    ROS Review of Systems  Constitutional: Negative.  Negative for diaphoresis and fatigue.  HENT: Negative.    Eyes: Negative.   Respiratory:  Negative for cough, chest tightness, shortness of breath and wheezing.   Cardiovascular:  Negative for chest pain, palpitations and leg swelling.  Gastrointestinal:  Negative for abdominal pain, diarrhea, nausea and vomiting.  Endocrine: Negative.   Genitourinary: Negative.  Negative for difficulty urinating.  Musculoskeletal: Negative.   Skin:  Positive for rash.  Neurological: Negative.  Negative  for dizziness and weakness.  Hematological:  Negative for adenopathy. Does not bruise/bleed easily.  Psychiatric/Behavioral: Negative.      Objective:  BP 138/74 (BP Location: Right Arm, Patient Position: Sitting, Cuff Size: Normal)   Pulse 92   Temp 99 F (37.2 C) (Oral)   Resp 16   Ht 5\' 3"  (1.6 m)   Wt 149 lb 6.4 oz (67.8 kg)   SpO2 95%   BMI 26.47 kg/m   BP Readings from Last 3 Encounters:  08/29/22 138/74  08/17/22 (!) 164/80  08/16/22 (!) 160/80    Wt Readings from Last 3 Encounters:  08/29/22 149 lb 6.4 oz (67.8 kg)  08/17/22 153 lb (69.4 kg)  08/16/22 156 lb 6.4 oz (70.9 kg)    Physical Exam Vitals reviewed.  HENT:     Nose: Nose normal.     Mouth/Throat:     Mouth: Mucous membranes are  moist.  Eyes:     General: No scleral icterus.    Conjunctiva/sclera: Conjunctivae normal.  Cardiovascular:     Rate and Rhythm: Normal rate and regular rhythm.     Heart sounds: No murmur heard. Pulmonary:     Effort: Pulmonary effort is normal.     Breath sounds: No stridor. No wheezing, rhonchi or rales.  Abdominal:     General: Abdomen is flat.     Palpations: There is no mass.     Tenderness: There is no abdominal tenderness. There is no guarding.     Hernia: No hernia is present.  Musculoskeletal:        General: Normal range of motion.     Cervical back: Neck supple.     Right lower leg: No edema.     Left lower leg: No edema.  Lymphadenopathy:     Cervical: No cervical adenopathy.  Skin:    Coloration: Skin is not jaundiced.     Findings: Erythema and rash present.  Neurological:     General: No focal deficit present.     Mental Status: She is alert. Mental status is at baseline.  Psychiatric:        Mood and Affect: Mood normal.        Behavior: Behavior normal.     Lab Results  Component Value Date   WBC 4.8 08/18/2022   HGB 13.0 08/18/2022   HCT 38.2 08/18/2022   PLT 249.0 08/18/2022   GLUCOSE 172 (H) 08/18/2022   CHOL 207 (H) 01/14/2022   TRIG 69.0 01/14/2022   HDL 86.70 01/14/2022   LDLCALC 106 (H) 01/14/2022   ALT 13 01/14/2022   AST 18 01/14/2022   NA 139 08/18/2022   K 4.0 08/18/2022   CL 104 08/18/2022   CREATININE 0.56 08/18/2022   BUN 17 08/18/2022   CO2 25 08/18/2022   TSH 3.57 07/28/2022   HGBA1C 7.5 (H) 07/28/2022   MICROALBUR 25.1 (H) 07/28/2022    MM Digital Diagnostic Unilat R  Result Date: 06/14/2022 CLINICAL DATA:  Patient returns today to evaluate RIGHT breast calcifications identified on recent screening mammogram. EXAM: DIGITAL DIAGNOSTIC UNILATERAL RIGHT MAMMOGRAM TECHNIQUE: Right digital diagnostic mammography was performed. COMPARISON:  Previous exams including recent screening mammogram dated 06/01/2022 and earlier  screening mammogram dated 02/11/2020. ACR Breast Density Category c: The breast tissue is heterogeneously dense, which may obscure small masses. FINDINGS: On today's additional diagnostic views, including magnification views, the coarse and round calcifications within the lower RIGHT breast are not significantly changed in extent compared to the earlier screening mammogram  of 02/11/2020 confirming benignity. No suspicious pleomorphic or fine linear branching calcifications are identified. IMPRESSION: No evidence of malignancy. Benign calcifications within the lower RIGHT breast. Patient may return to routine annual bilateral screening mammogram schedule. RECOMMENDATION: Screening mammogram in one year.(Code:[id number]) I have discussed the findings and recommendations with the patient. If applicable, a reminder letter will be sent to the patient regarding the next appointment. BI-RADS CATEGORY  2: Benign. Electronically Signed   By: Franki Cabot M.D.   On: 06/14/2022 11:56    Assessment & Plan:   Catherine Murphy was seen today for rash.  Diagnoses and all orders for this visit:  Allergic contact dermatitis due to adhesives- Will treat with a topical steroid and oral antihistamine. -     fluocinonide-emollient (LIDEX-E) 0.05 % cream; Apply 1 Application topically 2 (two) times daily. -     hydrOXYzine (ATARAX) 10 MG tablet; Take 1 tablet (10 mg total) by mouth every 8 (eight) hours as needed for up to 5 days.  Hypertension associated with diabetes ([place])- Her blood pressure is adequately well-controlled.   I am having Catherine Murphy "Bonnita Nasuti" start on fluocinonide-emollient and hydrOXYzine. I am also having her maintain her B-D UF III MINI PEN NEEDLES, pioglitazone, azelastine, OneTouch Verio, OneTouch Delica Lancets 78H, simvastatin, OneTouch Verio, NovoLOG, Arnuity Ellipta, albuterol, Dexcom G7 Receiver, Dexcom G6 Transmitter, Dexcom G6 Sensor, umeclidinium-vilanterol, dapagliflozin propanediol, losartan,  meloxicam, ipratropium, indapamide, and metFORMIN.  Meds ordered this encounter  Medications   fluocinonide-emollient (LIDEX-E) 0.05 % cream    Sig: Apply 1 Application topically 2 (two) times daily.    Dispense:  60 g    Refill:  1   hydrOXYzine (ATARAX) 10 MG tablet    Sig: Take 1 tablet (10 mg total) by mouth every 8 (eight) hours as needed for up to 5 days.    Dispense:  30 tablet    Refill:  0     Follow-up: Return in about 6 months (around 02/27/2023).  Scarlette Calico, MD

## 2022-08-29 NOTE — Telephone Encounter (Signed)
Pt stated that she has a "horrible itching rash" where her heart momitor was resting. She stated that she has taken the monitor off but her skin is very sensitive and itchy.   OV scheduled for today 1/29 @ 11.20am

## 2022-08-29 NOTE — Patient Instructions (Signed)
Contact Dermatitis Dermatitis is redness, soreness, and swelling (inflammation) of the skin. Contact dermatitis is a reaction to certain substances that touch the skin. There are two types of this condition: Irritant contact dermatitis. This is the most common type. It happens when something irritates your skin, such as when your hands get dry from washing them too often with soap. You can get this type of reaction even if you have not been exposed to the irritant before. Allergic contact dermatitis. This type is caused by a substance that you are allergic to, such as poison ivy. It occurs when you have been exposed to the substance (allergen) and form a sensitivity to it. In some cases, the reaction may start soon after your first exposure to the allergen. In other cases, it may not start until you are exposed to the allergen again. It may then occur every time you are exposed to the allergen in the future. What are the causes? Irritant contact dermatitis is often caused by exposure to: Makeup. Soaps, detergents, and bleaches. Acids. Metal salts, such as nickel. Allergic contact dermatitis is often caused by exposure to: Poisonous plants. Chemicals. Jewelry. Latex. Medicines. Preservatives in products, such as clothes. What increases the risk? You are more likely to get this condition if you have: A job that exposes you to irritants or allergens. Certain medical conditions. These include asthma and eczema. What are the signs or symptoms? Symptoms of this condition may occur in any place on your body that has been touched by the irritant. Symptoms include: Dryness, flaking, or cracking. Redness. Itching. Pain or a burning feeling. Blisters. Drainage of small amounts of blood or clear fluid from skin cracks. With allergic contact dermatitis, there may also be swelling in areas such as the eyelids, mouth, or genitals. How is this diagnosed? This condition is diagnosed with a medical  history and physical exam. A patch skin test may be done to help figure out the cause. If the condition is related to your job, you may need to see an expert in health problems in the workplace (occupational medicine specialist). How is this treated? This condition is treated by staying away from the cause of the reaction and protecting your skin from further contact. Treatment may also include: Steroid creams or ointments. Steroid medicines may need be taken by mouth (orally) in more severe cases. Antibiotics or medicines applied to the skin to kill bacteria (antibacterial ointments). These may be needed if a skin infection is present. Antihistamines. These may be taken orally or put on as a lotion to ease itching. A bandage (dressing). Follow these instructions at home: Skin care Moisturize your skin as needed. Put cool, wet cloths (cool compresses) on the affected areas. Try applying baking soda paste to your skin. Stir water into baking soda until it has the consistency of a paste. Do not scratch your skin. Avoid friction to the affected area. Avoid the use of soaps, perfumes, and dyes. Check the affected areas every day for signs of infection. Check for: More redness, swelling, or pain. More fluid or blood. Warmth. Pus or a bad smell. Medicines Take or apply over-the-counter and prescription medicines only as told by your health care provider. If you were prescribed antibiotics, take or apply them as told by your health care provider. Do not stop using the antibiotic even if you start to feel better. Bathing Try taking a bath with: Epsom salts. Follow the instructions on the packaging. You can get these at your local pharmacy   or grocery store. Baking soda. Pour a small amount into the bath as told by your health care provider. Colloidal oatmeal. Follow the instructions on the packaging. You can get this at your local pharmacy or grocery store. Bathe less often. This may mean bathing  every other day. Bathe in lukewarm water. Avoid using hot water. Bandage care If you were given a dressing, change it as told by your health care provider. Wash your hands with soap and water for at least 20 seconds before and after you change your dressing. If soap and water are not available, use hand sanitizer. General instructions Avoid the substance that caused your reaction. If you do not know what caused it, keep a journal to try to track what caused it. Write down: What you eat and drink. What cosmetics you use. What you wear in the affected area. This includes jewelry. Contact a health care provider if: Your condition does not get better with treatment. Your condition gets worse. You have any signs of infection. You have a fever. You have new symptoms. Your bone or joint under the affected area becomes painful after the skin has healed. Get help right away if: You notice red streaks coming from the affected area. The affected area turns darker. You have trouble breathing. This information is not intended to replace advice given to you by your health care provider. Make sure you discuss any questions you have with your health care provider. Document Revised: 01/21/2022 Document Reviewed: 01/21/2022 [employer]vier Patient Education  2023 [employer]  

## 2022-08-29 NOTE — Therapy (Signed)
OUTPATIENT PHYSICAL THERAPY THORACOLUMBAR TREATMENT   Patient Name: Catherine Murphy MRN: [id number] DOB:1956-02-09, 67 y.o., female Today's Date: 08/29/2022  END OF SESSION:  PT End of Session - 08/29/22 1303     Visit Number 4    Number of Visits 15    Date for PT Re-Evaluation 10/11/22    Authorization Type MCR    Progress Note Due on Visit 10    PT Start Time 1300    PT Stop Time 1340    PT Time Calculation (min) 40 min    Activity Tolerance Patient tolerated treatment well    Behavior During Therapy [facility] for tasks assessed/performed               Past Medical History:  Diagnosis Date   Diabetes mellitus without complication (HCC)    Past Surgical History:  Procedure Laterality Date   CATARACT EXTRACTION Right    COLONOSCOPY     TRIGGER FINGER RELEASE     Patient Active Problem List   Diagnosis Date Noted   Allergic contact dermatitis due to adhesives 08/29/2022   Insulin-requiring or dependent type II diabetes mellitus (HCC) 08/25/2022   Rapid palpitations 08/17/2022   Near syncope 08/17/2022   Hypertension associated with diabetes (HCC) 08/17/2022   Bilateral hip pain 08/08/2022   Chronic pain of left knee 08/08/2022   Chronic renal disease, stage 2, mildly decreased glomerular filtration rate (GFR) between 60-89 mL/min/1.73 square meter 07/29/2022   Microalbuminuria due to type 2 diabetes mellitus (HCC) 07/29/2022   DDD (degenerative disc disease), lumbar 07/29/2022   Chronic bilateral low back pain with bilateral sciatica 07/28/2022   Mild persistent asthma without complication 04/28/2022   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) 11/08/2021   Nuclear sclerotic cataract of left eye 11/08/2021   Pseudophakia, right eye 11/08/2021   Epiretinal membrane 09/28/2021   Hyperlipidemia with target LDL less than 100 03/24/2021   Screening for cervical cancer 03/24/2021   Encounter for long-term (current) use of insulin (HCC) 03/24/2021   Visit  for screening mammogram 03/09/2021   Type II diabetes mellitus with manifestations (HCC) 12/17/2020    PCP: Etta Grandchild, MD   REFERRING PROVIDER: Madelyn Brunner, DO  REFERRING DIAG: M54.42,M54.41,G89.29 (ICD-10-CM) - Chronic bilateral low back pain with bilateral sciatica  Rationale for Evaluation and Treatment: Rehabilitation  EVAL THERAPY DIAG:  Other low back pain  Pain in right hip  Pain in left hip  Muscle weakness (generalized)  ONSET DATE: around 1 1/2 years ago for low back pain  SUBJECTIVE:  SUBJECTIVE STATEMENT: Pain in the legs and hips has been much better  PERTINENT HISTORY:  PERTINENT  PMH / PSH: DM2, HTN, asthma, diabetic retinopathy, DDD, CKD 2, HLD  PAIN:  Are you having pain? Yes: NPRS scale: 0/10 Pain location: low back, bilat hips, and numbness down both legs Pain description: deep with numbness when standing up  Aggravating factors: bending/squatting low, sitting for prolonged periods of times, sidelying   Relieving factors: sleeping with a pillow under her back  PRECAUTIONS: None  WEIGHT BEARING RESTRICTIONS: No  FALLS:  Has patient fallen in last 6 months? No   PLOF: Independent  PATIENT GOALS: bend down without pain  NEXT MD VISIT: 09/19/22  OBJECTIVE:   DIAGNOSTIC FINDINGS:  Imaging: L-spine XR 07/28/22 IMPRESSION: Lower lumbar spine degenerative disc and facet disease.   Bilateral Hip XR in office (AP and frog leg) IMPRESSION:  Presence of mild internal space narrowing and OA bilaterally. Signs of impingement with spurring off acetabulum bilaterally, bone spurring off inner trochanter on right   PATIENT SURVEYS:  FOTO 37% goal 55%  SCREENING FOR RED FLAGS: Bowel or bladder incontinence: No  PALPATION: PAMs L2-L4 painful  TTP paraspinals  bilat, glutes bilat, greater trochanter bilat  LUMBAR ROM:   AROM eval  Flexion WFL  Extension WFL  Right lateral flexion WFL  Left lateral flexion WFL  Right rotation WFL - some pain noted  Left rotation WFL - some pain noted   (Blank rows = not tested)    LOWER EXTREMITY ROM:       Right eval Right 08/25/22  Hip flexion    Hip extension    Hip abduction     Hip adduction    Hip internal rotation Very limited and painful 35 deg (min pain)  Hip external rotation    Knee flexion    Knee extension    Ankle dorsiflexion    Ankle plantarflexion    Ankle inversion    Ankle eversion     (Blank rows = not tested)  LOWER EXTREMITY MMT:    MMT Right eval Left eval  Hip flexion 3+ limited by pain 4+ limited by pain  Hip extension    Hip abduction 4- imited by pain 4- limited by pain  Hip adduction    Hip internal rotation    Hip external rotation    Knee flexion 5 5  Knee extension 4+ 4+   (Blank rows = not tested)  LUMBAR SPECIAL TESTS:   Eval Straight leg raise test: inconclusive, pain noted but cannot say if it was radicular and Slump test: inconclusive, pain noted but cannot say if it was radicular   TODAY'S TREATMENT:  08/29/22 TherEx NuStep L6 x 5 min Standing lumbar extension 10 x 5 sec hold Seated bil hip internal rotation with L4 band 2x10 Seated piriformis stretch (figure-4) 3x30 sec bil Single limb clamshell with L4 band 2x10 bil in hooklying Bridges with L4 band (holding isometric tension) 2x10 Leg Press 75# 3x10 (cues to decrease speed); then single limb 37# 2x10 bil   08/25/22 TherEx NuStep L6 x 5 min Standing lumbar extension 10 x 5 sec hold Seated piriformis stretch (figure-4) 3x30 sec bil Bridges with strap for isometric hip abdct/er 2x10 reps; 5 sec hold Single limb clamshell with L4 band 2x10 bil in hooklying Seated bil hip internal rotation with L4 band 2x10 Leg Press 75# 2x10 (cues to decrease speed); then single limb 37# 2x10  bil   08/23/22 -Nu step seat 5 level  6 x35min  -Standing back extensions hold 5 sec x10 -Standing hip abduction with green band around ankles x15 each leg  -Standing hip extension with green band around ankles x15 each leg  -standing anti rotation with red band in door x10 each direction -standing hip flexion with green band under opposite leg x10 each leg  -leg press machine DL #75 2x15, single leg #37 2x15 each leg  -sit to stands no UE support down slow x10 -seated hip external rotation stretch holding hold for 30 sec x2 each side -supine hip flexion stretch hold for 30 sec x2 each side   Eval HEP creation and review with demonstration and trial set preformed, see below for details    PATIENT EDUCATION: Education details: HEP, PT plan of care Person educated: Patient Education method: Explanation, Demonstration, Verbal cues, and Handouts Education comprehension: verbalized understanding and needs further education   HOME EXERCISE PROGRAM: Access Code: [email or web address] URL: [email or web address] Date: 08/16/2022 Prepared by: Elsie Ra  Exercises - Standing Back Extension at [place]  - 3 x daily - 6 x weekly - 1 sets - 10 reps - 5 sec hold - Supine Bridge  - 2 x daily - 6 x weekly - 1-2 sets - 10 reps - 5 hold - Hip Extension with Resistance Loop  - 2 x daily - 6 x weekly - 1-2 sets - 15 reps - Standing Hip Abduction with Resistance at Ankles and Counter Support  - 2 x daily - 6 x weekly - 1-2 sets - 15 reps - Standing Anti-Rotation Press with Anchored Resistance  - 2 x daily - 6 x weekly - 1-2 sets - 10 reps - Seated Hip External Rotation Stretch  - 2 x daily - 6 x weekly - 1 sets - 3 reps - 30 sec hold  ASSESSMENT:  CLINICAL IMPRESSION:  Pt overall without pain for several days, and feels she is improving.  If decreased pain continues, may be ready for d/c from PT early. Will continue to benefit from PT to maximize function.  OBJECTIVE IMPAIRMENTS: decreased  activity tolerance, difficulty walking, decreased balance, decreased endurance, decreased mobility, decreased ROM, decreased strength, impaired flexibility, impaired LE use, postural dysfunction, and pain.  ACTIVITY LIMITATIONS: bending, lifting, carry, locomotion, cleaning, community activity, driving, and or occupation  PERSONAL FACTORS: PMH / [place]: DM2, HTN, asthma, diabetic retinopathy, DDD, CKD 2, HLD are also affecting patient's functional outcome.  REHAB POTENTIAL: Good  CLINICAL DECISION MAKING: changing/evolving  EVALUATION COMPLEXITY: Moderate    GOALS: Short term PT Goals Target date: 09/13/2022  Pt will be I and compliant with HEP. Baseline:  Goal status: New Pt will decrease pain by 25% overall Baseline: Goal status: New  Long term PT goals Target date:10/11/2022  Pt will improve  hip/knee strength to at least 5-/5 MMT to improve functional strength Baseline: Goal status: New Pt will improve FOTO to at least 55% functional to show improved function Baseline: Goal status: New Pt will reduce pain to overall less than 2-3/10 with usual activity and work activity. Baseline: Goal status: New 4. Bend/squat pain free to improve overall function with ADLs. Goal status: New        PLAN: PT FREQUENCY: 1-2 times per week   PT DURATION: 6-8 weeks  PLANNED INTERVENTIONS (unless contraindicated): aquatic PT, Canalith repositioning, cryotherapy, Electrical stimulation, Iontophoresis with 4 mg/ml dexamethasome, Moist heat, traction, Ultrasound, gait training, Therapeutic exercise, balance training, neuromuscular re-education, patient/family education, prosthetic training, manual techniques, passive ROM, dry needling, taping, vasopnuematic device,  vestibular, spinal manipulations, joint manipulations  PLAN FOR NEXT SESSION: general strength exercises for hips and low back, STM PRN    Laureen Abrahams, PT, DPT 08/29/22 1:40 PM

## 2022-08-29 NOTE — Telephone Encounter (Signed)
Patient has had rash since Friday from heart monitor adhesive. She would like a call back at [phone].

## 2022-08-31 ENCOUNTER — Ambulatory Visit (INDEPENDENT_AMBULATORY_CARE_PROVIDER_SITE_OTHER): Payer: Medicare Other | Admitting: Physical Therapy

## 2022-08-31 ENCOUNTER — Encounter: Payer: Self-pay | Admitting: Physical Therapy

## 2022-08-31 DIAGNOSIS — M25552 Pain in left hip: Secondary | ICD-10-CM

## 2022-08-31 DIAGNOSIS — M6281 Muscle weakness (generalized): Secondary | ICD-10-CM | POA: Diagnosis not present

## 2022-08-31 DIAGNOSIS — M25551 Pain in right hip: Secondary | ICD-10-CM

## 2022-08-31 DIAGNOSIS — M5459 Other low back pain: Secondary | ICD-10-CM | POA: Diagnosis not present

## 2022-08-31 NOTE — Therapy (Signed)
OUTPATIENT PHYSICAL THERAPY TREATMENT  PHYSICAL THERAPY DISCHARGE SUMMARY  Visits from Start of Care: 5  Current functional level related to goals / functional outcomes: See below   Remaining deficits: See below   Education / Equipment: HEP  Plan:  Patient goals were met but one strength measure. Patient is being discharged due to meeting the majority of her goals and feeling ready to transition to independent program.      Patient Name: Catherine Murphy MRN: [id number] DOB:10/03/1955, 67 y.o., female Today's Date: 08/31/2022  END OF SESSION:  PT End of Session - 08/31/22 1345     Visit Number 5    Number of Visits 15    Date for PT Re-Evaluation 10/11/22    Authorization Type MCR    Progress Note Due on Visit 10    PT Start Time 1346    PT Stop Time 1414    PT Time Calculation (min) 28 min    Activity Tolerance Patient tolerated treatment well    Behavior During Therapy [facility] for tasks assessed/performed               Past Medical History:  Diagnosis Date   Diabetes mellitus without complication ([place])    Past Surgical History:  Procedure Laterality Date   CATARACT EXTRACTION Right    COLONOSCOPY     TRIGGER FINGER RELEASE     Patient Active Problem List   Diagnosis Date Noted   Allergic contact dermatitis due to adhesives 08/29/2022   Insulin-requiring or dependent type II diabetes mellitus ([place]) 08/25/2022   Rapid palpitations 08/17/2022   Near syncope 08/17/2022   Hypertension associated with diabetes ([place]) 08/17/2022   Bilateral hip pain 08/08/2022   Chronic pain of left knee 08/08/2022   Chronic renal disease, stage 2, mildly decreased glomerular filtration rate (GFR) between 60-89 mL/min/1.73 square meter 07/29/2022   Microalbuminuria due to type 2 diabetes mellitus ([place]) 07/29/2022   DDD (degenerative disc disease), lumbar 07/29/2022   Chronic bilateral low back pain with bilateral sciatica 07/28/2022   Mild persistent asthma without  complication 37/16/9678   Nonproliferative diabetic retinopathy of both eyes ([place]) 11/08/2021   Nuclear sclerotic cataract of left eye 11/08/2021   Pseudophakia, right eye 11/08/2021   Epiretinal membrane 09/28/2021   Hyperlipidemia with target LDL less than 100 03/24/2021   Screening for cervical cancer 03/24/2021   Encounter for long-term (current) use of insulin ([place]) 03/24/2021   Visit for screening mammogram 03/09/2021   Type II diabetes mellitus with manifestations ([place]) 12/17/2020    PCP: Janith Lima, MD   REFERRING PROVIDER: Elba Barman, DO  REFERRING DIAG: M54.42,M54.41,G89.29 (ICD-10-CM) - Chronic bilateral low back pain with bilateral sciatica  Rationale for Evaluation and Treatment: Rehabilitation  EVAL THERAPY DIAG:  Other low back pain  Pain in right hip  Pain in left hip  Muscle weakness (generalized)  ONSET DATE: around 1 1/2 years ago for low back pain  SUBJECTIVE:  SUBJECTIVE STATEMENT: She reports she is feeling much better and has no pain. She reports she been keeping up with pilates and staying active.   PERTINENT HISTORY:  PERTINENT  PMH / PSH: DM2, HTN, asthma, diabetic retinopathy, DDD, CKD 2, HLD  PAIN:  Are you having pain? Yes: NPRS scale: 0/10 Pain location: low back, bilat hips, and numbness down both legs Pain description: deep with numbness when standing up  Aggravating factors: bending/squatting low, sitting for prolonged periods of times, sidelying   Relieving factors: sleeping with a pillow under her back  PRECAUTIONS: None  WEIGHT BEARING RESTRICTIONS: No  FALLS:  Has patient fallen in last 6 months? No   PLOF: Independent  PATIENT GOALS: bend down without pain  NEXT MD VISIT: 09/19/22  OBJECTIVE:   DIAGNOSTIC FINDINGS:   Imaging: L-spine XR 07/28/22 IMPRESSION: Lower lumbar spine degenerative disc and facet disease.   Bilateral Hip XR in office (AP and frog leg) IMPRESSION:  Presence of mild internal space narrowing and OA bilaterally. Signs of impingement with spurring off acetabulum bilaterally, bone spurring off inner trochanter on right   PATIENT SURVEYS:  FOTO 37% goal 55%  SCREENING FOR RED FLAGS: Bowel or bladder incontinence: No  PALPATION: PAMs L2-L4 painful  TTP paraspinals bilat, glutes bilat, greater trochanter bilat  LUMBAR ROM:   AROM eval  Flexion WFL  Extension WFL  Right lateral flexion WFL  Left lateral flexion WFL  Right rotation WFL - some pain noted  Left rotation WFL - some pain noted   (Blank rows = not tested)    LOWER EXTREMITY ROM:       Right eval Right 08/25/22 Right 08/31/22  Hip flexion     Hip extension     Hip abduction      Hip adduction     Hip internal rotation Very limited and painful 35 deg (min pain) 37 no pain  Hip external rotation     Knee flexion     Knee extension     Ankle dorsiflexion     Ankle plantarflexion     Ankle inversion     Ankle eversion      (Blank rows = not tested)  LOWER EXTREMITY MMT:    MMT Right eval Left eval Right  08/31/22 Left  08/31/22  Hip flexion 3+ limited by pain 4+ limited by pain 4 no pain  5 no pain  Hip extension      Hip abduction 4- imited by pain 4- limited by pain 5- no pain 5- no pain  Hip adduction      Hip internal rotation      Hip external rotation      Knee flexion 5 5    Knee extension 4+ 4+ 5- 5-   (Blank rows = not tested)  LUMBAR SPECIAL TESTS:   Eval Straight leg raise test: inconclusive, pain noted but cannot say if it was radicular and Slump test: inconclusive, pain noted but cannot say if it was radicular   TODAY'S TREATMENT:  08/31/22 TherEx NuStep L6 x 5 min Modified bird dog at countertop x10 each side Low squats x10   Updated measurements and goals Updated  and Reviewed HEP for progressions  08/29/22 TherEx NuStep L6 x 5 min Standing lumbar extension 10 x 5 sec hold Seated bil hip internal rotation with L4 band 2x10 Seated piriformis stretch (figure-4) 3x30 sec bil Single limb clamshell with L4 band 2x10 bil in hooklying Bridges with L4 band (holding isometric tension) 2x10  Leg Press 75# 3x10 (cues to decrease speed); then single limb 37# 2x10 bil   08/25/22 TherEx NuStep L6 x 5 min Standing lumbar extension 10 x 5 sec hold Seated piriformis stretch (figure-4) 3x30 sec bil [place] with strap for isometric hip abdct/er 2x10 reps; 5 sec hold Single limb clamshell with L4 band 2x10 bil in hooklying Seated bil hip internal rotation with L4 band 2x10 Leg Press 75# 2x10 (cues to decrease speed); then single limb 37# 2x10 bil    PATIENT EDUCATION: Education details: HEP, PT plan of care Person educated: Patient Education method: Explanation, Demonstration, Verbal cues, and Handouts Education comprehension: verbalized understanding and needs further education   HOME EXERCISE PROGRAM: Access Code: [email or web address] URL: [email or web address] Date: 08/31/2022 Prepared by: Ivery Quale  Exercises - Standing Back Extension at Wall  - 1 x daily - 3 x weekly - 1 sets - 10 reps - 5 sec hold - Supine Bridge  - 1 x daily - 3 x weekly - 1-2 sets - 10 reps - 5 hold - Hip Extension with Resistance Loop  - 1 x daily - 3 x weekly - 1-2 sets - 15 reps - Standing Hip Abduction with Resistance at Ankles and Counter Support  - 1 x daily - 3 x weekly - 1-2 sets - 15 reps - Standing Anti-Rotation Press with Anchored Resistance  - 1 x daily - 3 x weekly - 1-2 sets - 10 reps - Seated Hip External Rotation Stretch  - 1 x daily - 3 x weekly - 1 sets - 3 reps - 30 sec hold - Modified Bird Dog At Wall  - 1 x daily - 3 x weekly - 1 sets - 10 reps - 5 sec hold - Squat  - 1 x daily - 3 x weekly - 1-2 sets - 10 reps - Supine Dead Bug with Leg Extension  - 1  x daily - 3 x weekly - 1 sets - 10 reps ASSESSMENT:  CLINICAL IMPRESSION:  Pt has reported to several visits without any pain and has shown improvements in strength and range of motion. She is now able to squat down and come back up without pain which was a main concern for her. She has shown good compliance with her HEP and agreed she felt ready to discharge today. She is at a good functional level and had no further questions or concerns.  OBJECTIVE IMPAIRMENTS: decreased activity tolerance, difficulty walking, decreased balance, decreased endurance, decreased mobility, decreased ROM, decreased strength, impaired flexibility, impaired LE use, postural dysfunction, and pain.  ACTIVITY LIMITATIONS: bending, lifting, carry, locomotion, cleaning, community activity, driving, and or occupation  PERSONAL FACTORS: PMH / PSH: DM2, HTN, asthma, diabetic retinopathy, DDD, CKD 2, HLD are also affecting patient's functional outcome.  REHAB POTENTIAL: Good  CLINICAL DECISION MAKING: changing/evolving  EVALUATION COMPLEXITY: Moderate    GOALS: Short term PT Goals Target date: 09/13/2022  Pt will be I and compliant with HEP. Baseline:  Goal status: MET 08/31/22 Pt will decrease pain by 25% overall Baseline: Goal status: MET 08/31/22  Long term PT goals Target date:10/11/2022  Pt will improve  hip/knee strength to at least 5-/5 MMT to improve functional strength Baseline: Goal status: Partially MET 08/31/22 Pt will improve FOTO to at least 55% functional to show improved function Baseline: Goal status: MET 08/31/22 Pt will reduce pain to overall less than 2-3/10 with usual activity and work activity. Baseline: Goal status: MET 08/31/22 4. Bend/squat pain free to improve overall function with  ADLs. Goal status: MET 08/31/22       PLAN: PT FREQUENCY: 1-2 times per week   PT DURATION: 6-8 weeks  PLANNED INTERVENTIONS (unless contraindicated): aquatic PT, Canalith repositioning, cryotherapy,  Electrical stimulation, Iontophoresis with 4 mg/ml dexamethasome, Moist heat, traction, Ultrasound, gait training, Therapeutic exercise, balance training, neuromuscular re-education, patient/family education, prosthetic training, manual techniques, passive ROM, dry needling, taping, vasopnuematic device, vestibular, spinal manipulations, joint manipulations  PLAN FOR NEXT SESSION: discharged to independent program     Samadhi Mahurin, SPT  08/31/22 2:44 PM

## 2022-09-03 ENCOUNTER — Encounter: Payer: Self-pay | Admitting: Internal Medicine

## 2022-09-04 ENCOUNTER — Other Ambulatory Visit: Payer: Self-pay | Admitting: Internal Medicine

## 2022-09-04 DIAGNOSIS — E785 Hyperlipidemia, unspecified: Secondary | ICD-10-CM

## 2022-09-06 ENCOUNTER — Encounter: Payer: Medicare Other | Admitting: Physical Therapy

## 2022-09-07 ENCOUNTER — Telehealth: Payer: Self-pay | Admitting: Internal Medicine

## 2022-09-07 NOTE — Telephone Encounter (Signed)
08/18/22 labs have been faxed to [facility] attn.

## 2022-09-07 NOTE — Telephone Encounter (Signed)
Pt spouse called in requesting pt need verification of the most recent fasting labs. Pt would like it to be fax with Attn: Tim  [phone] F:[phone]

## 2022-09-08 ENCOUNTER — Encounter: Payer: Medicare Other | Admitting: Physical Therapy

## 2022-09-12 NOTE — Telephone Encounter (Signed)
Labs have been printed, signed off as fasting and faxed.

## 2022-09-12 NOTE — Telephone Encounter (Signed)
PT and spouse visit today in regards to this. These lab results were needing to be sent to a [facility] group. They had not received the last fax.   PT and spouse note that these labs needed to be noted as fasting labs or they would not count. They also require these to be signed off by Dr.Jones. They require both of these be done before PT can receive their diabetic supplies. The fax number provided to me is  FAX: [phone]

## 2022-09-14 ENCOUNTER — Ambulatory Visit: Payer: Medicare Other | Admitting: Internal Medicine

## 2022-09-14 NOTE — Telephone Encounter (Signed)
They need to state fasting and concurrent - sign and date  Please fax to [phone]

## 2022-09-14 NOTE — Telephone Encounter (Signed)
Patient's husband called back.  Labs need to be printed off again,  labeled as fasting, signed by the doctor and dated.  FAX #  [phone] -

## 2022-09-14 NOTE — Telephone Encounter (Signed)
Labs have been faxed again to [phone]

## 2022-09-16 ENCOUNTER — Telehealth: Payer: Self-pay | Admitting: Endocrinology

## 2022-09-16 ENCOUNTER — Telehealth: Payer: Self-pay | Admitting: Dietician

## 2022-09-16 NOTE — Telephone Encounter (Signed)
New message    Sample medication pick up by patient

## 2022-09-16 NOTE — Telephone Encounter (Signed)
Returned patient call.  She is a medicare patient and her supplies have not been shipped to her yet and she is out of the t:slim cartridges.   We have 2 and she will pick up today.  Antonieta Iba, RD, LDN, CDCES

## 2022-09-19 ENCOUNTER — Ambulatory Visit (INDEPENDENT_AMBULATORY_CARE_PROVIDER_SITE_OTHER): Payer: Medicare Other | Admitting: Sports Medicine

## 2022-09-19 ENCOUNTER — Encounter: Payer: Self-pay | Admitting: Sports Medicine

## 2022-09-19 DIAGNOSIS — R29898 Other symptoms and signs involving the musculoskeletal system: Secondary | ICD-10-CM

## 2022-09-19 DIAGNOSIS — M5442 Lumbago with sciatica, left side: Secondary | ICD-10-CM

## 2022-09-19 DIAGNOSIS — M76811 Anterior tibial syndrome, right leg: Secondary | ICD-10-CM

## 2022-09-19 DIAGNOSIS — G8929 Other chronic pain: Secondary | ICD-10-CM

## 2022-09-19 DIAGNOSIS — M76812 Anterior tibial syndrome, left leg: Secondary | ICD-10-CM | POA: Diagnosis not present

## 2022-09-19 DIAGNOSIS — M5441 Lumbago with sciatica, right side: Secondary | ICD-10-CM

## 2022-09-19 NOTE — Telephone Encounter (Signed)
Catherine Murphy:  Fax [phone]

## 2022-09-19 NOTE — Progress Notes (Signed)
Catherine Murphy - 67 y.o. female MRN [id number]  Date of birth: Dec 29, 1955  Office Visit Note: Visit Date: 09/19/2022 PCP: Janith Lima, MD Referred by: Janith Lima, MD  Subjective: Chief Complaint  Patient presents with   Lower Back - Follow-up   HPI: Catherine Murphy is a pleasant 67 y.o. female who presents today for follow-up of chronic low back pain.  Seen back on 08/08/22 - had low back pain with radiation of pain into bilateral thighs.  She was sent to physical therapy - she made good progress and was discharged on 1/09/30/22 as goals were met (except one strength measure) and transitioned to HEP. On Meloxicam 76m.   Low back pain -she states this is much improved.  Currently she is not having any low back pain.  She reports resolution of her radiation pain into the thighs.  She also has noticed an improvement in her leg strength.  She has transition to a home rehab program and is doing Pilates at least twice weekly which is helping.  Bilateral leg pain, anterior -here over the last week or so she has noticed some pain and what feels like a swelling or tightness over bilateral anterior shins.  Feels more so in the muscle belly, denies any muscle pain.  She denies any redness.  No known injury.  Pertinent ROS were reviewed with the patient and found to be negative unless otherwise specified above in HPI.   Assessment & Plan: Visit Diagnoses:  1. Chronic bilateral low back pain with bilateral sciatica   2. Weakness of both hips   3. Anterior tibialis tendinitis of left lower extremity   4. Anterior tibialis tendinitis of right lower extremity    Plan: Discussed with HBonnita Nasutithat we both are pleased she has had/improvement in her back pain as well as strengthening of her low back and hips.  I would like her to continue her home rehab exercises as well as Pilates or tai chi at least 2-3 times weekly for maintenance and prevention.  At this point, we will discontinue the meloxicam.   She may take this only as needed if she has a few days of soreness or pain.  Did review her bilateral shin pain, which seems very indicative of tibialis anterior tendinitis, likely from her change in activity.  It provide a Thera-Band and some stretching exercises for her to work on at home.  Also recommended topical medications and soft tissue massage.  She will follow-up in 1 month if not improved.  Follow-up: Return in about 1 month (around 10/18/2022) for if not improving .   Meds & Orders:  - discontinue Meloxicam 145m    Procedures: No procedures performed      Clinical History: No specialty comments available.  She reports that she has never smoked. She has never used smokeless tobacco.  Recent Labs    01/06/22 1627 07/28/22 1335  HGBA1C 7.3* 7.5*    Objective:    Physical Exam  Gen: Well-appearing, in no acute distress; non-toxic CV: Well-perfused. Warm.  Resp: Breathing unlabored on room air; no wheezing. Psych: Fluid speech in conversation; appropriate affect; normal thought process Neuro: Sensation intact throughout. No gross coordination deficits.   Ortho Exam - Low back: No midline spinous process TTP.  There is full range of motion with flexion and extension.  5/5 strength of bilateral lower extremities.  Negative straight leg raise.  - Bilateral hips: No TTP over either greater trochanter.  There is  fluid range of motion with internal and external rotation.  FADIR and FABER do not cause any pain today.   - Bilateral shins/LE's: + TTP with some tightness over the mid belly of the anterior tibialis tendon bilaterally.  There is no redness or swelling.  There is pain with endrange dorsiflexion. NVI.  Imaging: No results found.  Past Medical/Family/Surgical/Social History: Medications & Allergies reviewed per EMR, new medications updated. Patient Active Problem List   Diagnosis Date Noted   Allergic contact dermatitis due to adhesives 08/29/2022    Insulin-requiring or dependent type II diabetes mellitus ([place]) 08/25/2022   Rapid palpitations 08/17/2022   Near syncope 08/17/2022   Hypertension associated with diabetes ([place]) 08/17/2022   Bilateral hip pain 08/08/2022   Chronic pain of left knee 08/08/2022   Chronic renal disease, stage 2, mildly decreased glomerular filtration rate (GFR) between 60-89 mL/min/1.73 square meter 07/29/2022   Microalbuminuria due to type 2 diabetes mellitus ([place]) 07/29/2022   DDD (degenerative disc disease), lumbar 07/29/2022   Chronic bilateral low back pain with bilateral sciatica 07/28/2022   Mild persistent asthma without complication [id number]   Nonproliferative diabetic retinopathy of both eyes ([place]) 11/08/2021   Nuclear sclerotic cataract of left eye 11/08/2021   Pseudophakia, right eye 11/08/2021   Epiretinal membrane 09/28/2021   Hyperlipidemia with target LDL less than 100 03/24/2021   Screening for cervical cancer 03/24/2021   Encounter for long-term (current) use of insulin ([place]) 03/24/2021   Visit for screening mammogram 03/09/2021   Type II diabetes mellitus with manifestations ([place]) 12/17/2020   Past Medical History:  Diagnosis Date   Diabetes mellitus without complication ([place])    Family History  Problem Relation Age of Onset   Ovarian cancer Mother    Glaucoma Father    Colon cancer Father    Diabetes Neg Hx    Past Surgical History:  Procedure Laterality Date   CATARACT EXTRACTION Right    COLONOSCOPY     TRIGGER FINGER RELEASE     Social History   Occupational History   Not on file  Tobacco Use   Smoking status: Never   Smokeless tobacco: Never  Vaping Use   Vaping Use: Never used  Substance and Sexual Activity   Alcohol use: Not Currently   Drug use: Never   Sexual activity: Yes    Birth control/protection: Post-menopausal

## 2022-09-19 NOTE — Telephone Encounter (Signed)
Labs have been faxed again.   If they are not received this time. Please ask for address for labs to be mailed to company.

## 2022-09-19 NOTE — Telephone Encounter (Signed)
Patient called again this morning.

## 2022-10-03 ENCOUNTER — Other Ambulatory Visit: Payer: Self-pay | Admitting: Internal Medicine

## 2022-10-03 DIAGNOSIS — J4530 Mild persistent asthma, uncomplicated: Secondary | ICD-10-CM

## 2022-10-19 ENCOUNTER — Telehealth: Payer: Self-pay | Admitting: Internal Medicine

## 2022-10-19 NOTE — Telephone Encounter (Signed)
Patient's husband called and said her blood sugar is high and has been within last 24 hours. Patient has been taking insulin as usual, but doesn't seem to be working. He is going to take her to urgent care, but would like to know if she needs a different type of insulin or a different dosage. Best callback for patient is [phone].

## 2022-10-19 NOTE — Telephone Encounter (Signed)
Soke w/ husband (Sam) he states wife is on insulin pump, but her BS been elevated. Check BS it was 173 been like that fir 3 hours. Inform pt since Dr. Dwyane Dee is pt endocrinologist pt need to reach out to him. He states he has tried calling no RTC. Wanting to ask Dr. Ronnald Ramp what should he do.Marland KitchenJohny Chess

## 2022-10-20 NOTE — Telephone Encounter (Signed)
Called husband he states they was able to talked w/ Vaughan Basta w/Dr. Dwyane Dee office. Everything is taken care of.Marland KitchenJohny Chess

## 2022-10-21 ENCOUNTER — Telehealth: Payer: Self-pay

## 2022-10-21 NOTE — Telephone Encounter (Signed)
Attempted to contact the pt and her spouse regarding blood sugars and insulin absorption concerns. LVM for a call back.

## 2022-10-27 ENCOUNTER — Ambulatory Visit (INDEPENDENT_AMBULATORY_CARE_PROVIDER_SITE_OTHER): Payer: Medicare Other | Admitting: Internal Medicine

## 2022-10-27 ENCOUNTER — Encounter: Payer: Self-pay | Admitting: Internal Medicine

## 2022-10-27 ENCOUNTER — Other Ambulatory Visit: Payer: Self-pay | Admitting: Internal Medicine

## 2022-10-27 VITALS — BP 136/78 | HR 80 | Temp 98.6°F | Ht 63.0 in | Wt 148.0 lb

## 2022-10-27 DIAGNOSIS — Z23 Encounter for immunization: Secondary | ICD-10-CM

## 2022-10-27 DIAGNOSIS — E2839 Other primary ovarian failure: Secondary | ICD-10-CM

## 2022-10-27 DIAGNOSIS — F411 Generalized anxiety disorder: Secondary | ICD-10-CM

## 2022-10-27 DIAGNOSIS — J301 Allergic rhinitis due to pollen: Secondary | ICD-10-CM | POA: Insufficient documentation

## 2022-10-27 DIAGNOSIS — J4530 Mild persistent asthma, uncomplicated: Secondary | ICD-10-CM

## 2022-10-27 MED ORDER — ARNUITY ELLIPTA 200 MCG/ACT IN AEPB: 1.0000 | INHALATION_SPRAY | Freq: Every day | RESPIRATORY_TRACT | 1 refills | Status: DC

## 2022-10-27 MED ORDER — MONTELUKAST SODIUM 10 MG PO TABS: 10.0000 mg | ORAL_TABLET | Freq: Every day | ORAL | 1 refills | Status: DC

## 2022-10-27 MED ORDER — BOOSTRIX 5-2.5-18.5 LF-MCG/0.5 IM SUSP: 0.5000 mL | Freq: Once | INTRAMUSCULAR | 0 refills | Status: AC

## 2022-10-27 MED ORDER — HYDROXYZINE HCL 10 MG PO TABS: 10.0000 mg | ORAL_TABLET | Freq: Three times a day (TID) | ORAL | 0 refills | Status: DC | PRN

## 2022-10-27 MED ORDER — AZELASTINE HCL 0.1 % NA SOLN: 2.0000 | Freq: Two times a day (BID) | NASAL | 1 refills | Status: DC

## 2022-10-27 MED ORDER — LEVOCETIRIZINE DIHYDROCHLORIDE 5 MG PO TABS: 5.0000 mg | ORAL_TABLET | Freq: Every evening | ORAL | 1 refills | Status: DC

## 2022-10-27 NOTE — Progress Notes (Signed)
Subjective:  Patient ID: Catherine Murphy, female    DOB: [birth year]  Age: 67 y.o. MRN: [id number]  CC: Allergic Rhinitis , Asthma, Hypertension, and Diabetes   HPI Catherine Murphy presents for f/up ---  She exercises every day and has good endurance.  She denies chest pain, diaphoresis, or edema.    Outpatient Medications Prior to Visit  Medication Sig Dispense Refill   albuterol (VENTOLIN HFA) 108 (90 Base) MCG/ACT inhaler Inhale 2 puffs into the lungs every 6 (six) hours as needed for wheezing or shortness of breath. 18 g 5   Blood Glucose Monitoring Suppl (ONETOUCH VERIO) w/Device KIT Inject 1 Act into the skin 2 (two) times daily as needed. Use to monitor blood sugar twice a day. E11.9 1 kit 1   Continuous Blood Gluc Receiver (DEXCOM G7 RECEIVER) DEVI 1 Act by Does not apply route daily. 9 each 1   Continuous Blood Gluc Sensor (DEXCOM G6 SENSOR) MISC Change every 10 days 9 each 2   Continuous Blood Gluc Transmit (DEXCOM G6 TRANSMITTER) MISC Change every 3 months 1 each 2   dapagliflozin propanediol (FARXIGA) 10 MG TABS tablet Take 1 tablet (10 mg total) by mouth daily before breakfast. 90 tablet 1   indapamide (LOZOL) 1.25 MG tablet Take 1 tablet (1.25 mg total) by mouth daily. 90 tablet 0   Insulin Pen Needle (B-D UF III MINI PEN NEEDLES) 31G X 5 MM MISC by Does not apply route.     ipratropium (ATROVENT) 0.03 % nasal spray PLACE 2 SPRAYS INTO BOTH NOSTRILS EVERY 12 (TWELVE) HOURS. 30 mL 4   losartan (COZAAR) 100 MG tablet Take 1 tablet (100 mg total) by mouth daily. 90 tablet 1   metFORMIN (GLUCOPHAGE-XR) 500 MG 24 hr tablet TAKE 2 TABLETS BY MOUTH DAILY  WITH BREAKFAST 180 tablet 1   NOVOLOG 100 UNIT/ML injection For use in pump, total of 60 units per day 60 mL 3   OneTouch Delica Lancets 99991111 MISC Use to check blood sugar twice a day. E11.9 100 each 0   ONETOUCH VERIO test strip USE 1 STRIP IN THE MORNING, AT NOON, AND AT BEDTIME AS DIRECTED 300 strip 1   pioglitazone (ACTOS) 15  MG tablet Take 15 mg by mouth daily.     simvastatin (ZOCOR) 10 MG tablet TAKE 1 TABLET BY MOUTH AT  BEDTIME 90 tablet 1   umeclidinium-vilanterol (ANORO ELLIPTA) 62.5-25 MCG/ACT AEPB INHALE 1 INHALATION BY MOUTH  DAILY AT 6 AM 180 each 0   azelastine (ASTELIN) 0.1 % nasal spray Place 2 sprays into both nostrils as needed for rhinitis. Use in each nostril as directed     fluocinonide-emollient (LIDEX-E) 0.05 % cream Apply 1 Application topically 2 (two) times daily. 60 g 1   Fluticasone Furoate (ARNUITY ELLIPTA) 200 MCG/ACT AEPB Inhale 1 puff into the lungs daily. 120 each 1   No facility-administered medications prior to visit.    ROS Review of Systems  Constitutional: Negative.  Negative for diaphoresis, fatigue and unexpected weight change.  HENT:  Positive for congestion, postnasal drip and rhinorrhea. Negative for nosebleeds, sinus pressure, sinus pain and sore throat.   Eyes: Negative.   Respiratory: Negative.  Negative for cough, chest tightness, shortness of breath and wheezing.   Cardiovascular:  Negative for chest pain, palpitations and leg swelling.  Gastrointestinal:  Negative for abdominal pain, constipation, diarrhea, nausea and vomiting.  Endocrine: Negative.   Genitourinary: Negative.  Negative for difficulty urinating.  Musculoskeletal: Negative.  Negative  for arthralgias and myalgias.  Skin: Negative.   Neurological:  Negative for dizziness, weakness, light-headedness and headaches.  Hematological:  Negative for adenopathy. Does not bruise/bleed easily.  Psychiatric/Behavioral:  Negative for confusion, decreased concentration, dysphoric mood, hallucinations, sleep disturbance and suicidal ideas. The patient is nervous/anxious.     Objective:  BP 136/78 (BP Location: Left Arm, Patient Position: Sitting, Cuff Size: Large)   Pulse 80   Temp 98.6 F (37 C) (Oral)   Ht 5\' 3"  (1.6 m)   Wt 148 lb (67.1 kg)   SpO2 95%   BMI 26.22 kg/m   BP Readings from Last 3  Encounters:  10/27/22 136/78  08/29/22 138/74  08/17/22 (!) 164/80    Wt Readings from Last 3 Encounters:  10/27/22 148 lb (67.1 kg)  08/29/22 149 lb 6.4 oz (67.8 kg)  08/17/22 153 lb (69.4 kg)    Physical Exam Vitals reviewed.  HENT:     Nose: Nose normal.     Mouth/Throat:     Mouth: Mucous membranes are moist.  Eyes:     General: No scleral icterus.    Conjunctiva/sclera: Conjunctivae normal.  Cardiovascular:     Rate and Rhythm: Normal rate and regular rhythm.     Pulses: Normal pulses.     Heart sounds: No murmur heard.    No friction rub. No gallop.  Pulmonary:     Effort: Pulmonary effort is normal.     Breath sounds: No stridor. No wheezing, rhonchi or rales.  Abdominal:     General: Abdomen is flat.     Palpations: There is no mass.     Tenderness: There is no abdominal tenderness. There is no guarding.     Hernia: No hernia is present.  Musculoskeletal:        General: Normal range of motion.     Cervical back: Neck supple.     Right lower leg: No edema.     Left lower leg: No edema.  Lymphadenopathy:     Cervical: No cervical adenopathy.  Skin:    General: Skin is warm and dry.  Neurological:     General: No focal deficit present.     Mental Status: She is alert. Mental status is at baseline.  Psychiatric:        Mood and Affect: Mood normal.        Behavior: Behavior normal.     Lab Results  Component Value Date   WBC 4.8 08/18/2022   HGB 13.0 08/18/2022   HCT 38.2 08/18/2022   PLT 249.0 08/18/2022   GLUCOSE 172 (H) 08/18/2022   CHOL 207 (H) 01/14/2022   TRIG 69.0 01/14/2022   HDL 86.70 01/14/2022   LDLCALC 106 (H) 01/14/2022   ALT 13 01/14/2022   AST 18 01/14/2022   NA 139 08/18/2022   K 4.0 08/18/2022   CL 104 08/18/2022   CREATININE 0.56 08/18/2022   BUN 17 08/18/2022   CO2 25 08/18/2022   TSH 3.57 07/28/2022   HGBA1C 7.5 (H) 07/28/2022   MICROALBUR 25.1 (H) 07/28/2022    MM Digital Diagnostic Unilat R  Result Date:  06/14/2022 CLINICAL DATA:  Patient returns today to evaluate RIGHT breast calcifications identified on recent screening mammogram. EXAM: DIGITAL DIAGNOSTIC UNILATERAL RIGHT MAMMOGRAM TECHNIQUE: Right digital diagnostic mammography was performed. COMPARISON:  Previous exams including recent screening mammogram dated 06/01/2022 and earlier screening mammogram dated 02/11/2020. ACR Breast Density Category c: The breast tissue is heterogeneously dense, which may obscure small masses. FINDINGS: On  today's additional diagnostic views, including magnification views, the coarse and round calcifications within the lower RIGHT breast are not significantly changed in extent compared to the earlier screening mammogram of 02/11/2020 confirming benignity. No suspicious pleomorphic or fine linear branching calcifications are identified. IMPRESSION: No evidence of malignancy. Benign calcifications within the lower RIGHT breast. Patient may return to routine annual bilateral screening mammogram schedule. RECOMMENDATION: Screening mammogram in one year.(Code:[id number]-B-01Y) I have discussed the findings and recommendations with the patient. If applicable, a reminder letter will be sent to the patient regarding the next appointment. BI-RADS CATEGORY  2: Benign. Electronically Signed   By: Franki Cabot M.D.   On: 06/14/2022 11:56    Assessment & Plan:  Mild persistent asthma without complication- Her symptoms are well-controlled. -     Montelukast Sodium; Take 1 tablet (10 mg total) by mouth at bedtime.  Dispense: 90 tablet; Refill: 1 -     Arnuity Ellipta; Inhale 1 puff into the lungs daily.  Dispense: 120 each; Refill: 1  Seasonal allergic rhinitis due to pollen -     Montelukast Sodium; Take 1 tablet (10 mg total) by mouth at bedtime.  Dispense: 90 tablet; Refill: 1 -     Azelastine HCl; Place 2 sprays into both nostrils 2 (two) times daily. Use in each nostril as directed  Dispense: 90 mL; Refill: 1  Estrogen deficiency -      DG Bone Density; Future  Need for prophylactic vaccination with combined diphtheria-tetanus-pertussis (DTP) vaccine -     Boostrix; Inject 0.5 mLs into the muscle once for 1 dose.  Dispense: 0.5 mL; Refill: 0  GAD (generalized anxiety disorder)- She will take hydroxyzine as needed.     Follow-up: Return in about 4 months (around 02/26/2023).  Scarlette Calico, MD

## 2022-10-27 NOTE — Patient Instructions (Signed)
Asthma, Adult  Asthma is a long-term (chronic) condition that causes recurrent episodes in which the lower airways in the lungs become tight and narrow. The narrowing is caused by inflammation and tightening of the smooth muscle around the lower airways. Asthma episodes, also called asthma attacks or asthma flares, may cause coughing, making high-pitched whistling sounds when you breathe, most often when you breathe out (wheezing), shortness of breath, and chest pain. The airways may produce extra mucus caused by the inflammation and irritation. During an attack, it can be difficult to breathe. Asthma attacks can range from minor to life-threatening. Asthma cannot be cured, but medicines and lifestyle changes can help control it and treat acute attacks. It is important to keep your asthma well controlled so the condition does not interfere with your daily life. What are the causes? This condition is believed to be caused by inherited (genetic) and environmental factors, but its exact cause is not known. What can trigger an asthma attack? Many things can bring on an asthma attack or make symptoms worse. These triggers are different for every person. Common triggers include: Allergens and irritants like mold, dust, pet dander, cockroaches, pollen, air pollution, and chemical odors. Cigarette smoke. Weather changes and cold air. Stress and strong emotional responses such as crying or laughing hard. Certain medications such as aspirin or beta blockers. Infections and inflammatory conditions, such as the flu, a cold, pneumonia, or inflammation of the nasal membranes (rhinitis). Gastroesophageal reflux disease (GERD). What are the signs or symptoms? Symptoms may occur right after exposure to an asthma trigger or hours later and can vary by person. Common signs and symptoms include: Wheezing. Trouble breathing (shortness of breath). Excessive nighttime or early morning coughing. Chest  tightness. Tiredness (fatigue) with minimal activity. Difficulty talking in complete sentences. Poor exercise tolerance. How is this diagnosed? This condition is diagnosed based on: A physical exam and your medical history. Tests, which may include: Lung function studies to evaluate the flow of air in your lungs. Allergy tests. Imaging tests, such as X-rays. How is this treated? There is no cure, but symptoms can be controlled with proper treatment. Treatment usually involves: Identifying and avoiding your asthma triggers. Inhaled medicines. Two types are commonly used to treat asthma, depending on severity: Controller medicines. These help prevent asthma symptoms from occurring. They are taken every day. Fast-acting reliever or rescue medicines. These quickly relieve asthma symptoms. They are used as needed and provide short-term relief. Using other medicines, such as: Allergy medicines, such as antihistamines, if your asthma attacks are triggered by allergens. Immune medicines (immunomodulators). These are medicines that help control the immune system. Using supplemental oxygen. This is only needed during a severe episode. Creating an asthma action plan. An asthma action plan is a written plan for managing and treating your asthma attacks. This plan includes: A list of your asthma triggers and how to avoid them. Information about when medicines should be taken and when their dosage should be changed. Instructions about using a device called a peak flow meter. A peak flow meter measures how well the lungs are working and the severity of your asthma. It helps you monitor your condition. Follow these instructions at home: Take over-the-counter and prescription medicines only as told by your health care provider. Stay up to date on all vaccinations as recommended by your healthcare provider, including vaccines for the flu and pneumonia. Use a peak flow meter and keep track of your peak flow  readings. Understand and use your asthma   action plan to address any asthma flares. Do not smoke or allow anyone to smoke in your home. Contact a health care provider if: You have wheezing, shortness of breath, or a cough that is not responding to medicines. Your medicines are causing side effects, such as a rash, itching, swelling, or trouble breathing. You need to use a reliever medicine more than 2-3 times a week. Your peak flow reading is still at 50-79% of your personal best after following your action plan for 1 hour. You have a fever and shortness of breath. Get help right away if: You are getting worse and do not respond to treatment during an asthma attack. You are short of breath when at rest or when doing very little physical activity. You have difficulty eating, drinking, or talking. You have chest pain or tightness. You develop a fast heartbeat or palpitations. You have a bluish color to your lips or fingernails. You are light-headed or dizzy, or you faint. Your peak flow reading is less than 50% of your personal best. You feel too tired to breathe normally. These symptoms may be an emergency. Get help right away. Call 911. Do not wait to see if the symptoms will go away. Do not drive yourself to the hospital. Summary Asthma is a long-term (chronic) condition that causes recurrent episodes in which the airways become tight and narrow. Asthma episodes, also called asthma attacks or asthma flares, can cause coughing, wheezing, shortness of breath, and chest pain. Asthma cannot be cured, but medicines and lifestyle changes can help keep it well controlled and prevent asthma flares. Make sure you understand how to avoid triggers and how and when to use your medicines. Asthma attacks can range from minor to life-threatening. Get help right away if you have an asthma attack and do not respond to treatment with your usual rescue medicines. This information is not intended to replace  advice given to you by your health care provider. Make sure you discuss any questions you have with your health care provider. Document Revised: 05/05/2021 Document Reviewed: 04/26/2021 Elsevier Patient Education  2023 [employer] Inc.  

## 2022-10-29 ENCOUNTER — Other Ambulatory Visit: Payer: Self-pay | Admitting: Internal Medicine

## 2022-10-29 DIAGNOSIS — F411 Generalized anxiety disorder: Secondary | ICD-10-CM

## 2022-10-29 DIAGNOSIS — J301 Allergic rhinitis due to pollen: Secondary | ICD-10-CM

## 2022-10-30 ENCOUNTER — Other Ambulatory Visit: Payer: Self-pay | Admitting: Internal Medicine

## 2022-10-30 DIAGNOSIS — F411 Generalized anxiety disorder: Secondary | ICD-10-CM

## 2022-10-30 DIAGNOSIS — J301 Allergic rhinitis due to pollen: Secondary | ICD-10-CM

## 2022-11-04 ENCOUNTER — Other Ambulatory Visit: Payer: Self-pay | Admitting: Internal Medicine

## 2022-11-04 DIAGNOSIS — I10 Essential (primary) hypertension: Secondary | ICD-10-CM

## 2022-11-04 DIAGNOSIS — E118 Type 2 diabetes mellitus with unspecified complications: Secondary | ICD-10-CM

## 2022-11-10 ENCOUNTER — Encounter (INDEPENDENT_AMBULATORY_CARE_PROVIDER_SITE_OTHER): Payer: Self-pay

## 2022-11-10 ENCOUNTER — Encounter (INDEPENDENT_AMBULATORY_CARE_PROVIDER_SITE_OTHER): Payer: BC Managed Care – PPO | Admitting: Ophthalmology

## 2022-11-10 ENCOUNTER — Other Ambulatory Visit: Payer: Self-pay | Admitting: Internal Medicine

## 2022-11-10 ENCOUNTER — Telehealth: Payer: Self-pay | Admitting: Internal Medicine

## 2022-11-10 DIAGNOSIS — H2512 Age-related nuclear cataract, left eye: Secondary | ICD-10-CM

## 2022-11-10 DIAGNOSIS — E113293 Type 2 diabetes mellitus with mild nonproliferative diabetic retinopathy without macular edema, bilateral: Secondary | ICD-10-CM

## 2022-11-10 DIAGNOSIS — E119 Type 2 diabetes mellitus without complications: Secondary | ICD-10-CM

## 2022-11-10 DIAGNOSIS — Z961 Presence of intraocular lens: Secondary | ICD-10-CM

## 2022-11-10 DIAGNOSIS — H35371 Puckering of macula, right eye: Secondary | ICD-10-CM

## 2022-11-10 NOTE — Telephone Encounter (Signed)
Patient is supposed to see an ophthalmologist for diabetic eye exams, but the one she has been seeing has changed practices. They would like to know if Dr. Yetta Barre can send in a referral to someone in network. Best callback is [phone].

## 2022-11-14 ENCOUNTER — Telehealth: Payer: Self-pay | Admitting: Internal Medicine

## 2022-11-14 ENCOUNTER — Other Ambulatory Visit: Payer: Self-pay | Admitting: Internal Medicine

## 2022-11-14 DIAGNOSIS — E1129 Type 2 diabetes mellitus with other diabetic kidney complication: Secondary | ICD-10-CM

## 2022-11-14 DIAGNOSIS — I10 Essential (primary) hypertension: Secondary | ICD-10-CM

## 2022-11-14 DIAGNOSIS — E119 Type 2 diabetes mellitus without complications: Secondary | ICD-10-CM

## 2022-11-14 DIAGNOSIS — J4530 Mild persistent asthma, uncomplicated: Secondary | ICD-10-CM

## 2022-11-14 DIAGNOSIS — J301 Allergic rhinitis due to pollen: Secondary | ICD-10-CM

## 2022-11-14 DIAGNOSIS — E118 Type 2 diabetes mellitus with unspecified complications: Secondary | ICD-10-CM

## 2022-11-14 DIAGNOSIS — E1065 Type 1 diabetes mellitus with hyperglycemia: Secondary | ICD-10-CM

## 2022-11-14 MED ORDER — LOSARTAN POTASSIUM 100 MG PO TABS: 100.0000 mg | ORAL_TABLET | Freq: Every day | ORAL | 1 refills | Status: DC

## 2022-11-14 MED ORDER — NOVOLOG 100 UNIT/ML IJ SOLN: INTRAMUSCULAR | 1 refills | Status: DC

## 2022-11-14 MED ORDER — ONETOUCH VERIO VI STRP
ORAL_STRIP | 1 refills | Status: DC
Start: 2022-11-14 — End: 2023-01-11

## 2022-11-14 MED ORDER — MONTELUKAST SODIUM 10 MG PO TABS: 10.0000 mg | ORAL_TABLET | Freq: Every day | ORAL | 1 refills | Status: DC

## 2022-11-14 MED ORDER — ARNUITY ELLIPTA 200 MCG/ACT IN AEPB: 1.0000 | INHALATION_SPRAY | Freq: Every day | RESPIRATORY_TRACT | 1 refills | Status: DC

## 2022-11-14 MED ORDER — ONETOUCH DELICA LANCETS 33G MISC: 0 refills | Status: DC

## 2022-11-14 MED ORDER — METFORMIN HCL ER 500 MG PO TB24: 1000.0000 mg | ORAL_TABLET | Freq: Every day | ORAL | 1 refills | Status: DC

## 2022-11-14 NOTE — Telephone Encounter (Signed)
Prescription Request  11/14/2022  LOV: 10/27/2022  What is the name of the medication or equipment? NOVOLOG 100 UNIT/ML injection   Have you contacted your pharmacy to request a refill? Yes   Which pharmacy would you like this sent to?  [facility] on [address] in [place]    Patient notified that their request is being sent to the clinical staff for review and that they should receive a response within 2 business days.   Please advise at Mobile [phone] (mobile)   Patient is transferring medications to new pharmacy and insurance requires a new order, not a transfer. Please make sure to include the diagnosis code

## 2022-11-14 NOTE — Telephone Encounter (Signed)
Prescription Request  11/14/2022  LOV: 10/27/2022  What is the name of the medication or equipment? Anoro Ellipta 62.5-25, losartan 100mg , metformin 500mg , montelukast 10mg . Onetouch delica lancets 33g, onetouch verio test strips  Have you contacted your pharmacy to request a refill? Yes   Which pharmacy would you like this sent to?  [id number] [place], [place] - [address] AT [facility] OF [facility] [address] & [facility] [address] [place] [address] Phone: [phone] Fax: [phone]    Patient notified that their request is being sent to the clinical staff for review and that they should receive a response within 2 business days.   Please advise at Mobile [phone] (mobile)

## 2022-11-15 LAB — HM DIABETES EYE EXAM

## 2022-11-16 ENCOUNTER — Other Ambulatory Visit (INDEPENDENT_AMBULATORY_CARE_PROVIDER_SITE_OTHER): Payer: Medicare Other

## 2022-11-16 ENCOUNTER — Encounter: Payer: Self-pay | Admitting: Endocrinology

## 2022-11-16 DIAGNOSIS — E1029 Type 1 diabetes mellitus with other diabetic kidney complication: Secondary | ICD-10-CM

## 2022-11-16 DIAGNOSIS — E1065 Type 1 diabetes mellitus with hyperglycemia: Secondary | ICD-10-CM | POA: Diagnosis not present

## 2022-11-16 DIAGNOSIS — R809 Proteinuria, unspecified: Secondary | ICD-10-CM

## 2022-11-16 LAB — MICROALBUMIN / CREATININE URINE RATIO
Creatinine,U: 122.7 mg/dL
Microalb Creat Ratio: 27.3 mg/g (ref 0.0–30.0)
Microalb, Ur: 33.5 mg/dL — ABNORMAL HIGH (ref 0.0–1.9)

## 2022-11-16 LAB — HEMOGLOBIN A1C: Hgb A1c MFr Bld: 7.4 % — ABNORMAL HIGH (ref 4.6–6.5)

## 2022-11-18 ENCOUNTER — Encounter: Payer: Self-pay | Admitting: Internal Medicine

## 2022-11-21 ENCOUNTER — Encounter: Payer: Self-pay | Admitting: Endocrinology

## 2022-11-21 ENCOUNTER — Ambulatory Visit (INDEPENDENT_AMBULATORY_CARE_PROVIDER_SITE_OTHER): Payer: Medicare Other | Admitting: Endocrinology

## 2022-11-21 ENCOUNTER — Telehealth: Payer: Self-pay | Admitting: Internal Medicine

## 2022-11-21 VITALS — BP 138/80 | HR 76 | Ht 63.0 in | Wt 151.4 lb

## 2022-11-21 DIAGNOSIS — R809 Proteinuria, unspecified: Secondary | ICD-10-CM | POA: Diagnosis not present

## 2022-11-21 DIAGNOSIS — I10 Essential (primary) hypertension: Secondary | ICD-10-CM | POA: Diagnosis not present

## 2022-11-21 DIAGNOSIS — E1029 Type 1 diabetes mellitus with other diabetic kidney complication: Secondary | ICD-10-CM

## 2022-11-21 DIAGNOSIS — E1065 Type 1 diabetes mellitus with hyperglycemia: Secondary | ICD-10-CM

## 2022-11-21 MED ORDER — NOVOLOG 100 UNIT/ML IJ SOLN
INTRAMUSCULAR | 1 refills | Status: DC
Start: 2022-11-21 — End: 2023-02-13

## 2022-11-21 NOTE — Patient Instructions (Signed)
Turn off sleep activity if doing heavy exercise in the pm  Bolus upto 7 units if eating  more rice  Bolus before eating meals and Carby snacks

## 2022-11-21 NOTE — Progress Notes (Signed)
Patient ID: Catherine Murphy, female   DOB: 08/17/1955, 67 y.o.   MRN: [id number]           Reason for Appointment: Type I Diabetes follow-up    History of Present Illness   Diagnosis date: 1993  Previous history:  She was started on the tandem insulin pump since 2022 replacing a regimen of Lantus and NovoLog, Lantus was not lasting 24 hours and causing overnight hypoglycemia She has always been on insulin since diagnosis  Recent history:     Basal rate of 0.4 units/hr   Fixed bolus amounts 5 to 7 units Correction factor 1: 50 Sleep mode at 12 AM-8 AM            Side effects from medications: None  Current self management, blood sugar patterns and problems identified:  A1c is 7.5 Basal amount is 30 % of her total insulin and recent total insulin about 37 units  Blood sugars are somewhat better compared to her last visit However as seen in the CGM interpretation below her postprandial readings are quite inconsistent at times She is not taking more than 5 units of bolus at any given time but appears to be needing larger doses which she is afraid to do She says that last night she had a lot of rice with sushi and does not appear to have taken a bolus before eating but only took 1 to 2 units boluses subsequently to bring the blood sugar down which were about 350 Does not think she has had any regular soft drinks or sweet tea to cause high sugars However at times her blood sugars postprandially may be flat to lower and may have mild hypoglycemia also She has seen the dietitian but does not like to do carbohydrate counting and continues to do very empirical boluses She thinks that she will sometimes get low sugars overnight but she is calling low normal readings hypoglycemia since her sensor low reading is set at 80 No low sugars with exercise which can be variable intensity She is mostly has rice or noodles with most of her meals  Overall time in range is better compared to her last  visit at 70% She was recommended Farxiga by her PCP, she is not taking this after discussion on the last visit to prevent potential ketoacidosis and given her diagnosis of type 1 diabetes  Exercise: Various aerobic exercises, sometimes less active Diet management:      CGM interpretation for the last 2 weeks through April 21 from her Dexcom G6 sensor is as follows  OVERNIGHT blood sugars are Fairly good with occasional low normal readings at different times but also some periods of hypoglycemia especially in the first half of the night but usually transient but only rarely the blood sugar may be below 70 on the CGM Blood sugars are mostly in good range during the morning hours but rare hypoglycemia around 9:30 AM Premeal blood sugars occasionally elevated before dinnertime but not as much at lunch, difficult to judge because of inconsistent mealtimes  Postprandial readings: These are usually the best controlled after breakfast and occasionally may be low after the bolus Similarly at lunch blood sugars overall not rising excessively on an average but may go above 180 on occasion, sometimes an hour later  After dinner blood sugars are frequently higher above target but no consistent pattern seen and rarely low after the bolus also Time in range is improved from the last visit and also average readings in  the evening and afternoon     CGM use % of time   2-week average/SD 159/50  Time in range 70     %  % Time Above 180 29   % Time above 250   % Time Below 70 1      PRE-MEAL  overnight  mornings  afternoon  evening Overall  Glucose range:       Averages:  143 141 166 189    Previously    CGM use % of time   2-week average/SD 173  Time in range 62  % Time Above 180 38  % Time above 250   % Time Below 70 0      PRE-MEAL  overnight  mornings  afternoon  evening Overall  Glucose range:       Averages: 163 138 189 200 173              Dietician visit: Most recent: 6/23    `  Weight control:  Wt Readings from Last 3 Encounters:  11/21/22 151 lb 6.4 oz (68.7 kg)  10/27/22 148 lb (67.1 kg)  08/29/22 149 lb 6.4 oz (67.8 kg)            Diabetes labs:  Lab Results  Component Value Date   HGBA1C 7.4 (H) 11/16/2022   HGBA1C 7.5 (H) 07/28/2022   HGBA1C 7.3 (A) 01/06/2022   Lab Results  Component Value Date   MICROALBUR 33.5 (H) 11/16/2022   LDLCALC 106 (H) 01/14/2022   CREATININE 0.56 08/18/2022     Allergies as of 11/21/2022   No Known Allergies      Medication List        Accurate as of November 21, 2022  9:01 PM. If you have any questions, ask your nurse or doctor.          STOP taking these medications    Arnuity Ellipta 200 MCG/ACT Aepb Generic drug: Fluticasone Furoate Stopped by: Reather Littler, MD   azelastine 0.1 % nasal spray Commonly known as: ASTELIN Stopped by: Reather Littler, MD   dapagliflozin propanediol 10 MG Tabs tablet Commonly known as: [place] Stopped by: Reather Littler, MD       TAKE these medications    albuterol 108 (90 Base) MCG/ACT inhaler Commonly known as: VENTOLIN HFA Inhale 2 puffs into the lungs every 6 (six) hours as needed for wheezing or shortness of breath.   Anoro Ellipta 62.5-25 MCG/ACT Aepb Generic drug: umeclidinium-vilanterol INHALE 1 INHALATION BY MOUTH  DAILY AT 6 AM   B-D UF III MINI PEN NEEDLES 31G X 5 MM Misc Generic drug: Insulin Pen Needle by Does not apply route.   Dexcom G6 Sensor Misc Change every 10 days   Dexcom G6 Transmitter Misc Change every 3 months   Dexcom G7 Receiver Devi 1 Act by Does not apply route daily.   hydrOXYzine 10 MG tablet Commonly known as: ATARAX TAKE 1 TABLET BY MOUTH THREE TIMES A DAY AS NEEDED   indapamide 1.25 MG tablet Commonly known as: LOZOL Take 1 tablet (1.25 mg total) by mouth daily.   ipratropium 0.03 % nasal spray Commonly known as: ATROVENT PLACE 2 SPRAYS INTO BOTH NOSTRILS EVERY 12 (TWELVE) HOURS.   losartan 100 MG  tablet Commonly known as: COZAAR Take 1 tablet (100 mg total) by mouth daily.   metFORMIN 500 MG 24 hr tablet Commonly known as: GLUCOPHAGE-XR Take 2 tablets (1,000 mg total) by mouth daily with breakfast.   montelukast 10 MG  tablet Commonly known as: SINGULAIR Take 1 tablet (10 mg total) by mouth at bedtime.   NovoLOG 100 UNIT/ML injection Generic drug: insulin aspart For use in pump, total of 60 units per day   OneTouch Delica Lancets 33G Misc Use to check blood sugar twice a day. E11.9   OneTouch Verio test strip Generic drug: glucose blood USE 1 STRIP IN THE MORNING, AT NOON, AND AT BEDTIME AS DIRECTED   OneTouch Verio w/Device Kit Inject 1 Act into the skin 2 (two) times daily as needed. Use to monitor blood sugar twice a day. E11.9   pioglitazone 15 MG tablet Commonly known as: ACTOS Take 15 mg by mouth daily.   simvastatin 10 MG tablet Commonly known as: ZOCOR TAKE 1 TABLET BY MOUTH AT  BEDTIME        Allergies: No Known Allergies  Past Medical History:  Diagnosis Date   Diabetes mellitus without complication     Past Surgical History:  Procedure Laterality Date   CATARACT EXTRACTION Right    COLONOSCOPY     TRIGGER FINGER RELEASE      Family History  Problem Relation Age of Onset   Ovarian cancer Mother    Glaucoma Father    Colon cancer Father    Diabetes Neg Hx     Social History:  reports that she has never smoked. She has never used smokeless tobacco. She reports that she does not currently use alcohol. She reports that she does not use drugs.  Review of Systems:  Last diabetic eye exam date is 10/12/2021 with retinopathy  Last foot exam date: Up-to-date  Urine microalbumin ratio 66 as of 07/29/2022 and now back to normal at 27, this was explained to the patient in detail and benefiting from higher dose of losartan also  Hypertension: Treated with losartan, now taking 100 mg  Bp recently at home about 132/82-86 Appears to be higher  today  BP Readings from Last 3 Encounters:  11/21/22 138/80  10/27/22 136/78  08/29/22 138/74    Lipids: Treated with simvastatin 10 mg daily by her PCP    Lab Results  Component Value Date   CHOL 207 (H) 01/14/2022   CHOL 185 03/24/2021   Lab Results  Component Value Date   HDL 86.70 01/14/2022   HDL 75.90 03/24/2021   Lab Results  Component Value Date   LDLCALC 106 (H) 01/14/2022   LDLCALC 98 03/24/2021   Lab Results  Component Value Date   TRIG 69.0 01/14/2022   TRIG 60.0 03/24/2021   Lab Results  Component Value Date   CHOLHDL 2 01/14/2022   CHOLHDL 2 03/24/2021   No results found for: "LDLDIRECT"   Examination:   BP 138/80 (BP Location: Left Arm, Patient Position: Sitting, Cuff Size: Normal)   Pulse 76   Ht  (1.6 m)   Wt 151 lb 6.4 oz (68.7 kg)   SpO2 98%   BMI 26.82 kg/m   Body mass index is 26.82 kg/m.    ASSESSMENT/ PLAN:    Diabetes type 1:   Current regimen: T-slim insulin pump with fixed boluses  See history of present illness for detailed discussion of current diabetes management, blood sugar patterns and problems identified  Her A1c is still relatively high at 7.4  Blood glucose control is slightly better last time with 70% within target range Again discussed episodes of her low and high sugars and causes of this Has difficulty controlling postprandial readings as before  Because of her  relatively high carbohydrate diet including rice and noodles she is mostly needing more than 5 units of insulin to cover most of her meals which she does not generally take Also possibly from some higher fat meals she may have a delayed rise in blood sugars at times Currently no symptoms suggestive of gastroparesis  Recommendations:  Turn off sleep activity on the night if doing heavy exercise in the late afternoon No need to use activity mode of exercise while going to the gym Continue sleep mode on the other days Bolus more consistently before  eating meals and high carbohydrate snacks She needs to take correction boluses 1 to 2 hours after meals if blood sugars are rising more than expected Also take correction boluses as calculated by the pump when needed Discussed that she may look at the Dexcom 7 sensor but needs to take the online tutorial first Previously had declined to change her pump to Medtr[employer] which would likely give a better algorithm Likely needs at least 2 to 3 units more for any higher carbohydrate or high fat meals, may take insulin right after finishing eating if not taking for patient does before the meal Bolus upto 7 units if eating  more rice    HYPERTENSION: Fairly good control To be followed by PCP Also needs to have follow-up with lipids with PCP, likely needs higher than 10 mg simvastatin dosage   Patient Instructions  Turn off sleep activity if doing heavy exercise in the pm  Bolus upto 7 units if eating  more rice  Bolus before eating meals and Carby snacks  Total visit time for evaluation and management of multiple problems including counseling = 40 minutes  Reather Littler 11/21/2022, 9:01 PM

## 2022-11-21 NOTE — Telephone Encounter (Signed)
Prescription Request  11/21/2022  LOV: 10/27/2022  What is the name of the medication or equipment? Insulin  Have you contacted your pharmacy to request a refill? No   Which pharmacy would you like this sent to?   [facility] - [place], [place] - [address] [place] [address] Phone: [phone] Fax: [phone]     Patient notified that their request is being sent to the clinical staff for review and that they should receive a response within 2 business days.   Please advise at Mobile [phone] (mobile)

## 2022-11-23 ENCOUNTER — Encounter: Payer: Self-pay | Admitting: Endocrinology

## 2022-12-08 IMAGING — MG MM DIGITAL SCREENING BILAT W/ TOMO AND CAD
8 series · 8 of 24 positions shown · non-contrast
Comparison: Previous exam(s).

CLINICAL DATA: Screening.

EXAM:
DIGITAL SCREENING BILATERAL MAMMOGRAM WITH TOMOSYNTHESIS AND CAD
TECHNIQUE: Bilateral screening digital craniocaudal and mediolateral oblique
mammograms were obtained. Bilateral screening digital breast
tomosynthesis was performed. The images were evaluated with
computer-aided detection.

[R MLO synth-2D]
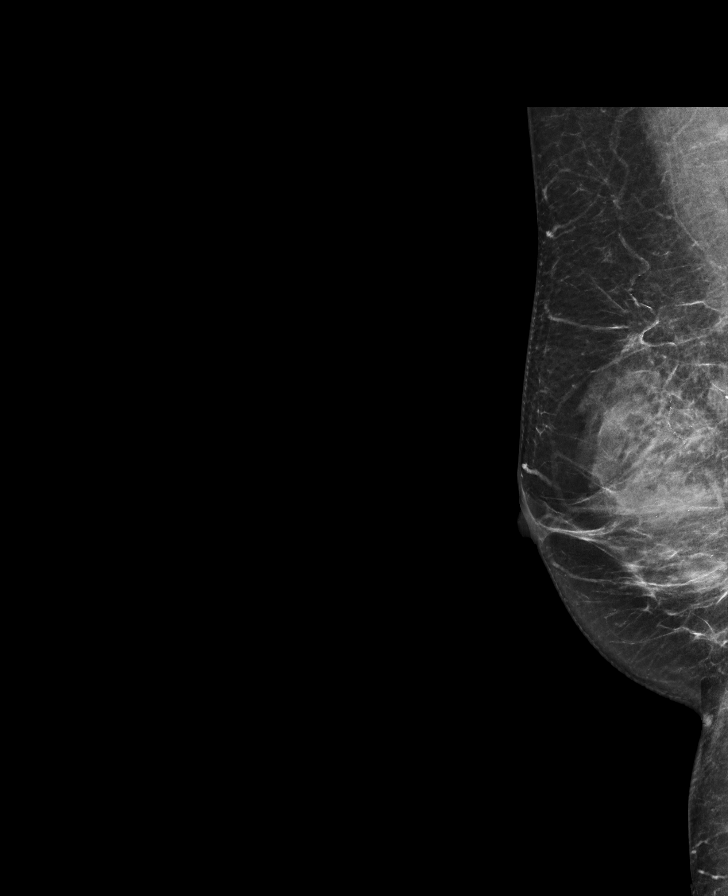

[L CC synth-2D]
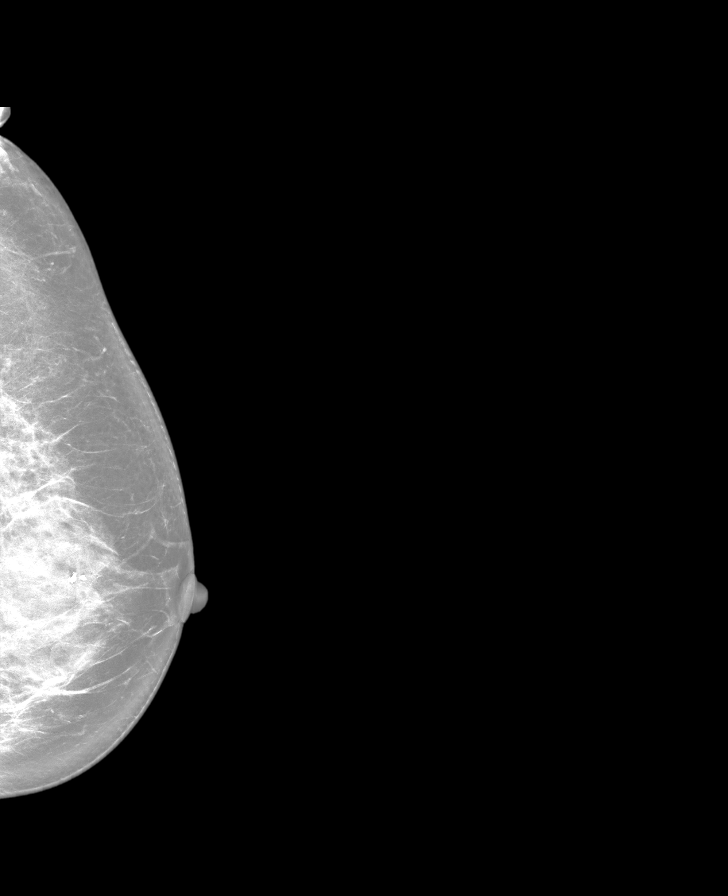

[L MLO synth-2D]
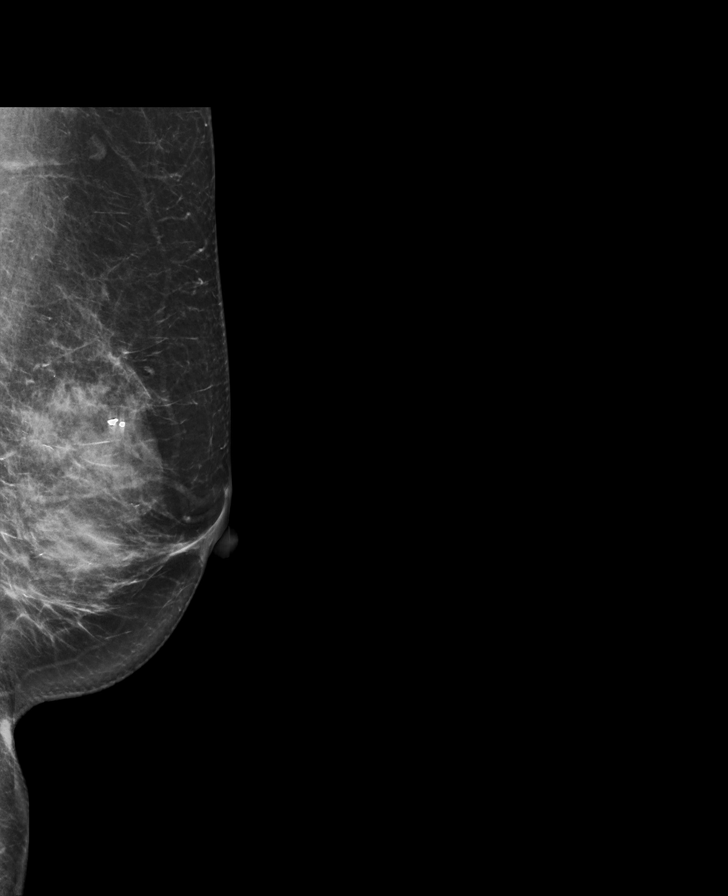

[R CC synth-2D]
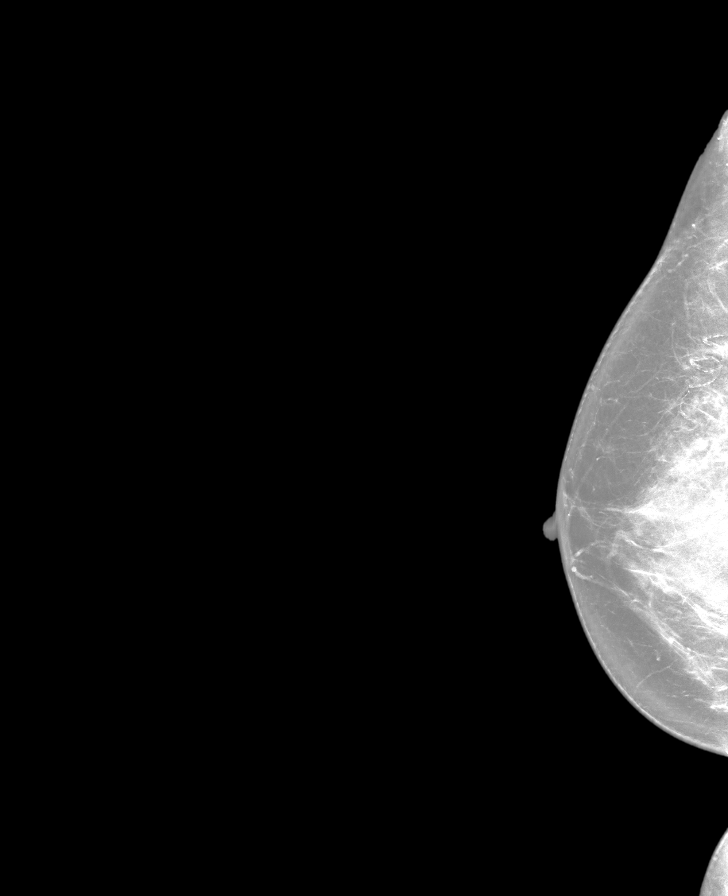

[L CC tomo · tomo slice 40/79.0]
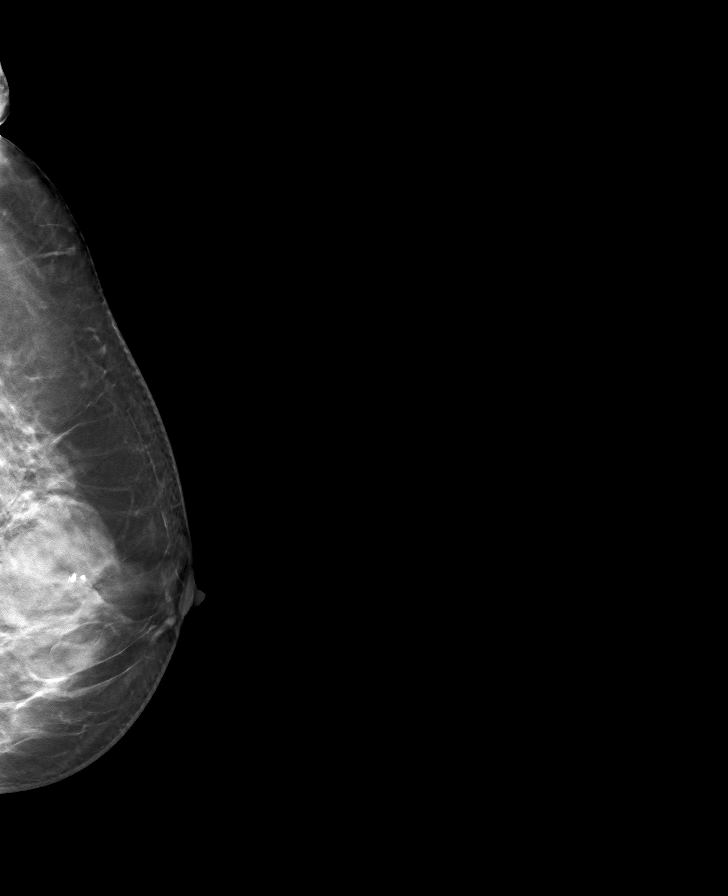

[R CC tomo · tomo slice 39/76.0]
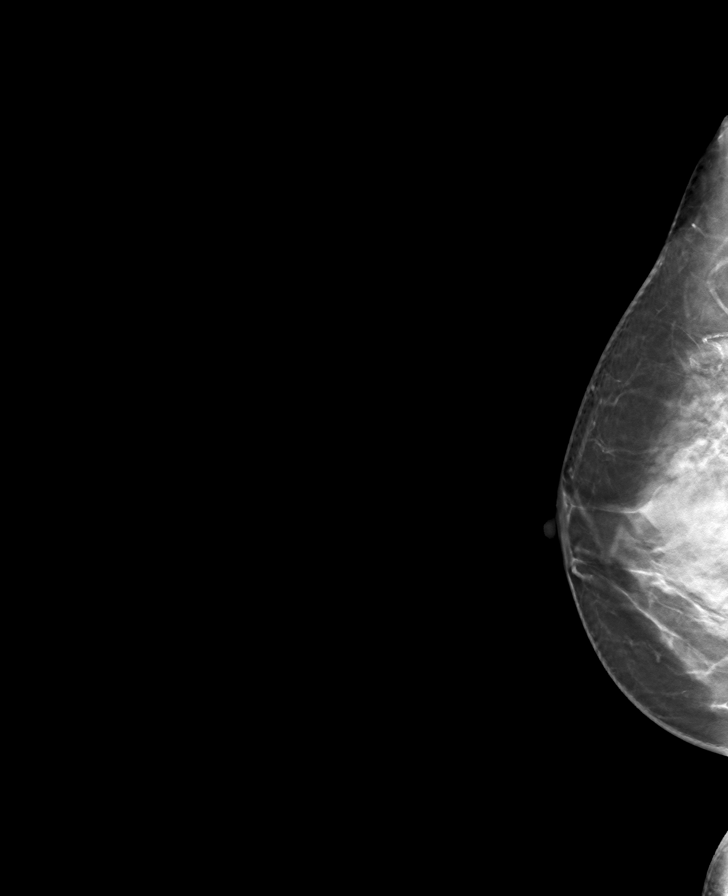

[L MLO tomo · tomo slice 39/78.0]
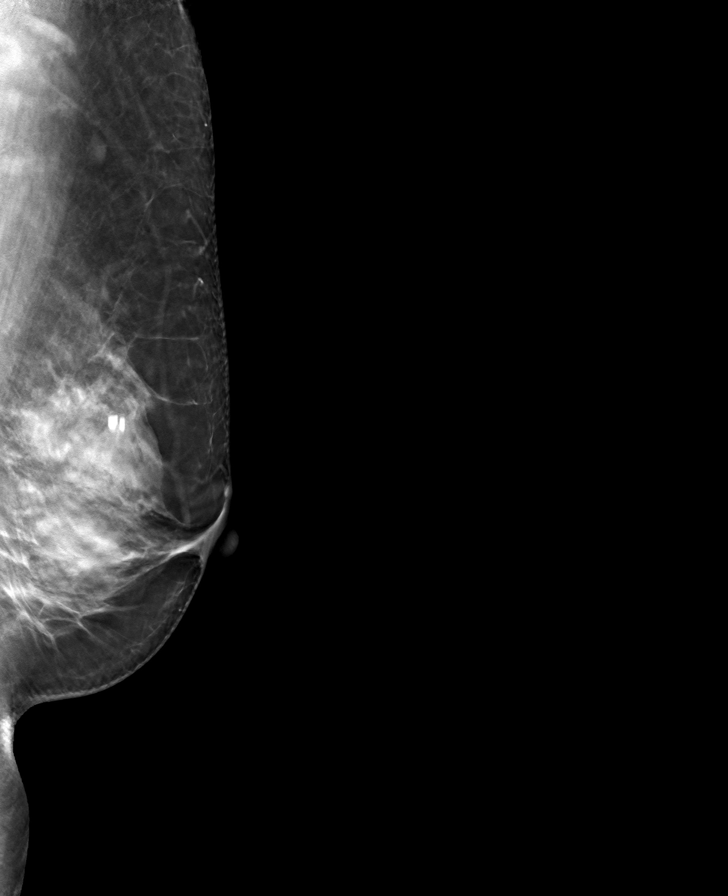

[R MLO tomo · tomo slice 37/72.0]
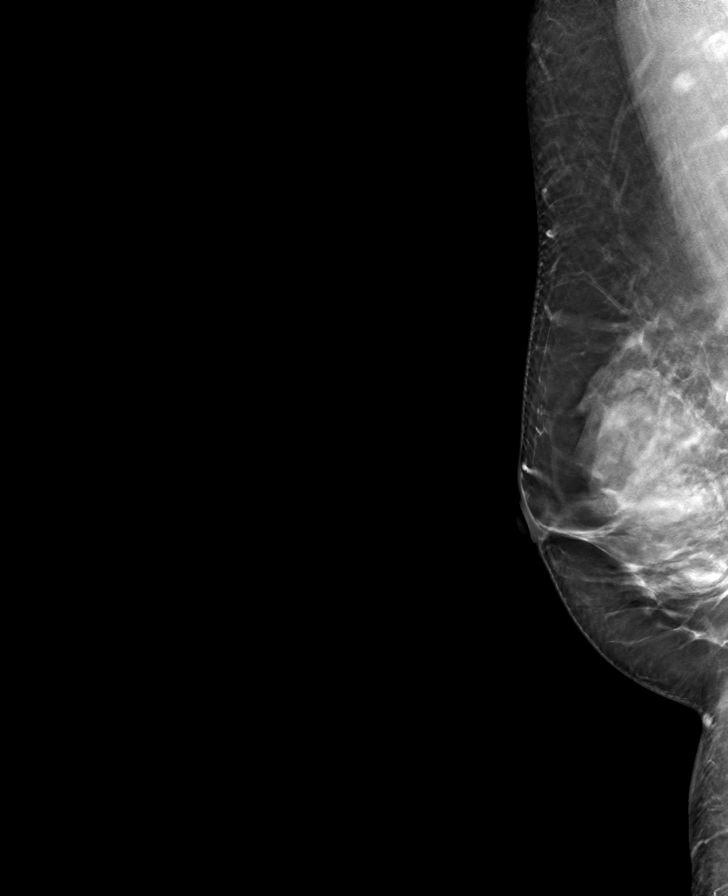

[8 of 24 positions shown; findings below may reference images not displayed]

ACR Breast Density Category d: The breast tissue is extremely dense,
which lowers the sensitivity of mammography
FINDINGS: There are no findings suspicious for malignancy.
IMPRESSION: No mammographic evidence of malignancy. A result letter of this
screening mammogram will be mailed directly to the patient.

RECOMMENDATION:
Screening mammogram in one year. (Code:[id number])

BI-RADS CATEGORY  1: Negative.

## 2022-12-20 ENCOUNTER — Telehealth: Payer: Self-pay | Admitting: Endocrinology

## 2022-12-20 NOTE — Telephone Encounter (Signed)
Patient is requesting a referral to Dr Ocie Cornfield at [place].

## 2022-12-23 ENCOUNTER — Other Ambulatory Visit: Payer: Self-pay | Admitting: Internal Medicine

## 2022-12-23 DIAGNOSIS — E119 Type 2 diabetes mellitus without complications: Secondary | ICD-10-CM

## 2022-12-27 ENCOUNTER — Telehealth: Payer: Self-pay | Admitting: Internal Medicine

## 2022-12-27 NOTE — Telephone Encounter (Signed)
Referral needs to be made to [facility]  - Catherine Murphy -   Please cancel the previous appointment.

## 2022-12-28 ENCOUNTER — Telehealth: Payer: Self-pay | Admitting: Internal Medicine

## 2022-12-28 DIAGNOSIS — R0982 Postnasal drip: Secondary | ICD-10-CM

## 2022-12-28 DIAGNOSIS — J4530 Mild persistent asthma, uncomplicated: Secondary | ICD-10-CM

## 2022-12-28 MED ORDER — ANORO ELLIPTA 62.5-25 MCG/ACT IN AEPB
INHALATION_SPRAY | RESPIRATORY_TRACT | 0 refills | Status: DC
Start: 2022-12-28 — End: 2023-06-06

## 2022-12-28 MED ORDER — IPRATROPIUM BROMIDE 0.03 % NA SOLN
2.0000 | Freq: Two times a day (BID) | NASAL | 4 refills | Status: DC
Start: 2022-12-28 — End: 2023-05-10

## 2022-12-28 NOTE — Telephone Encounter (Signed)
Prescription Request  12/28/2022  LOV: 10/27/2022  What is the name of the medication or equipment?  umeclidinium-vilanterol (ANORO ELLIPTA) 62.5-25 MCG/ACT AEPB  ipratropium (ATROVENT) 0.03 % nasal spray  Have you contacted your pharmacy to request a refill? No   Which pharmacy would you like this sent to?   [facility] - [place], [place] - [address] [place] [address] Phone: [phone] Fax: [phone]   Patient notified that their request is being sent to the clinical staff for review and that they should receive a response within 2 business days.   Please advise at Mobile [phone] (mobile)

## 2022-12-29 ENCOUNTER — Other Ambulatory Visit: Payer: Self-pay | Admitting: Internal Medicine

## 2023-01-10 ENCOUNTER — Other Ambulatory Visit: Payer: Self-pay | Admitting: Internal Medicine

## 2023-01-10 DIAGNOSIS — E119 Type 2 diabetes mellitus without complications: Secondary | ICD-10-CM

## 2023-01-10 DIAGNOSIS — E118 Type 2 diabetes mellitus with unspecified complications: Secondary | ICD-10-CM

## 2023-01-21 ENCOUNTER — Other Ambulatory Visit: Payer: Self-pay | Admitting: Internal Medicine

## 2023-01-21 DIAGNOSIS — E1129 Type 2 diabetes mellitus with other diabetic kidney complication: Secondary | ICD-10-CM

## 2023-01-21 DIAGNOSIS — I10 Essential (primary) hypertension: Secondary | ICD-10-CM

## 2023-01-26 ENCOUNTER — Other Ambulatory Visit: Payer: Self-pay | Admitting: Internal Medicine

## 2023-01-26 DIAGNOSIS — E119 Type 2 diabetes mellitus without complications: Secondary | ICD-10-CM

## 2023-01-26 DIAGNOSIS — E118 Type 2 diabetes mellitus with unspecified complications: Secondary | ICD-10-CM

## 2023-02-13 ENCOUNTER — Other Ambulatory Visit: Payer: Self-pay | Admitting: Internal Medicine

## 2023-02-13 DIAGNOSIS — E1065 Type 1 diabetes mellitus with hyperglycemia: Secondary | ICD-10-CM

## 2023-02-27 ENCOUNTER — Ambulatory Visit (INDEPENDENT_AMBULATORY_CARE_PROVIDER_SITE_OTHER): Payer: Medicare Other | Admitting: Internal Medicine

## 2023-02-27 ENCOUNTER — Encounter: Payer: Self-pay | Admitting: Internal Medicine

## 2023-02-27 VITALS — BP 136/80 | HR 85 | Temp 98.4°F | Resp 16 | Ht 63.0 in | Wt 147.3 lb

## 2023-02-27 DIAGNOSIS — Z23 Encounter for immunization: Secondary | ICD-10-CM | POA: Insufficient documentation

## 2023-02-27 DIAGNOSIS — I152 Hypertension secondary to endocrine disorders: Secondary | ICD-10-CM | POA: Diagnosis not present

## 2023-02-27 DIAGNOSIS — E1159 Type 2 diabetes mellitus with other circulatory complications: Secondary | ICD-10-CM | POA: Diagnosis not present

## 2023-02-27 DIAGNOSIS — E119 Type 2 diabetes mellitus without complications: Secondary | ICD-10-CM

## 2023-02-27 DIAGNOSIS — N182 Chronic kidney disease, stage 2 (mild): Secondary | ICD-10-CM

## 2023-02-27 DIAGNOSIS — Z794 Long term (current) use of insulin: Secondary | ICD-10-CM

## 2023-02-27 DIAGNOSIS — E785 Hyperlipidemia, unspecified: Secondary | ICD-10-CM

## 2023-02-27 LAB — HEMOGLOBIN A1C: Hgb A1c MFr Bld: 7.3 % — ABNORMAL HIGH (ref 4.6–6.5)

## 2023-02-27 LAB — LIPID PANEL
Cholesterol: 203 mg/dL — ABNORMAL HIGH (ref 0–200)
HDL: 86.5 mg/dL (ref >39.00)
LDL Cholesterol: 107 mg/dL — ABNORMAL HIGH (ref 0–99)
NonHDL: 116.57
Total CHOL/HDL Ratio: 2
Triglycerides: 49 mg/dL (ref 0.0–149.0)
VLDL: 9.8 mg/dL (ref 0.0–40.0)

## 2023-02-27 LAB — HEPATIC FUNCTION PANEL
ALT: 14 U/L (ref 0–35)
AST: 22 U/L (ref 0–37)
Albumin: 4 g/dL (ref 3.5–5.2)
Alkaline Phosphatase: 46 U/L (ref 39–117)
Bilirubin, Direct: 0.1 mg/dL (ref 0.0–0.3)
Total Bilirubin: 0.5 mg/dL (ref 0.2–1.2)
Total Protein: 7.2 g/dL (ref 6.0–8.3)

## 2023-02-27 LAB — BASIC METABOLIC PANEL
BUN: 19 mg/dL (ref 6–23)
CO2: 28 mEq/L (ref 19–32)
Calcium: 9.3 mg/dL (ref 8.4–10.5)
Chloride: 103 mEq/L (ref 96–112)
Creatinine, Ser: 0.67 mg/dL (ref 0.40–1.20)
GFR: 91.58 mL/min (ref >60.00)
Glucose, Bld: 134 mg/dL — ABNORMAL HIGH (ref 70–99)
Potassium: 3.9 mEq/L (ref 3.5–5.1)
Sodium: 139 mEq/L (ref 135–145)

## 2023-02-27 LAB — CBC WITH DIFFERENTIAL/PLATELET
Basophils Absolute: 0 10*3/uL (ref 0.0–0.1)
Basophils Relative: 1 % (ref 0.0–3.0)
Eosinophils Absolute: 0.2 10*3/uL (ref 0.0–0.7)
Eosinophils Relative: 3.4 % (ref 0.0–5.0)
HCT: 40 % (ref 36.0–46.0)
Hemoglobin: 12.9 g/dL (ref 12.0–15.0)
Lymphocytes Relative: 34 % (ref 12.0–46.0)
Lymphs Abs: 1.6 10*3/uL (ref 0.7–4.0)
MCHC: 32.3 g/dL (ref 30.0–36.0)
MCV: 92.1 fl (ref 78.0–100.0)
Monocytes Absolute: 0.3 10*3/uL (ref 0.1–1.0)
Monocytes Relative: 6 % (ref 3.0–12.0)
Neutro Abs: 2.7 10*3/uL (ref 1.4–7.7)
Neutrophils Relative %: 55.6 % (ref 43.0–77.0)
Platelets: 258 10*3/uL (ref 150.0–400.0)
RBC: 4.34 Mil/uL (ref 3.87–5.11)
RDW: 13.2 % (ref 11.5–15.5)
WBC: 4.8 10*3/uL (ref 4.0–10.5)

## 2023-02-27 MED ORDER — BOOSTRIX 5-2.5-18.5 LF-MCG/0.5 IM SUSP: 0.5000 mL | Freq: Once | INTRAMUSCULAR | 0 refills | Status: AC

## 2023-02-27 NOTE — Progress Notes (Addendum)
Subjective:  Patient ID: Catherine Murphy, female    DOB: 08/30/1955  Age: 67 y.o. MRN: [id number]  CC: Asthma, Hypertension, Hyperlipidemia, and Diabetes   HPI Catherine Murphy presents for f/up ----  Discussed the use of AI scribe software for clinical note transcription with the patient, who gave verbal consent to proceed.  History of Present Illness   The patient, with a history of chronic cough and insomnia, presents with a recent fall due to a misstep. The fall resulted in significant bruising on the face and a temporary visual disturbance, described as everything appearing 'very heavy.' The patient also reported feeling as though their head was 'going wrong.' However, they denied any associated headache, nausea, vomiting, or pain. The bruising and visual disturbance resolved spontaneously over three weeks.  The patient remains active and denies any chest pain, shortness of breath, or dizziness during physical activity. They have been using an inhaler for their chronic cough, which they describe as sometimes dry. They also use a nasal spray daily for sinus issues, which they report as persistent despite treatment.  Regarding their insomnia, the patient reports difficulty sleeping at night, often waking up to use the bathroom. They have been taking melatonin to aid sleep, as their previous anxiety medication no longer seems effective. They deny excessive thirst or food intake. The patient's medications, apart from the inhaler and nasal spray, were not discussed in detail.      Blood sugars vary - down to 60 and up to 150.  Outpatient Medications Prior to Visit  Medication Sig Dispense Refill   albuterol (VENTOLIN HFA) 108 (90 Base) MCG/ACT inhaler Inhale 2 puffs into the lungs every 6 (six) hours as needed for wheezing or shortness of breath. 18 g 5   Blood Glucose Monitoring Suppl (ONETOUCH VERIO) w/Device KIT Inject 1 Act into the skin 2 (two) times daily as needed. Use to monitor blood  sugar twice a day. E11.9 1 kit 1   Continuous Blood Gluc Receiver (DEXCOM G7 RECEIVER) DEVI 1 Act by Does not apply route daily. 9 each 1   Continuous Blood Gluc Sensor (DEXCOM G6 SENSOR) MISC Change every 10 days 9 each 2   Continuous Blood Gluc Transmit (DEXCOM G6 TRANSMITTER) MISC Change every 3 months 1 each 2   hydrOXYzine (ATARAX) 10 MG tablet TAKE 1 TABLET BY MOUTH THREE TIMES A DAY AS NEEDED 270 tablet 0   indapamide (LOZOL) 1.25 MG tablet Take 1 tablet (1.25 mg total) by mouth daily. 90 tablet 0   Insulin Pen Needle (B-D UF III MINI PEN NEEDLES) 31G X 5 MM MISC by Does not apply route.     ipratropium (ATROVENT) 0.03 % nasal spray Place 2 sprays into both nostrils every 12 (twelve) hours. 30 mL 4   Lancets (ONETOUCH DELICA PLUS LANCET33G) MISC USE TO CHECK BLOOD SUGAR TWICE A DAY. E11.9 100 each 5   losartan (COZAAR) 100 MG tablet TAKE 1 TABLET BY MOUTH DAILY 90 tablet 0   metFORMIN (GLUCOPHAGE-XR) 500 MG 24 hr tablet TAKE 2 TABLETS BY MOUTH DAILY  WITH BREAKFAST 180 tablet 0   montelukast (SINGULAIR) 10 MG tablet Take 1 tablet (10 mg total) by mouth at bedtime. 90 tablet 1   NOVOLOG 100 UNIT/ML injection FOR USE IN PUMP, TOTAL OF 60  UNITS PER DAY 60 mL 0   ONETOUCH VERIO test strip USE 1 STRIP IN THE MORNING, AT NOON, AND AT BEDTIME AS DIRECTED 300 strip 1   simvastatin (ZOCOR) 10  MG tablet TAKE 1 TABLET BY MOUTH AT  BEDTIME 90 tablet 1   umeclidinium-vilanterol (ANORO ELLIPTA) 62.5-25 MCG/ACT AEPB INHALE 1 INHALATION BY MOUTH  DAILY AT 6 AM 180 each 0   pioglitazone (ACTOS) 15 MG tablet Take 15 mg by mouth daily.     No facility-administered medications prior to visit.    ROS Review of Systems  Constitutional: Negative.  Negative for diaphoresis and fatigue.  HENT: Negative.    Eyes: Negative.   Respiratory:  Positive for cough and wheezing. Negative for choking, chest tightness, shortness of breath and stridor.   Cardiovascular:  Negative for chest pain, palpitations and leg  swelling.  Gastrointestinal: Negative.  Negative for abdominal pain.  Endocrine: Negative.   Genitourinary: Negative.  Negative for difficulty urinating and dysuria.  Musculoskeletal: Negative.  Negative for arthralgias and myalgias.  Skin: Negative.   Neurological: Negative.  Negative for dizziness and weakness.  Hematological:  Negative for adenopathy. Does not bruise/bleed easily.  Psychiatric/Behavioral: Negative.      Objective:  BP 136/80 (BP Location: Right Arm, Patient Position: Sitting, Cuff Size: Large)   Pulse 85   Temp 98.4 F (36.9 C) (Oral)   Resp 16   Ht 5\' 3"  (1.6 m)   Wt 147 lb 4.8 oz (66.8 kg)   SpO2 94%   BMI 26.09 kg/m   BP Readings from Last 3 Encounters:  02/27/23 136/80  11/21/22 138/80  10/27/22 136/78    Wt Readings from Last 3 Encounters:  02/27/23 147 lb 4.8 oz (66.8 kg)  11/21/22 151 lb 6.4 oz (68.7 kg)  10/27/22 148 lb (67.1 kg)    Physical Exam Vitals reviewed.  Constitutional:      Appearance: Normal appearance. She is not ill-appearing.  HENT:     Mouth/Throat:     Mouth: Mucous membranes are moist.  Eyes:     General: No scleral icterus.    Conjunctiva/sclera: Conjunctivae normal.  Cardiovascular:     Rate and Rhythm: Normal rate and regular rhythm.     Heart sounds: No murmur heard. Pulmonary:     Effort: Pulmonary effort is normal.     Breath sounds: No stridor. No wheezing, rhonchi or rales.  Abdominal:     General: Abdomen is flat.     Palpations: There is no mass.     Tenderness: There is no abdominal tenderness. There is no guarding.     Hernia: No hernia is present.  Musculoskeletal:        General: Normal range of motion.     Cervical back: Neck supple.     Right lower leg: No edema.     Left lower leg: No edema.  Lymphadenopathy:     Cervical: No cervical adenopathy.  Skin:    General: Skin is warm and dry.  Neurological:     General: No focal deficit present.     Mental Status: She is alert. Mental status is  at baseline.  Psychiatric:        Mood and Affect: Mood normal.        Behavior: Behavior normal.     Lab Results  Component Value Date   WBC 4.8 02/27/2023   HGB 12.9 02/27/2023   HCT 40.0 02/27/2023   PLT 258.0 02/27/2023   GLUCOSE 134 (H) 02/27/2023   CHOL 203 (H) 02/27/2023   TRIG 49.0 02/27/2023   HDL 86.50 02/27/2023   LDLCALC 107 (H) 02/27/2023   ALT 14 02/27/2023   AST 22 02/27/2023  NA 139 02/27/2023   K 3.9 02/27/2023   CL 103 02/27/2023   CREATININE 0.67 02/27/2023   BUN 19 02/27/2023   CO2 28 02/27/2023   TSH 3.57 07/28/2022   HGBA1C 7.3 (H) 02/27/2023   MICROALBUR 33.5 (H) 11/16/2022    MM Digital Diagnostic Unilat R  Result Date: 06/14/2022 CLINICAL DATA:  Patient returns today to evaluate RIGHT breast calcifications identified on recent screening mammogram. EXAM: DIGITAL DIAGNOSTIC UNILATERAL RIGHT MAMMOGRAM TECHNIQUE: Right digital diagnostic mammography was performed. COMPARISON:  Previous exams including recent screening mammogram dated 06/01/2022 and earlier screening mammogram dated 02/11/2020. ACR Breast Density Category c: The breast tissue is heterogeneously dense, which may obscure small masses. FINDINGS: On today's additional diagnostic views, including magnification views, the coarse and round calcifications within the lower RIGHT breast are not significantly changed in extent compared to the earlier screening mammogram of 02/11/2020 confirming benignity. No suspicious pleomorphic or fine linear branching calcifications are identified. IMPRESSION: No evidence of malignancy. Benign calcifications within the lower RIGHT breast. Patient may return to routine annual bilateral screening mammogram schedule. RECOMMENDATION: Screening mammogram in one year.(Code:[id number]-[id number]) I have discussed the findings and recommendations with the patient. If applicable, a reminder letter will be sent to the patient regarding the next appointment. BI-RADS CATEGORY  2: Benign.  Electronically Signed   By: Bary Richard M.D.   On: 06/14/2022 11:56    Assessment & Plan:   Chronic renal disease, stage 2, mildly decreased glomerular filtration rate (GFR) between 60-89 mL/min/1.73 square meter -     Basic metabolic panel; Future  Hyperlipidemia with target LDL less than 100- Will evaluate for CAD with a CCS. -     Lipoprotein A (LPA); Future -     Lipid panel; Future -     Hepatic function panel; Future -     CT CARDIAC SCORING (SELF PAY ONLY); Future  Hypertension associated with diabetes (HCC)- Her blood pressure is well-controlled. -     Hemoglobin A1c; Future -     CBC with Differential/Platelet; Future -     Hepatic function panel; Future -     CT CARDIAC SCORING (SELF PAY ONLY); Future  Insulin-requiring or dependent type II diabetes mellitus (HCC)- Her blood sugar is well-controlled. -     Hemoglobin A1c; Future  Need for prophylactic vaccination with combined diphtheria-tetanus-pertussis (DTP) vaccine -     Boostrix; Inject 0.5 mLs into the muscle once for 1 dose.  Dispense: 0.5 mL; Refill: 0     Follow-up: Return in about 6 months (around 08/30/2023).  Sanda Linger, MD

## 2023-02-27 NOTE — Patient Instructions (Signed)

## 2023-03-23 ENCOUNTER — Ambulatory Visit: Payer: Medicare Other | Admitting: "Endocrinology

## 2023-04-19 ENCOUNTER — Telehealth: Payer: Self-pay | Admitting: Internal Medicine

## 2023-04-19 NOTE — Telephone Encounter (Signed)
Prescription Request  04/19/2023  LOV: 02/27/2023  What is the name of the medication or equipment?  hydrOXYzine (ATARAX) 10 MG tablet [   Have you contacted your pharmacy to request a refill? No   Which pharmacy would you like this sent to?  [id number] - [place], [place] - [address] AT [address] OF [facility] [address] [place] [address] Phone: [phone] Fax: [phone]    Patient notified that their request is being sent to the clinical staff for review and that they should receive a response within 2 business days.   Please advise at Mobile [phone] (mobile)

## 2023-04-21 ENCOUNTER — Other Ambulatory Visit: Payer: Self-pay | Admitting: Internal Medicine

## 2023-04-21 ENCOUNTER — Ambulatory Visit (INDEPENDENT_AMBULATORY_CARE_PROVIDER_SITE_OTHER): Payer: Medicare Other

## 2023-04-21 VITALS — BP 126/62 | HR 57 | Ht 63.0 in | Wt 147.0 lb

## 2023-04-21 DIAGNOSIS — E118 Type 2 diabetes mellitus with unspecified complications: Secondary | ICD-10-CM

## 2023-04-21 DIAGNOSIS — Z0000 Encounter for general adult medical examination without abnormal findings: Secondary | ICD-10-CM

## 2023-04-21 DIAGNOSIS — E785 Hyperlipidemia, unspecified: Secondary | ICD-10-CM

## 2023-04-21 DIAGNOSIS — E119 Type 2 diabetes mellitus without complications: Secondary | ICD-10-CM

## 2023-04-21 NOTE — Progress Notes (Signed)
Subjective:   Catherine Murphy is a 67 y.o. female who presents for Medicare Annual (Subsequent) preventive examination.  Visit Complete: Virtual  I connected with  Elvia Collum on 04/21/23 by a audio enabled telemedicine application and verified that I am speaking with the correct person using two identifiers.  Patient Location: Home  Provider Location: Office/Clinic  I discussed the limitations of evaluation and management by telemedicine. The patient expressed understanding and agreed to proceed.  Vital Signs: Because this visit was a virtual/telehealth visit, some criteria may be missing or patient reported. Any vitals not documented were not able to be obtained and vitals that have been documented are patient reported.    Cardiac Risk Factors include: advanced age (>65men, >72 women);hypertension;diabetes mellitus;Other (see comment);dyslipidemia, Risk factor comments: Chronic renal disease stage 2     Objective:    Today's Vitals   04/21/23 1349  BP: 126/62  Pulse: (!) 57  Weight: 147 lb (66.7 kg)  Height: 5\' 3"  (1.6 m)   Body mass index is 26.04 kg/m.     04/21/2023    1:56 PM 08/16/2022    3:46 PM  Advanced Directives  Does Patient Have a Medical Advance Directive? No No  Would patient like information on creating a medical advance directive?  Yes (MAU/Ambulatory/Procedural Areas - Information given)    Current Medications (verified) Outpatient Encounter Medications as of 04/21/2023  Medication Sig   albuterol (VENTOLIN HFA) 108 (90 Base) MCG/ACT inhaler Inhale 2 puffs into the lungs every 6 (six) hours as needed for wheezing or shortness of breath.   Blood Glucose Monitoring Suppl (ONETOUCH VERIO) w/Device KIT Inject 1 Act into the skin 2 (two) times daily as needed. Use to monitor blood sugar twice a day. E11.9   Continuous Blood Gluc Receiver (DEXCOM G7 RECEIVER) DEVI 1 Act by Does not apply route daily.   Continuous Blood Gluc Sensor (DEXCOM G6 SENSOR) MISC  Change every 10 days   Continuous Blood Gluc Transmit (DEXCOM G6 TRANSMITTER) MISC Change every 3 months   hydrOXYzine (ATARAX) 10 MG tablet TAKE 1 TABLET BY MOUTH THREE TIMES A DAY AS NEEDED   indapamide (LOZOL) 1.25 MG tablet Take 1 tablet (1.25 mg total) by mouth daily.   Insulin Pen Needle (B-D UF III MINI PEN NEEDLES) 31G X 5 MM MISC by Does not apply route.   ipratropium (ATROVENT) 0.03 % nasal spray Place 2 sprays into both nostrils every 12 (twelve) hours.   Lancets (ONETOUCH DELICA PLUS LANCET33G) MISC USE TO CHECK BLOOD SUGAR TWICE A DAY. E11.9   losartan (COZAAR) 100 MG tablet TAKE 1 TABLET BY MOUTH DAILY   metFORMIN (GLUCOPHAGE-XR) 500 MG 24 hr tablet TAKE 2 TABLETS BY MOUTH DAILY  WITH BREAKFAST   montelukast (SINGULAIR) 10 MG tablet Take 1 tablet (10 mg total) by mouth at bedtime.   NOVOLOG 100 UNIT/ML injection FOR USE IN PUMP, TOTAL OF 60  UNITS PER DAY   ONETOUCH VERIO test strip USE 1 STRIP IN THE MORNING, AT NOON, AND AT BEDTIME AS DIRECTED   simvastatin (ZOCOR) 10 MG tablet TAKE 1 TABLET BY MOUTH AT  BEDTIME   umeclidinium-vilanterol (ANORO ELLIPTA) 62.5-25 MCG/ACT AEPB INHALE 1 INHALATION BY MOUTH  DAILY AT 6 AM   No facility-administered encounter medications on file as of 04/21/2023.    Allergies (verified) Patient has no known allergies.   History: Past Medical History:  Diagnosis Date   Diabetes mellitus without complication (HCC)    Past Surgical History:  Procedure Laterality Date   CATARACT EXTRACTION Right    COLONOSCOPY     TRIGGER FINGER RELEASE     Family History  Problem Relation Age of Onset   Ovarian cancer Mother    Glaucoma Father    Colon cancer Father    Diabetes Neg Hx    Social History   Socioeconomic History   Marital status: Married    Spouse name: Not on file   Number of children: 2   Years of education: Not on file   Highest education level: Not on file  Occupational History   Occupation: Retired  Tobacco Use   Smoking  status: Never   Smokeless tobacco: Never  Vaping Use   Vaping status: Never Used  Substance and Sexual Activity   Alcohol use: Not Currently   Drug use: Never   Sexual activity: Yes    Birth control/protection: Post-menopausal  Other Topics Concern   Not on file  Social History Narrative   Lives with husband   Social Determinants of Health   Financial Resource Strain: Low Risk  (04/21/2023)   Overall Financial Resource Strain (CARDIA)    Difficulty of Paying Living Expenses: Not hard at all  Food Insecurity: No Food Insecurity (04/21/2023)   Hunger Vital Sign    Worried About Running Out of Food in the Last Year: Never true    Ran Out of Food in the Last Year: Never true  Transportation Needs: No Transportation Needs (04/21/2023)   PRAPARE - Administrator, Civil Service (Medical): No    Lack of Transportation (Non-Medical): No  Physical Activity: Sufficiently Active (04/21/2023)   Exercise Vital Sign    Days of Exercise per Week: 7 days    Minutes of Exercise per Session: 40 min  Stress: No Stress Concern Present (04/21/2023)   [employer] of Occupational Health - Occupational Stress Questionnaire    Feeling of Stress : Not at all  Social Connections: Moderately Isolated (04/21/2023)   Social Connection and Isolation Panel [NHANES]    Frequency of Communication with Friends and Family: More than three times a week    Frequency of Social Gatherings with Friends and Family: More than three times a week    Attends Religious Services: Never    Database administrator or Organizations: No    Attends Engineer, structural: Not on file    Marital Status: Married    Tobacco Counseling Counseling given: Not Answered   Clinical Intake:  Pre-visit preparation completed: Yes  Pain : No/denies pain     BMI - recorded: 26.04 Nutritional Risks: None Diabetes: Yes CBG done?: No Did pt. bring in CBG monitor from home?: No  How often do you need to have  someone help you when you read instructions, pamphlets, or other written materials from your doctor or pharmacy?: 1 - Never  Interpreter Needed?: No  Information entered by :: Wyett Narine, RMA   Activities of Daily Living    04/21/2023    1:51 PM  In your present state of health, do you have any difficulty performing the following activities:  Hearing? 0  Vision? 0  Difficulty concentrating or making decisions? 0  Walking or climbing stairs? 0  Dressing or bathing? 0  Doing errands, shopping? 0  Preparing Food and eating ? N  Using the Toilet? N  In the past six months, have you accidently leaked urine? N  Do you have problems with loss of bowel control? N  Managing  your Medications? N  Managing your Finances? N  Housekeeping or managing your Housekeeping? N    Patient Care Team: Etta Grandchild, MD as PCP - General (Internal Medicine)  Indicate any recent Medical Services you may have received from other than [facility] providers in the past year (date may be approximate).     Assessment:   This is a routine wellness examination for Anthea.  Hearing/Vision screen Hearing Screening - Comments:: Denies hearing difficulties   Vision Screening - Comments:: Wears eyeglasses   Goals Addressed               This Visit's Progress     Patient Stated (pt-stated)        Want to stay healthy.      Depression Screen    04/21/2023    1:59 PM 08/29/2022   11:12 AM 04/28/2022   10:36 AM 12/17/2020   10:16 AM  PHQ 2/9 Scores  PHQ - 2 Score 0 0 0 0  PHQ- 9 Score 1  0     Fall Risk    04/21/2023    1:57 PM 08/29/2022   11:12 AM 07/28/2022    1:09 PM 12/17/2020   10:16 AM  Fall Risk   Falls in the past year? 0 0 0 0  Number falls in past yr: 0 0 0 0  Injury with Fall? 0 0 0 0  Risk for fall due to : No Fall Risks No Fall Risks No Fall Risks   Follow up Falls evaluation completed;Falls prevention discussed Falls evaluation completed Falls evaluation completed      MEDICARE RISK AT HOME: Medicare Risk at Home Any stairs in or around the home?: No Home free of loose throw rugs in walkways, pet beds, electrical cords, etc?: Yes Adequate lighting in your home to reduce risk of falls?: Yes Life alert?: Yes Use of a cane, walker or w/c?: No Grab bars in the bathroom?: No Shower chair or bench in shower?: No Elevated toilet seat or a handicapped toilet?: No  TIMED UP AND GO:  Was the test performed?  No    Cognitive Function:        04/21/2023    2:02 PM  6CIT Screen  What Year? 0 points  What month? 0 points  What time? 0 points  Count back from 20 0 points  Months in reverse 4 points  Repeat phrase 2 points  Total Score 6 points    Immunizations Immunization History  Administered Date(s) Administered   Influenza, High Dose Seasonal PF 08/11/2022, 02/27/2023   Influenza,inj,Quad PF,6+ Mos 05/27/2021, 04/28/2022   Influenza-Unspecified 03/27/2023   Moderna Covid-19 Fall Seasonal Vaccine 64yrs & older 03/30/2023   Moderna Sars-Covid-2 Vaccination 10/19/2019, 11/17/2019, 03/28/2020   PFIZER(Purple Top)SARS-COV-2 Vaccination 03/28/2020, 11/16/2020, 08/07/2021, 03/12/2022   PNEUMOCOCCAL CONJUGATE-20 03/24/2021   Tdap 02/27/2023   Zoster Recombinant(Shingrix) 03/24/2021, 06/29/2021    TDAP status: Up to date  Flu Vaccine status: Up to date  Pneumococcal vaccine status: Up to date  Covid-19 vaccine status: Information provided on how to obtain vaccines.   Qualifies for Shingles Vaccine? Yes   Zostavax completed Yes   Shingrix Completed?: Yes  Screening Tests Health Maintenance  Topic Date Due   DEXA SCAN  Never done   FOOT EXAM  07/29/2023   COVID-19 Vaccine (9 - 2023-24 season) 07/30/2023   HEMOGLOBIN A1C  08/30/2023   OPHTHALMOLOGY EXAM  11/15/2023   Diabetic kidney evaluation - Urine ACR  11/16/2023   Diabetic kidney  evaluation - eGFR measurement  02/27/2024   Medicare Annual Wellness (AWV)  04/20/2024   MAMMOGRAM   06/14/2024   Cervical Cancer Screening (HPV/Pap Cotest)  05/21/2026   Colonoscopy  10/27/2029   DTaP/Tdap/Td (2 - Td or Tdap) 02/26/2033   Pneumonia Vaccine 55+ Years old  Completed   INFLUENZA VACCINE  Completed   Hepatitis C Screening  Completed   HIV Screening  Completed   Zoster Vaccines- Shingrix  Completed   HPV VACCINES  Aged Out    Health Maintenance  Health Maintenance Due  Topic Date Due   DEXA SCAN  Never done    Colorectal cancer screening: Type of screening: Colonoscopy. Completed 10/28/2019. Repeat every 10 years  Mammogram status: Completed 06/03/2022. Repeat every year  Bone Density status: Ordered 10/07/2022. Pt provided with contact info and advised to call to schedule appt.  Lung Cancer Screening: (Low Dose CT Chest recommended if Age 15-80 years, 20 pack-year currently smoking OR have quit w/in 15years.) does not qualify.   Lung Cancer Screening Referral: N/A  Additional Screening:  Hepatitis C Screening: does qualify; Completed 03/24/2021  Vision Screening: Recommended annual ophthalmology exams for early detection of glaucoma and other disorders of the eye. Is the patient up to date with their annual eye exam?  Yes  Who is the provider or what is the name of the office in which the patient attends annual eye exams? [facility] If pt is not established with a provider, would they like to be referred to a provider to establish care? No .   Dental Screening: Recommended annual dental exams for proper oral hygiene  Diabetic Foot Exam: Diabetic Foot Exam: Completed 07/28/2022  Community Resource Referral / Chronic Care Management: CRR required this visit?  No   CCM required this visit?  No     Plan:     I have personally reviewed and noted the following in the patient's chart:   Medical and social history Use of alcohol, tobacco or illicit drugs  Current medications and supplements including opioid prescriptions. Patient is not  currently taking opioid prescriptions. Functional ability and status Nutritional status Physical activity Advanced directives List of other physicians Hospitalizations, surgeries, and ER visits in previous 12 months Vitals Screenings to include cognitive, depression, and falls Referrals and appointments  In addition, I have reviewed and discussed with patient certain preventive protocols, quality metrics, and best practice recommendations. A written personalized care plan for preventive services as well as general preventive health recommendations were provided to patient.     Lavetta Geier L Claressa Hughley, CMA   04/21/2023   After Visit Summary: (MyChart) Due to this being a telephonic visit, the after visit summary with patients personalized plan was offered to patient via MyChart   Nurse Notes: Patient is up to date on her health maintenance.  She did state that she would like to know why her colonoscopy is scheduled for 10 years out instead of 5 years, as her father has a history of colon cancer.  She would like to discuss during her next office visit Dr. Yetta Barre.  Patient is up to date with her health maintenance.  She had no other concerns to address today.

## 2023-04-21 NOTE — Patient Instructions (Addendum)
Catherine Murphy , Thank you for taking time to come for your Medicare Wellness Visit. I appreciate your ongoing commitment to your health goals. Please review the following plan we discussed and let me know if I can assist you in the future.   Referrals/Orders/Follow-Ups/Clinician Recommendations: You are doing  a great job.  Remember to ask your provider about the 10 years in between colonoscopies during your next office visit.   This is a list of the screening recommended for you and due dates:  Health Maintenance  Topic Date Due   DEXA scan (bone density measurement)  Never done   Flu Shot  03/02/2023   Complete foot exam   07/29/2023   COVID-19 Vaccine (9 - 2023-24 season) 07/30/2023   Hemoglobin A1C  08/30/2023   Eye exam for diabetics  11/15/2023   Yearly kidney health urinalysis for diabetes  11/16/2023   Yearly kidney function blood test for diabetes  02/27/2024   Medicare Annual Wellness Visit  04/20/2024   Mammogram  06/14/2024   Pap with HPV screening  05/21/2026   Colon Cancer Screening  10/27/2029   DTaP/Tdap/Td vaccine (2 - Td or Tdap) 02/26/2033   Pneumonia Vaccine  Completed   Hepatitis C Screening  Completed   HIV Screening  Completed   Zoster (Shingles) Vaccine  Completed   HPV Vaccine  Aged Out    Advanced directives: (Copy Requested) Please bring a copy of your health care power of attorney and living will to the office to be added to your chart at your convenience.  Next Medicare Annual Wellness Visit scheduled for next year: Yes

## 2023-04-22 ENCOUNTER — Other Ambulatory Visit: Payer: Self-pay | Admitting: Internal Medicine

## 2023-04-22 DIAGNOSIS — F411 Generalized anxiety disorder: Secondary | ICD-10-CM

## 2023-04-22 DIAGNOSIS — J301 Allergic rhinitis due to pollen: Secondary | ICD-10-CM

## 2023-04-22 MED ORDER — HYDROXYZINE HCL 10 MG PO TABS
10.0000 mg | ORAL_TABLET | Freq: Three times a day (TID) | ORAL | 0 refills | Status: AC | PRN
Start: 2023-04-22 — End: ?

## 2023-05-10 ENCOUNTER — Other Ambulatory Visit: Payer: Self-pay | Admitting: Internal Medicine

## 2023-05-10 DIAGNOSIS — R0982 Postnasal drip: Secondary | ICD-10-CM

## 2023-05-30 ENCOUNTER — Telehealth: Payer: Self-pay | Admitting: Internal Medicine

## 2023-05-30 NOTE — Telephone Encounter (Signed)
Please reach out to the pt about her Continuous Blood GlucSensor (DEXCOM G6 SENSOR) MISC. I do not know what she is saying about we I suppose to be sending this device can someone reach out to her to better assist this pt.

## 2023-05-30 NOTE — Telephone Encounter (Signed)
Updated office notes has been faxed to [facility]. Attention: CARA

## 2023-05-31 ENCOUNTER — Encounter: Payer: Self-pay | Admitting: Internal Medicine

## 2023-06-05 ENCOUNTER — Ambulatory Visit
Admission: RE | Admit: 2023-06-05 | Discharge: 2023-06-05 | Disposition: A | Discharge status: Home / Self Care | Payer: Medicare Other | Source: Ambulatory Visit | Attending: Internal Medicine | Admitting: Internal Medicine

## 2023-06-05 DIAGNOSIS — E2839 Other primary ovarian failure: Secondary | ICD-10-CM

## 2023-06-06 ENCOUNTER — Other Ambulatory Visit: Payer: Self-pay | Admitting: Internal Medicine

## 2023-06-06 DIAGNOSIS — J4530 Mild persistent asthma, uncomplicated: Secondary | ICD-10-CM

## 2023-06-08 LAB — COMPREHENSIVE METABOLIC PANEL: eGFR: 96

## 2023-06-08 LAB — HEMOGLOBIN A1C: Hemoglobin A1C: 7.5

## 2023-06-08 LAB — BASIC METABOLIC PANEL
BUN: 22 — AB (ref 4–21)
Creatinine: 0.7 (ref 0.5–1.1)
Glucose: 97
Potassium: 4.1 meq/L (ref 3.5–5.1)

## 2023-06-15 ENCOUNTER — Other Ambulatory Visit: Payer: Self-pay | Admitting: Internal Medicine

## 2023-06-15 DIAGNOSIS — E1065 Type 1 diabetes mellitus with hyperglycemia: Secondary | ICD-10-CM

## 2023-07-13 ENCOUNTER — Encounter: Payer: Self-pay | Admitting: Emergency Medicine

## 2023-07-13 ENCOUNTER — Ambulatory Visit: Payer: Medicare Other | Admitting: Emergency Medicine

## 2023-07-13 VITALS — BP 128/62 | HR 91 | Temp 98.5°F | Ht 63.0 in

## 2023-07-13 DIAGNOSIS — J4530 Mild persistent asthma, uncomplicated: Secondary | ICD-10-CM | POA: Diagnosis not present

## 2023-07-13 DIAGNOSIS — K219 Gastro-esophageal reflux disease without esophagitis: Secondary | ICD-10-CM | POA: Diagnosis not present

## 2023-07-13 MED ORDER — FAMOTIDINE 40 MG PO TABS: 40.0000 mg | ORAL_TABLET | Freq: Every day | ORAL | 1 refills | Status: DC

## 2023-07-13 NOTE — Progress Notes (Signed)
Catherine Murphy 67 y.o.   Chief Complaint  Patient presents with   Cough    Patient states her its her asthma. Has been going on for months and her inhaler are not working. She is having sob all the time     HISTORY OF PRESENT ILLNESS: Acute problem visit today.  Patient of Dr. Sanda Linger. This is a 67 y.o. female complaining of cough and occasional wheezing mostly at nighttime.  Thanks her inhalers are not working Occasional shortness of breath.  No flulike symptoms.  No fever  Cough Associated symptoms include shortness of breath and wheezing. Pertinent negatives include no chest pain, chills, fever, headaches, rash or sore throat.     Prior to Admission medications   Medication Sig Start Date End Date Taking? Authorizing Provider  albuterol (VENTOLIN HFA) 108 (90 Base) MCG/ACT inhaler Inhale 2 puffs into the lungs every 6 (six) hours as needed for wheezing or shortness of breath. 04/28/22  Yes Etta Grandchild, MD  Blood Glucose Monitoring Suppl (ONETOUCH VERIO) w/Device KIT Inject 1 Act into the skin 2 (two) times daily as needed. Use to monitor blood sugar twice a day. E11.9 02/23/22  Yes Etta Grandchild, MD  Continuous Blood Gluc Receiver (DEXCOM G7 RECEIVER) DEVI 1 Act by Does not apply route daily. 05/26/22  Yes Etta Grandchild, MD  Continuous Blood Gluc Sensor (DEXCOM G6 SENSOR) MISC Change every 10 days 05/30/22  Yes Reather Littler, MD  Continuous Blood Gluc Transmit (DEXCOM G6 TRANSMITTER) MISC Change every 3 months 05/30/22  Yes Reather Littler, MD  hydrOXYzine (ATARAX) 10 MG tablet Take 1 tablet (10 mg total) by mouth 3 (three) times daily as needed. 04/22/23  Yes Etta Grandchild, MD  indapamide (LOZOL) 1.25 MG tablet Take 1 tablet (1.25 mg total) by mouth daily. 08/17/22  Yes Etta Grandchild, MD  Insulin Pen Needle (B-D UF III MINI PEN NEEDLES) 31G X 5 MM MISC by Does not apply route.   Yes [provider]  ipratropium (ATROVENT) 0.03 % nasal spray USE 2 SPRAYS IN BOTH  NOSTRILS  EVERY 12 HOURS 05/10/23  Yes Etta Grandchild, MD  Lancets (ONETOUCH DELICA PLUS LANCET33G) MISC USE TO CHECK BLOOD SUGAR TWICE A DAY. E11.9 12/29/22  Yes Etta Grandchild, MD  losartan (COZAAR) 100 MG tablet TAKE 1 TABLET BY MOUTH DAILY 01/21/23  Yes Etta Grandchild, MD  metFORMIN (GLUCOPHAGE-XR) 500 MG 24 hr tablet TAKE 2 TABLETS BY MOUTH DAILY  WITH BREAKFAST 04/21/23  Yes Etta Grandchild, MD  montelukast (SINGULAIR) 10 MG tablet Take 1 tablet (10 mg total) by mouth at bedtime. 11/14/22  Yes Etta Grandchild, MD  NOVOLOG 100 UNIT/ML injection INJECT SUBCUTANEOUSLY FOR USE IN PUMP, TOTAL OF 60 UNITS PER DAY 06/15/23  Yes Etta Grandchild, MD  [facility] VERIO test strip USE 1 STRIP IN THE MORNING, AT NOON, AND AT BEDTIME AS DIRECTED 01/11/23  Yes Etta Grandchild, MD  simvastatin (ZOCOR) 10 MG tablet TAKE 1 TABLET BY MOUTH AT  BEDTIME 04/21/23  Yes Etta Grandchild, MD  umeclidinium-vilanterol [facility] ELLIPTA) 62.5-25 MCG/ACT AEPB INHALE 1 INHALATION BY MOUTH  DAILY AT 6 AM 06/06/23  Yes Etta Grandchild, MD    No Known Allergies  Patient Active Problem List   Diagnosis Date Noted   Need for prophylactic vaccination with combined diphtheria-tetanus-pertussis (DTP) vaccine 02/27/2023   Seasonal allergic rhinitis due to pollen 10/27/2022   Estrogen deficiency 10/27/2022   Insulin-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 (HCC) 08/25/2022   Hypertension associated with diabetes (HCC) 08/17/2022   Chronic renal disease, stage 2, mildly decreased glomerular filtration rate (GFR) between 60-89 mL/min/1.73 square meter 07/29/2022   Microalbuminuria due to type 2 diabetes mellitus (HCC) 07/29/2022   DDD (degenerative disc disease), lumbar 07/29/2022   Chronic bilateral low back pain with bilateral sciatica 07/28/2022   Mild persistent asthma without complication 04/28/2022   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) 11/08/2021   Nuclear sclerotic cataract of left eye 11/08/2021    Pseudophakia, right eye 11/08/2021   Epiretinal membrane 09/28/2021   Hyperlipidemia with target LDL less than 100 03/24/2021   Screening for cervical cancer 03/24/2021   Encounter for long-term (current) use of insulin (HCC) 03/24/2021   Visit for screening mammogram 03/09/2021    Past Medical History:  Diagnosis Date   Diabetes mellitus without complication (HCC)     Past Surgical History:  Procedure Laterality Date   CATARACT EXTRACTION Right    COLONOSCOPY     TRIGGER FINGER RELEASE      Social History   Socioeconomic History   Marital status: Married    Spouse name: Not on file   Number of children: 2   Years of education: Not on file   Highest education level: Not on file  Occupational History   Occupation: Retired  Tobacco Use   Smoking status: Never   Smokeless tobacco: Never  Vaping Use   Vaping status: Never Used  Substance and Sexual Activity   Alcohol use: Not Currently   Drug use: Never   Sexual activity: Yes    Birth control/protection: Post-menopausal  Other Topics Concern   Not on file  Social History Narrative   Lives with husband   Social Drivers of Health   Financial Resource Strain: Low Risk  (04/21/2023)   Overall Financial Resource Strain (CARDIA)    Difficulty of Paying Living Expenses: Not hard at all  Food Insecurity: No Food Insecurity (04/21/2023)   Hunger Vital Sign    Worried About Running Out of Food in the Last Year: Never true    Ran Out of Food in the Last Year: Never true  Transportation Needs: No Transportation Needs (04/21/2023)   PRAPARE - Administrator, Civil Service (Medical): No    Lack of Transportation (Non-Medical): No  Physical Activity: Sufficiently Active (04/21/2023)   Exercise Vital Sign    Days of Exercise per Week: 7 days    Minutes of Exercise per Session: 40 min  Stress: No Stress Concern Present (04/21/2023)   [employer] of Occupational Health - Occupational Stress Questionnaire     Feeling of Stress : Not at all  Social Connections: Moderately Isolated (04/21/2023)   Social Connection and Isolation Panel [NHANES]    Frequency of Communication with Friends and Family: More than three times a week    Frequency of Social Gatherings with Friends and Family: More than three times a week    Attends Religious Services: Never    Database administrator or Organizations: No    Attends Engineer, structural: Not on file    Marital Status: Married  Catering manager Violence: Not At Risk (04/21/2023)   Humiliation, Afraid, Rape, and Kick questionnaire    Fear of Current or Ex-Partner: No    Emotionally Abused: No    Physically Abused: No    Sexually Abused: No    Family History  Problem Relation Age of Onset   Ovarian  cancer Mother    Glaucoma Father    Colon cancer Father    Diabetes Neg Hx      Review of Systems  Constitutional: Negative.  Negative for chills and fever.  HENT: Negative.  Negative for congestion and sore throat.   Respiratory:  Positive for cough, shortness of breath and wheezing.   Cardiovascular: Negative.  Negative for chest pain and palpitations.  Gastrointestinal:  Negative for abdominal pain, nausea and vomiting.  Skin: Negative.  Negative for rash.  Neurological: Negative.  Negative for dizziness and headaches.  All other systems reviewed and are negative.   Today's Vitals   07/13/23 1331  BP: 128/62  Pulse: 91  Temp: 98.5 F (36.9 C)  TempSrc: Oral  SpO2: 95%  Height: 5\' 3"  (1.6 m)   Body mass index is 26.04 kg/m.   Physical Exam Vitals reviewed.  Constitutional:      Appearance: Normal appearance.  HENT:     Head: Normocephalic.     Mouth/Throat:     Mouth: Mucous membranes are moist.     Pharynx: Oropharynx is clear.  Eyes:     Extraocular Movements: Extraocular movements intact.     Pupils: Pupils are equal, round, and reactive to light.  Cardiovascular:     Rate and Rhythm: Normal rate and regular rhythm.      Pulses: Normal pulses.     Heart sounds: Normal heart sounds.  Pulmonary:     Effort: Pulmonary effort is normal.     Breath sounds: Normal breath sounds.  Abdominal:     Tenderness: There is no abdominal tenderness.  Musculoskeletal:     Cervical back: No tenderness.  Lymphadenopathy:     Cervical: No cervical adenopathy.  Skin:    General: Skin is warm and dry.  Neurological:     Mental Status: She is alert and oriented to person, place, and time.  Psychiatric:        Mood and Affect: Mood normal.        Behavior: Behavior normal.      ASSESSMENT & PLAN: A total of 33 minutes was spent with the patient and counseling/coordination of care regarding preparing for this visit, review of most recent office visit notes, review of multiple chronic medical conditions under management, review of all medications, diagnosis of asthma and possible laryngopharyngeal reflux aggravating this chronic condition, treatment of reflux with no medication, prognosis, documentation and need for follow-up  Problem List Items Addressed This Visit       Respiratory   Mild persistent asthma without complication   Clinically stable.  Unable to use corticosteroids.  Diabetic. Continue Anoro Ellipta and albuterol inhalers as prescribed May have a reflux component Recommend to treat with bedtime famotidine 40 mg      Laryngopharyngeal reflux (LPR) - Primary   Contributing and aggravating her asthma. Symptoms mostly at nighttime Recommend trial of famotidine 40 mg at bedtime for 2 weeks Continue asthma inhalers as prescribed Follow-up with PCP in a couple weeks recommended      Relevant Medications   famotidine (PEPCID) 40 MG tablet   Patient Instructions  Gastroesophageal Reflux Disease, Adult  Gastroesophageal reflux (GER) happens when acid from the stomach flows up into the tube that connects the mouth and the stomach (esophagus). Normally, food travels down the esophagus and stays in the  stomach to be digested. With GER, food and stomach acid sometimes move back up into the esophagus. You may have a disease called gastroesophageal reflux disease (GERD)  if the reflux: Happens often. Causes frequent or very bad symptoms. Causes problems such as damage to the esophagus. When this happens, the esophagus becomes sore and swollen. Over time, GERD can make small holes (ulcers) in the lining of the esophagus. What are the causes? This condition is caused by a problem with the muscle between the esophagus and the stomach. When this muscle is weak or not normal, it does not close properly to keep food and acid from coming back up from the stomach. The muscle can be weak because of: Tobacco use. Pregnancy. Having a certain type of hernia (hiatal hernia). Alcohol use. Certain foods and drinks, such as coffee, chocolate, onions, and peppermint. What increases the risk? Being overweight. Having a disease that affects your connective tissue. Taking NSAIDs, such a ibuprofen. What are the signs or symptoms? Heartburn. Difficult or painful swallowing. The feeling of having a lump in the throat. A bitter taste in the mouth. Bad breath. Having a lot of saliva. Having an upset or bloated stomach. Burping. Chest pain. Different conditions can cause chest pain. Make sure you see your doctor if you have chest pain. Shortness of breath or wheezing. A long-term cough or a cough at night. Wearing away of the surface of teeth (tooth enamel). Weight loss. How is this treated? Making changes to your diet. Taking medicine. Having surgery. Treatment will depend on how bad your symptoms are. Follow these instructions at home: Eating and drinking  Follow a diet as told by your doctor. You may need to avoid foods and drinks such as: Coffee and tea, with or without caffeine. Drinks that contain alcohol. Energy drinks and sports drinks. Bubbly (carbonated) drinks or sodas. Chocolate and  cocoa. Peppermint and mint flavorings. Garlic and onions. Horseradish. Spicy and acidic foods. These include peppers, chili powder, curry powder, vinegar, hot sauces, and BBQ sauce. Citrus fruit juices and citrus fruits, such as oranges, lemons, and limes. Tomato-based foods. These include red sauce, chili, salsa, and pizza with red sauce. Fried and fatty foods. These include donuts, french fries, potato chips, and high-fat dressings. High-fat meats. These include hot dogs, rib eye steak, sausage, ham, and bacon. High-fat dairy items, such as whole milk, butter, and cream cheese. Eat small meals often. Avoid eating large meals. Avoid drinking large amounts of liquid with your meals. Avoid eating meals during the 2-3 hours before bedtime. Avoid lying down right after you eat. Do not exercise right after you eat. Lifestyle  Do not smoke or use any products that contain nicotine or tobacco. If you need help quitting, ask your doctor. Try to lower your stress. If you need help doing this, ask your doctor. If you are overweight, lose an amount of weight that is healthy for you. Ask your doctor about a safe weight loss goal. General instructions Pay attention to any changes in your symptoms. Take over-the-counter and prescription medicines only as told by your doctor. Do not take aspirin, ibuprofen, or other NSAIDs unless your doctor says it is okay. Wear loose clothes. Do not wear anything tight around your waist. Raise (elevate) the head of your bed about 6 inches (15 cm). You may need to use a wedge to do this. Avoid bending over if this makes your symptoms worse. Keep all follow-up visits. Contact a doctor if: You have new symptoms. You lose weight and you do not know why. You have trouble swallowing or it hurts to swallow. You have wheezing or a cough that keeps happening. You have  a hoarse voice. Your symptoms do not get better with treatment. Get help right away if: You have  sudden pain in your arms, neck, jaw, teeth, or back. You suddenly feel sweaty, dizzy, or light-headed. You have chest pain or shortness of breath. You vomit and the vomit is green, yellow, or black, or it looks like blood or coffee grounds. You faint. Your poop (stool) is red, bloody, or black. You cannot swallow, drink, or eat. These symptoms may represent a serious problem that is an emergency. Do not wait to see if the symptoms will go away. Get medical help right away. Call your local emergency services (911 in the [place] Do not drive yourself to the hospital. Summary If a person has gastroesophageal reflux disease (GERD), food and stomach acid move back up into the esophagus and cause symptoms or problems such as damage to the esophagus. Treatment will depend on how bad your symptoms are. Follow a diet as told by your doctor. Take all medicines only as told by your doctor. This information is not intended to replace advice given to you by your health care provider. Make sure you discuss any questions you have with your health care provider. Document Revised: 01/27/2020 Document Reviewed: 01/27/2020 Elsevier Patient Education  2024 Elsevier Inc.    Edwina Barth, MD [facility]

## 2023-07-13 NOTE — Assessment & Plan Note (Signed)
Clinically stable.  Unable to use corticosteroids.  Diabetic. Continue Anoro Ellipta and albuterol inhalers as prescribed May have a reflux component Recommend to treat with bedtime famotidine 40 mg

## 2023-07-13 NOTE — Assessment & Plan Note (Signed)
Contributing and aggravating her asthma. Symptoms mostly at nighttime Recommend trial of famotidine 40 mg at bedtime for 2 weeks Continue asthma inhalers as prescribed Follow-up with PCP in a couple weeks recommended

## 2023-07-13 NOTE — Patient Instructions (Signed)
Gastroesophageal Reflux Disease, Adult  Gastroesophageal reflux (GER) happens when acid from the stomach flows up into the tube that connects the mouth and the stomach (esophagus). Normally, food travels down the esophagus and stays in the stomach to be digested. With GER, food and stomach acid sometimes move back up into the esophagus. You may have a disease called gastroesophageal reflux disease (GERD) if the reflux: Happens often. Causes frequent or very bad symptoms. Causes problems such as damage to the esophagus. When this happens, the esophagus becomes sore and swollen. Over time, GERD can make small holes (ulcers) in the lining of the esophagus. What are the causes? This condition is caused by a problem with the muscle between the esophagus and the stomach. When this muscle is weak or not normal, it does not close properly to keep food and acid from coming back up from the stomach. The muscle can be weak because of: Tobacco use. Pregnancy. Having a certain type of hernia (hiatal hernia). Alcohol use. Certain foods and drinks, such as coffee, chocolate, onions, and peppermint. What increases the risk? Being overweight. Having a disease that affects your connective tissue. Taking NSAIDs, such a ibuprofen. What are the signs or symptoms? Heartburn. Difficult or painful swallowing. The feeling of having a lump in the throat. A bitter taste in the mouth. Bad breath. Having a lot of saliva. Having an upset or bloated stomach. Burping. Chest pain. Different conditions can cause chest pain. Make sure you see your doctor if you have chest pain. Shortness of breath or wheezing. A long-term cough or a cough at night. Wearing away of the surface of teeth (tooth enamel). Weight loss. How is this treated? Making changes to your diet. Taking medicine. Having surgery. Treatment will depend on how bad your symptoms are. Follow these instructions at home: Eating and drinking  Follow a  diet as told by your doctor. You may need to avoid foods and drinks such as: Coffee and tea, with or without caffeine. Drinks that contain alcohol. Energy drinks and sports drinks. Bubbly (carbonated) drinks or sodas. Chocolate and cocoa. Peppermint and mint flavorings. Garlic and onions. Horseradish. Spicy and acidic foods. These include peppers, chili powder, curry powder, vinegar, hot sauces, and BBQ sauce. Citrus fruit juices and citrus fruits, such as oranges, lemons, and limes. Tomato-based foods. These include red sauce, chili, salsa, and pizza with red sauce. Fried and fatty foods. These include donuts, french fries, potato chips, and high-fat dressings. High-fat meats. These include hot dogs, rib eye steak, sausage, ham, and bacon. High-fat dairy items, such as whole milk, butter, and cream cheese. Eat small meals often. Avoid eating large meals. Avoid drinking large amounts of liquid with your meals. Avoid eating meals during the 2-3 hours before bedtime. Avoid lying down right after you eat. Do not exercise right after you eat. Lifestyle  Do not smoke or use any products that contain nicotine or tobacco. If you need help quitting, ask your doctor. Try to lower your stress. If you need help doing this, ask your doctor. If you are overweight, lose an amount of weight that is healthy for you. Ask your doctor about a safe weight loss goal. General instructions Pay attention to any changes in your symptoms. Take over-the-counter and prescription medicines only as told by your doctor. Do not take aspirin, ibuprofen, or other NSAIDs unless your doctor says it is okay. Wear loose clothes. Do not wear anything tight around your waist. Raise (elevate) the head of your bed about   6 inches (15 cm). You may need to use a wedge to do this. Avoid bending over if this makes your symptoms worse. Keep all follow-up visits. Contact a doctor if: You have new symptoms. You lose weight and you  do not know why. You have trouble swallowing or it hurts to swallow. You have wheezing or a cough that keeps happening. You have a hoarse voice. Your symptoms do not get better with treatment. Get help right away if: You have sudden pain in your arms, neck, jaw, teeth, or back. You suddenly feel sweaty, dizzy, or light-headed. You have chest pain or shortness of breath. You vomit and the vomit is green, yellow, or black, or it looks like blood or coffee grounds. You faint. Your poop (stool) is red, bloody, or black. You cannot swallow, drink, or eat. These symptoms may represent a serious problem that is an emergency. Do not wait to see if the symptoms will go away. Get medical help right away. Call your local emergency services (911 in the [place] Do not drive yourself to the hospital. Summary If a person has gastroesophageal reflux disease (GERD), food and stomach acid move back up into the esophagus and cause symptoms or problems such as damage to the esophagus. Treatment will depend on how bad your symptoms are. Follow a diet as told by your doctor. Take all medicines only as told by your doctor. This information is not intended to replace advice given to you by your health care provider. Make sure you discuss any questions you have with your health care provider. Document Revised: 01/27/2020 Document Reviewed: 01/27/2020 Elsevier Patient Education  2024 Elsevier Inc.  

## 2023-07-17 ENCOUNTER — Other Ambulatory Visit: Payer: Self-pay | Admitting: Internal Medicine

## 2023-07-17 DIAGNOSIS — Z1231 Encounter for screening mammogram for malignant neoplasm of breast: Secondary | ICD-10-CM

## 2023-07-23 ENCOUNTER — Other Ambulatory Visit: Payer: Self-pay | Admitting: Internal Medicine

## 2023-07-23 DIAGNOSIS — R809 Proteinuria, unspecified: Secondary | ICD-10-CM

## 2023-07-23 DIAGNOSIS — I10 Essential (primary) hypertension: Secondary | ICD-10-CM

## 2023-07-26 ENCOUNTER — Other Ambulatory Visit: Payer: Self-pay | Admitting: Internal Medicine

## 2023-07-26 DIAGNOSIS — J4530 Mild persistent asthma, uncomplicated: Secondary | ICD-10-CM

## 2023-08-07 ENCOUNTER — Ambulatory Visit: Payer: Medicare Other | Admitting: Internal Medicine

## 2023-08-07 ENCOUNTER — Encounter: Payer: Self-pay | Admitting: Internal Medicine

## 2023-08-07 VITALS — BP 146/74 | HR 65 | Temp 98.4°F | Resp 16 | Ht 63.0 in | Wt 152.8 lb

## 2023-08-07 DIAGNOSIS — J45909 Unspecified asthma, uncomplicated: Secondary | ICD-10-CM | POA: Insufficient documentation

## 2023-08-07 DIAGNOSIS — R809 Proteinuria, unspecified: Secondary | ICD-10-CM | POA: Diagnosis not present

## 2023-08-07 DIAGNOSIS — E785 Hyperlipidemia, unspecified: Secondary | ICD-10-CM | POA: Diagnosis not present

## 2023-08-07 DIAGNOSIS — J4530 Mild persistent asthma, uncomplicated: Secondary | ICD-10-CM

## 2023-08-07 DIAGNOSIS — E1129 Type 2 diabetes mellitus with other diabetic kidney complication: Secondary | ICD-10-CM

## 2023-08-07 DIAGNOSIS — I152 Hypertension secondary to endocrine disorders: Secondary | ICD-10-CM | POA: Diagnosis not present

## 2023-08-07 DIAGNOSIS — E1159 Type 2 diabetes mellitus with other circulatory complications: Secondary | ICD-10-CM | POA: Diagnosis not present

## 2023-08-07 DIAGNOSIS — Z794 Long term (current) use of insulin: Secondary | ICD-10-CM | POA: Diagnosis not present

## 2023-08-07 DIAGNOSIS — Z1231 Encounter for screening mammogram for malignant neoplasm of breast: Secondary | ICD-10-CM

## 2023-08-07 DIAGNOSIS — E1065 Type 1 diabetes mellitus with hyperglycemia: Secondary | ICD-10-CM | POA: Insufficient documentation

## 2023-08-07 DIAGNOSIS — E119 Type 2 diabetes mellitus without complications: Secondary | ICD-10-CM

## 2023-08-07 LAB — URINALYSIS, ROUTINE W REFLEX MICROSCOPIC
Bilirubin Urine: NEGATIVE
Hgb urine dipstick: NEGATIVE
Ketones, ur: NEGATIVE
Leukocytes,Ua: NEGATIVE
Nitrite: NEGATIVE
RBC / HPF: NONE SEEN (ref 0–?)
Specific Gravity, Urine: 1.01 (ref 1.000–1.030)
Total Protein, Urine: NEGATIVE
Urine Glucose: NEGATIVE
Urobilinogen, UA: 0.2 (ref 0.0–1.0)
WBC, UA: NONE SEEN (ref 0–?)
pH: 6 (ref 5.0–8.0)

## 2023-08-07 LAB — HEPATIC FUNCTION PANEL
ALT: 12 U/L (ref 0–35)
AST: 20 U/L (ref 0–37)
Albumin: 4.3 g/dL (ref 3.5–5.2)
Alkaline Phosphatase: 44 U/L (ref 39–117)
Bilirubin, Direct: 0.1 mg/dL (ref 0.0–0.3)
Total Bilirubin: 0.6 mg/dL (ref 0.2–1.2)
Total Protein: 7.5 g/dL (ref 6.0–8.3)

## 2023-08-07 LAB — CBC WITH DIFFERENTIAL/PLATELET
Basophils Absolute: 0 10*3/uL (ref 0.0–0.1)
Basophils Relative: 0.8 % (ref 0.0–3.0)
Eosinophils Absolute: 0.1 10*3/uL (ref 0.0–0.7)
Eosinophils Relative: 2.4 % (ref 0.0–5.0)
HCT: 40.4 % (ref 36.0–46.0)
Hemoglobin: 13.3 g/dL (ref 12.0–15.0)
Lymphocytes Relative: 41.3 % (ref 12.0–46.0)
Lymphs Abs: 2 10*3/uL (ref 0.7–4.0)
MCHC: 32.9 g/dL (ref 30.0–36.0)
MCV: 92.3 fL (ref 78.0–100.0)
Monocytes Absolute: 0.3 10*3/uL (ref 0.1–1.0)
Monocytes Relative: 6.7 % (ref 3.0–12.0)
Neutro Abs: 2.4 10*3/uL (ref 1.4–7.7)
Neutrophils Relative %: 48.8 % (ref 43.0–77.0)
Platelets: 249 10*3/uL (ref 150.0–400.0)
RBC: 4.37 Mil/uL (ref 3.87–5.11)
RDW: 12.9 % (ref 11.5–15.5)
WBC: 4.8 10*3/uL (ref 4.0–10.5)

## 2023-08-07 LAB — MICROALBUMIN / CREATININE URINE RATIO
Creatinine,U: 35.5 mg/dL
Microalb Creat Ratio: 2 mg/g (ref 0.0–30.0)
Microalb, Ur: <0.7 mg/dL (ref 0.0–1.9)

## 2023-08-07 LAB — TSH: TSH: 2.98 u[IU]/mL (ref 0.35–5.50)

## 2023-08-07 MED ORDER — BUDESONIDE-FORMOTEROL FUMARATE 80-4.5 MCG/ACT IN AERO: 2.0000 | INHALATION_SPRAY | Freq: Two times a day (BID) | RESPIRATORY_TRACT | 1 refills | Status: DC

## 2023-08-07 NOTE — Progress Notes (Signed)
 Subjective:  Patient ID: Catherine Murphy, female    DOB: [birth year]  Age: 68 y.o. MRN: [id number]  CC: Hypertension, Hyperlipidemia, and Diabetes   HPI Catherine Murphy presents for f/up ----  Discussed the use of AI scribe software for clinical note transcription with the patient, who gave verbal consent to proceed.  History of Present Illness   The patient, with a history of asthma, diabetes, and hypertension, reports a worsening of asthma symptoms since October. She describes a daily need for an inhaler and difficulty breathing, particularly upon waking. She also notes a dry cough and nasal congestion, which she attributes to postnasal drip. However, she denies any chest pain, shortness of breath, or productive cough.  In addition to her respiratory symptoms, the patient reports recent episodes of dizziness and imbalance, which she believes may be related to her blood pressure or a need for iron. She denies any associated headaches or palpitations.  The patient also mentions a recent change in her diabetes management, with a new endocrinologist diagnosing her with type 1 diabetes after ten years of being treated for type 2 diabetes.  Lastly, the patient expresses concern about the use of steroids for her asthma due to a past episode of glaucoma. She is currently using two inhalers, neither of which contain steroids.       Outpatient Medications Prior to Visit  Medication Sig Dispense Refill   albuterol  (VENTOLIN  HFA) 108 (90 Base) MCG/ACT inhaler INHALE 2 PUFFS EVERY 6 HOURS AS NEEDED FOR WHEEZING OR SHORTNESS OF BREATH 8.5 each 4   Blood Glucose Monitoring Suppl (ONETOUCH VERIO) w/Device KIT Inject 1 Act into the skin 2 (two) times daily as needed. Use to monitor blood sugar twice a day. E11.9 1 kit 1   Continuous Blood Gluc Receiver (DEXCOM G7 RECEIVER) DEVI 1 Act by Does not apply route daily. 9 each 1   Continuous Blood Gluc Sensor (DEXCOM G6 SENSOR) MISC Change every 10 days 9 each  2   Continuous Blood Gluc Transmit (DEXCOM G6 TRANSMITTER) MISC Change every 3 months 1 each 2   hydrOXYzine  (ATARAX ) 10 MG tablet Take 1 tablet (10 mg total) by mouth 3 (three) times daily as needed. 270 tablet 0   Insulin  Pen Needle (B-D UF III MINI PEN NEEDLES) 31G X 5 MM MISC by Does not apply route.     ipratropium (ATROVENT ) 0.03 % nasal spray USE 2 SPRAYS IN BOTH NOSTRILS  EVERY 12 HOURS 60 mL 3   Lancets (ONETOUCH DELICA PLUS LANCET33G) MISC USE TO CHECK BLOOD SUGAR TWICE A DAY. E11.9 100 each 5   losartan  (COZAAR ) 100 MG tablet TAKE 1 TABLET BY MOUTH DAILY 90 tablet 3   metFORMIN  (GLUCOPHAGE -XR) 500 MG 24 hr tablet TAKE 2 TABLETS BY MOUTH DAILY  WITH BREAKFAST 180 tablet 1   montelukast  (SINGULAIR ) 10 MG tablet Take 1 tablet (10 mg total) by mouth at bedtime. 90 tablet 1   NOVOLOG  100 UNIT/ML injection INJECT SUBCUTANEOUSLY FOR USE IN PUMP, TOTAL OF 60 UNITS PER DAY 60 mL 1   ONETOUCH VERIO test strip USE 1 STRIP IN THE MORNING, AT NOON, AND AT BEDTIME AS DIRECTED 300 strip 1   simvastatin  (ZOCOR ) 10 MG tablet TAKE 1 TABLET BY MOUTH AT  BEDTIME 90 tablet 1   umeclidinium-vilanterol (ANORO ELLIPTA ) 62.5-25 MCG/ACT AEPB INHALE 1 INHALATION BY MOUTH  DAILY AT 6 AM 180 each 0   famotidine  (PEPCID ) 40 MG tablet Take 1 tablet (40 mg total) by mouth  at bedtime. (Patient not taking: Reported on 08/07/2023) 30 tablet 1   indapamide  (LOZOL ) 1.25 MG tablet Take 1 tablet (1.25 mg total) by mouth daily. (Patient not taking: Reported on 08/07/2023) 90 tablet 0   No facility-administered medications prior to visit.    ROS Review of Systems  Constitutional: Negative.  Negative for appetite change, chills, fatigue, fever and unexpected weight change.  HENT: Negative.  Negative for sore throat and trouble swallowing.   Eyes: Negative.  Negative for visual disturbance.  Respiratory:  Positive for wheezing. Negative for cough, chest tightness, shortness of breath and stridor.   Cardiovascular:  Negative  for chest pain, palpitations and leg swelling.  Gastrointestinal:  Negative for abdominal pain, constipation, diarrhea, nausea and vomiting.  Endocrine: Negative.   Genitourinary: Negative.  Negative for difficulty urinating and dysuria.  Musculoskeletal: Negative.  Negative for arthralgias and myalgias.  Skin: Negative.   Neurological:  Negative for dizziness, weakness and light-headedness.  Hematological:  Negative for adenopathy. Does not bruise/bleed easily.  Psychiatric/Behavioral:  Positive for confusion and decreased concentration. The patient is not nervous/anxious.     Objective:  BP (!) 146/74 (BP Location: Left Arm, Patient Position: Sitting, Cuff Size: Normal)   Pulse 65   Temp 98.4 F (36.9 C) (Oral)   Resp 16   Ht 5' 3 (1.6 m)   Wt 152 lb 13.6 oz (69.3 kg)   SpO2 99%   BMI 27.08 kg/m   BP Readings from Last 3 Encounters:  08/07/23 (!) 146/74  07/13/23 128/62  04/21/23 126/62    Wt Readings from Last 3 Encounters:  08/07/23 152 lb 13.6 oz (69.3 kg)  04/21/23 147 lb (66.7 kg)  02/27/23 147 lb 4.8 oz (66.8 kg)    Physical Exam Vitals reviewed.  Constitutional:      Appearance: Normal appearance.  HENT:     Mouth/Throat:     Mouth: Mucous membranes are moist.  Eyes:     General: No scleral icterus.    Conjunctiva/sclera: Conjunctivae normal.  Cardiovascular:     Rate and Rhythm: Normal rate and regular rhythm.     Heart sounds: Normal heart sounds, S1 normal and S2 normal. No murmur heard.    No gallop.     Comments: EKG- NSR, 60 bpm No LVH, Q waves, or ST/T wave changes  Pulmonary:     Effort: Pulmonary effort is normal.     Breath sounds: No stridor. No wheezing, rhonchi or rales.  Abdominal:     General: Abdomen is flat.     Palpations: There is no mass.     Tenderness: There is no abdominal tenderness. There is no guarding.     Hernia: No hernia is present.  Musculoskeletal:        General: No swelling. Normal range of motion.     Cervical  back: Neck supple.     Right lower leg: No edema.     Left lower leg: No edema.  Skin:    General: Skin is warm and dry.     Coloration: Skin is not pale.  Neurological:     General: No focal deficit present.     Mental Status: She is alert. Mental status is at baseline.  Psychiatric:        Mood and Affect: Mood normal.        Behavior: Behavior normal.     Lab Results  Component Value Date   WBC 4.8 08/07/2023   HGB 13.3 08/07/2023   HCT 40.4  08/07/2023   PLT 249.0 08/07/2023   GLUCOSE 134 (H) 02/27/2023   CHOL 203 (H) 02/27/2023   TRIG 49.0 02/27/2023   HDL 86.50 02/27/2023   LDLCALC 107 (H) 02/27/2023   ALT 12 08/07/2023   AST 20 08/07/2023   NA 139 02/27/2023   K 4.1 06/08/2023   CL 103 02/27/2023   CREATININE 0.7 06/08/2023   BUN 22 (A) 06/08/2023   CO2 28 02/27/2023   TSH 2.98 08/07/2023   HGBA1C 7.5 06/08/2023   MICROALBUR <0.7 08/07/2023    DG BONE DENSITY (DXA) Result Date: 06/05/2023 EXAM: DUAL X-RAY ABSORPTIOMETRY (DXA) FOR BONE MINERAL DENSITY IMPRESSION: Referring Physician:  DEBBY LITTIE MOLT Your patient completed a bone mineral density test using GE Lunar iDXA system (analysis version: 16). Technologist: BEC PATIENT: Name: Catherine Murphy, Catherine Murphy Patient ID: [id number] Birth Date: January 07, 1956 Height: 62.5 in. Sex: Female Measured: 06/05/2023 Weight: 149.2 lbs. Indications: Estrogen Deficient, Insulin  for Diabetes, Postmenopausal Fractures: NONE Treatments: None ASSESSMENT: The BMD measured at AP Spine L1-L2 is 0.860 g/cm2 with a T-score of -2.5. This patient is considered osteoporotic according to World Health Organization [facility]) criteria. L3 & L4 was excluded due to degenerative changes. The quality of the exam is good. Site Region Measured Date Measured Age YA BMD Significant CHANGE T-score AP Spine  L1-L2      7/05/2024    65.9         -2.5    0.860 g/cm2 DualFemur Neck Left  06/05/2023    65.9         -2.2    0.731 g/cm2 DualFemur Total Mean 7/05/2023    65.9          -1.6    0.810 g/cm2 World Health Organization [facility]) criteria for post-menopausal, Caucasian Women: Normal       T-score at or above -1 SD Osteopenia   T-score between -1 and -2.5 SD Osteoporosis T-score at or below -2.5 SD RECOMMENDATION: 1. All patients should optimize calcium and vitamin D intake. 2. Consider FDA-approved medical therapies in postmenopausal women and men aged 20 years and older, based on the following: a. A hip or vertebral (clinical or morphometric) fracture. b. T-score = -2.5 at the femoral neck or spine after appropriate evaluation to exclude secondary causes. c. Low bone mass (T-score between -1.0 and -2.5 at the femoral neck or spine) and a 10-year probability of a hip fracture = 3% or a 10-year probability of a major osteoporosis-related fracture = 20% based on the [place] -adapted WHO algorithm. d. Clinician judgment and/or patient preferences may indicate treatment for people with 10-year fracture probabilities above or below these levels. FOLLOW-UP: Patients with diagnosis of osteoporosis or at high risk for fracture should have regular bone mineral density tests.? Patients eligible for Medicare are allowed routine testing every 2 years.? The testing frequency can be increased to one year for patients who have rapidly progressing disease, are receiving or discontinuing medical therapy to restore bone mass, or have additional risk factors. I have reviewed this study and agree with the findings. [facility] Electronically Signed   By: Norman Hopper M.D.   On: 06/05/2023 16:13    Assessment & Plan:  Hyperlipidemia with target LDL less than 100- Will risk stratify with a CCS. -     Hepatic function panel; Future -     TSH; Future -     CT CARDIAC SCORING (SELF PAY ONLY); Future  Microalbuminuria due to type 2 diabetes mellitus (HCC) -  Urinalysis, Routine w reflex microscopic; Future -     Microalbumin / creatinine urine ratio; Future  Hypertension associated with  diabetes (HCC)- She has not achieved her BP goal. Will restart lozol . -     CBC with Differential/Platelet; Future -     Urinalysis, Routine w reflex microscopic; Future -     TSH; Future -     EKG 12-Lead -     AMB Referral VBCI Care Management -     CT CARDIAC SCORING (SELF PAY ONLY); Future -     Indapamide ; Take 1 tablet (1.25 mg total) by mouth daily.  Dispense: 90 tablet; Refill: 0  Insulin -requiring or dependent type II diabetes mellitus (HCC) -     Urinalysis, Routine w reflex microscopic; Future -     Microalbumin / creatinine urine ratio; Future -     HM Diabetes Foot Exam -     AMB Referral VBCI Care Management -     CT CARDIAC SCORING (SELF PAY ONLY); Future  Mild persistent asthma without complication -     Budesonide -Formoterol  Fumarate; Inhale 2 puffs into the lungs 2 (two) times daily.  Dispense: 3 each; Refill: 1 -     AMB Referral VBCI Care Management  Screening mammogram for breast cancer -     Digital Screening Mammogram, Left and Right; Future     Follow-up: Return in about 6 months (around 02/04/2024).  Debby Molt, MD

## 2023-08-07 NOTE — Patient Instructions (Signed)
 Hypertension, Adult High blood pressure (hypertension) is when the force of blood pumping through the arteries is too strong. The arteries are the blood vessels that carry blood from the heart throughout the body. Hypertension forces the heart to work harder to pump blood and may cause arteries to become narrow or stiff. Untreated or uncontrolled hypertension can lead to a heart attack, heart failure, a stroke, kidney disease, and other problems. A blood pressure reading consists of a higher number over a lower number. Ideally, your blood pressure should be below 120/80. The first ("top") number is called the systolic pressure. It is a measure of the pressure in your arteries as your heart beats. The second ("bottom") number is called the diastolic pressure. It is a measure of the pressure in your arteries as the heart relaxes. What are the causes? The exact cause of this condition is not known. There are some conditions that result in high blood pressure. What increases the risk? Certain factors may make you more likely to develop high blood pressure. Some of these risk factors are under your control, including: Smoking. Not getting enough exercise or physical activity. Being overweight. Having too much fat, sugar, calories, or salt (sodium) in your diet. Drinking too much alcohol. Other risk factors include: Having a personal history of heart disease, diabetes, high cholesterol, or kidney disease. Stress. Having a family history of high blood pressure and high cholesterol. Having obstructive sleep apnea. Age. The risk increases with age. What are the signs or symptoms? High blood pressure may not cause symptoms. Very high blood pressure (hypertensive crisis) may cause: Headache. Fast or irregular heartbeats (palpitations). Shortness of breath. Nosebleed. Nausea and vomiting. Vision changes. Severe chest pain, dizziness, and seizures. How is this diagnosed? This condition is diagnosed by  measuring your blood pressure while you are seated, with your arm resting on a flat surface, your legs uncrossed, and your feet flat on the floor. The cuff of the blood pressure monitor will be placed directly against the skin of your upper arm at the level of your heart. Blood pressure should be measured at least twice using the same arm. Certain conditions can cause a difference in blood pressure between your right and left arms. If you have a high blood pressure reading during one visit or you have normal blood pressure with other risk factors, you may be asked to: Return on a different day to have your blood pressure checked again. Monitor your blood pressure at home for 1 week or longer. If you are diagnosed with hypertension, you may have other blood or imaging tests to help your health care provider understand your overall risk for other conditions. How is this treated? This condition is treated by making healthy lifestyle changes, such as eating healthy foods, exercising more, and reducing your alcohol intake. You may be referred for counseling on a healthy diet and physical activity. Your health care provider may prescribe medicine if lifestyle changes are not enough to get your blood pressure under control and if: Your systolic blood pressure is above 130. Your diastolic blood pressure is above 80. Your personal target blood pressure may vary depending on your medical conditions, your age, and other factors. Follow these instructions at home: Eating and drinking  Eat a diet that is high in fiber and potassium, and low in sodium, added sugar, and fat. An example of this eating plan is called the DASH diet. DASH stands for Dietary Approaches to Stop Hypertension. To eat this way: Eat  plenty of fresh fruits and vegetables. Try to fill one half of your plate at each meal with fruits and vegetables. Eat whole grains, such as whole-wheat pasta, brown rice, or whole-grain bread. Fill about one  fourth of your plate with whole grains. Eat or drink low-fat dairy products, such as skim milk or low-fat yogurt. Avoid fatty cuts of meat, processed or cured meats, and poultry with skin. Fill about one fourth of your plate with lean proteins, such as fish, chicken without skin, beans, eggs, or tofu. Avoid pre-made and processed foods. These tend to be higher in sodium, added sugar, and fat. Reduce your daily sodium intake. Many people with hypertension should eat less than 1,500 mg of sodium a day. Do not drink alcohol if: Your health care provider tells you not to drink. You are pregnant, may be pregnant, or are planning to become pregnant. If you drink alcohol: Limit how much you have to: 0-1 drink a day for women. 0-2 drinks a day for men. Know how much alcohol is in your drink. In the [place] one drink equals one 12 oz bottle of beer (355 mL), one 5 oz glass of wine (148 mL), or one 1 oz glass of hard liquor (44 mL). Lifestyle  Work with your health care provider to maintain a healthy body weight or to lose weight. Ask what an ideal weight is for you. Get at least 30 minutes of exercise that causes your heart to beat faster (aerobic exercise) most days of the week. Activities may include walking, swimming, or biking. Include exercise to strengthen your muscles (resistance exercise), such as Pilates or lifting weights, as part of your weekly exercise routine. Try to do these types of exercises for 30 minutes at least 3 days a week. Do not use any products that contain nicotine or tobacco. These products include cigarettes, chewing tobacco, and vaping devices, such as e-cigarettes. If you need help quitting, ask your health care provider. Monitor your blood pressure at home as told by your health care provider. Keep all follow-up visits. This is important. Medicines Take over-the-counter and prescription medicines only as told by your health care provider. Follow directions carefully. Blood  pressure medicines must be taken as prescribed. Do not skip doses of blood pressure medicine. Doing this puts you at risk for problems and can make the medicine less effective. Ask your health care provider about side effects or reactions to medicines that you should watch for. Contact a health care provider if you: Think you are having a reaction to a medicine you are taking. Have headaches that keep coming back (recurring). Feel dizzy. Have swelling in your ankles. Have trouble with your vision. Get help right away if you: Develop a severe headache or confusion. Have unusual weakness or numbness. Feel faint. Have severe pain in your chest or abdomen. Vomit repeatedly. Have trouble breathing. These symptoms may be an emergency. Get help right away. Call 911. Do not wait to see if the symptoms will go away. Do not drive yourself to the hospital. Summary Hypertension is when the force of blood pumping through your arteries is too strong. If this condition is not controlled, it may put you at risk for serious complications. Your personal target blood pressure may vary depending on your medical conditions, your age, and other factors. For most people, a normal blood pressure is less than 120/80. Hypertension is treated with lifestyle changes, medicines, or a combination of both. Lifestyle changes include losing weight, eating a healthy,  low-sodium diet, exercising more, and limiting alcohol. This information is not intended to replace advice given to you by your health care provider. Make sure you discuss any questions you have with your health care provider. Document Revised: 05/25/2021 Document Reviewed: 05/25/2021 Elsevier Patient Education  2024 [employer].

## 2023-08-08 DIAGNOSIS — Z1231 Encounter for screening mammogram for malignant neoplasm of breast: Secondary | ICD-10-CM | POA: Insufficient documentation

## 2023-08-08 MED ORDER — INDAPAMIDE 1.25 MG PO TABS: 1.2500 mg | ORAL_TABLET | Freq: Every day | ORAL | 0 refills | Status: DC

## 2023-08-14 ENCOUNTER — Telehealth: Payer: Self-pay

## 2023-08-14 NOTE — Progress Notes (Signed)
 Care Guide Pharmacy Note  08/14/2023 Name: [name] MRN: [id number] DOB: 01/19/1956  Referred By: Joshua Debby CROME, MD Reason for referral: Care Coordination (Outreach to schedule with Pharm d )   Catherine Murphy is a 68 y.o. year old female who is a primary care patient of Joshua Debby CROME, MD.  Doc Demika Langenderfer was referred to the pharmacist for assistance related to: DMII  Successful contact was made with the patient to discuss pharmacy services.  Patient declines engagement at this time. Contact information was provided to the patient should they wish to reach out for assistance at a later time.  Jeoffrey Buffalo , RMA     [facility]  [facility], [facility] Guide  Direct Dial : [phone]  Website: [email or web address]

## 2023-08-14 NOTE — Telephone Encounter (Signed)
 Spoke with patient and her husband in regards to her testing. They gave a verbal understanding.

## 2023-08-14 NOTE — Telephone Encounter (Signed)
 Copied from CRM [id number]. Topic: Clinical - Request for Lab/Test Order >> Aug 14, 2023  9:33 AM Leila C wrote:  Reason for CRM: Patient's spouse Sheppard [phone] states CT cardiac scoring was ordered and why is there $150 waiver? Would like to speak to nurse to find out, please call back.

## 2023-08-15 ENCOUNTER — Emergency Department (HOSPITAL_COMMUNITY)
Admission: EM | Admit: 2023-08-15 | Discharge: 2023-08-16 | Discharge status: Against Medical Advice | Payer: Medicare Other | Attending: Emergency Medicine | Admitting: Emergency Medicine

## 2023-08-15 ENCOUNTER — Encounter (HOSPITAL_COMMUNITY): Payer: Self-pay

## 2023-08-15 ENCOUNTER — Emergency Department (HOSPITAL_COMMUNITY): Payer: Medicare Other

## 2023-08-15 ENCOUNTER — Other Ambulatory Visit: Payer: Self-pay

## 2023-08-15 ENCOUNTER — Ambulatory Visit
Admission: RE | Admit: 2023-08-15 | Discharge: 2023-08-15 | Disposition: A | Discharge status: Home / Self Care | Payer: Medicare Other | Source: Ambulatory Visit | Attending: Internal Medicine | Admitting: Internal Medicine

## 2023-08-15 DIAGNOSIS — R1030 Lower abdominal pain, unspecified: Secondary | ICD-10-CM | POA: Insufficient documentation

## 2023-08-15 DIAGNOSIS — R7309 Other abnormal glucose: Secondary | ICD-10-CM | POA: Diagnosis not present

## 2023-08-15 DIAGNOSIS — R1110 Vomiting, unspecified: Secondary | ICD-10-CM | POA: Insufficient documentation

## 2023-08-15 DIAGNOSIS — Z5321 Procedure and treatment not carried out due to patient leaving prior to being seen by health care provider: Secondary | ICD-10-CM | POA: Insufficient documentation

## 2023-08-15 DIAGNOSIS — Z1231 Encounter for screening mammogram for malignant neoplasm of breast: Secondary | ICD-10-CM

## 2023-08-15 LAB — URINALYSIS, ROUTINE W REFLEX MICROSCOPIC
Bilirubin Urine: NEGATIVE
Glucose, UA: NEGATIVE mg/dL
Hgb urine dipstick: NEGATIVE
Ketones, ur: 5 mg/dL — AB
Leukocytes,Ua: NEGATIVE
Nitrite: NEGATIVE
Protein, ur: 100 mg/dL — AB
Specific Gravity, Urine: 1.023 (ref 1.005–1.030)
pH: 5 (ref 5.0–8.0)

## 2023-08-15 LAB — COMPREHENSIVE METABOLIC PANEL
ALT: 15 U/L (ref 0–44)
AST: 24 U/L (ref 15–41)
Albumin: 3.9 g/dL (ref 3.5–5.0)
Alkaline Phosphatase: 39 U/L (ref 38–126)
Anion gap: 9 (ref 5–15)
BUN: 21 mg/dL (ref 8–23)
CO2: 27 mmol/L (ref 22–32)
Calcium: 9.4 mg/dL (ref 8.9–10.3)
Chloride: 103 mmol/L (ref 98–111)
Creatinine, Ser: 0.64 mg/dL (ref 0.44–1.00)
GFR, Estimated: >60 mL/min (ref >60)
Glucose, Bld: 139 mg/dL — ABNORMAL HIGH (ref 70–99)
Potassium: 3.4 mmol/L — ABNORMAL LOW (ref 3.5–5.1)
Sodium: 139 mmol/L (ref 135–145)
Total Bilirubin: 0.7 mg/dL (ref 0.0–1.2)
Total Protein: 7.1 g/dL (ref 6.5–8.1)

## 2023-08-15 LAB — CBC WITH DIFFERENTIAL/PLATELET
Abs Immature Granulocytes: 0.01 10*3/uL (ref 0.00–0.07)
Basophils Absolute: 0 10*3/uL (ref 0.0–0.1)
Basophils Relative: 1 %
Eosinophils Absolute: 0.1 10*3/uL (ref 0.0–0.5)
Eosinophils Relative: 1 %
HCT: 40.6 % (ref 36.0–46.0)
Hemoglobin: 12.9 g/dL (ref 12.0–15.0)
Immature Granulocytes: 0 %
Lymphocytes Relative: 36 %
Lymphs Abs: 2.1 10*3/uL (ref 0.7–4.0)
MCH: 29.7 pg (ref 26.0–34.0)
MCHC: 31.8 g/dL (ref 30.0–36.0)
MCV: 93.5 fL (ref 80.0–100.0)
Monocytes Absolute: 0.3 10*3/uL (ref 0.1–1.0)
Monocytes Relative: 5 %
Neutro Abs: 3.3 10*3/uL (ref 1.7–7.7)
Neutrophils Relative %: 57 %
Platelets: 209 10*3/uL (ref 150–400)
RBC: 4.34 MIL/uL (ref 3.87–5.11)
RDW: 12.3 % (ref 11.5–15.5)
WBC: 5.8 10*3/uL (ref 4.0–10.5)
nRBC: 0 % (ref 0.0–0.2)

## 2023-08-15 LAB — LIPASE, BLOOD: Lipase: 26 U/L (ref 11–51)

## 2023-08-15 LAB — CBG MONITORING, ED: Glucose-Capillary: 100 mg/dL — ABNORMAL HIGH (ref 70–99)

## 2023-08-15 MED ORDER — IOHEXOL 300 MG/ML  SOLN
100.0000 mL | Freq: Once | INTRAMUSCULAR | Status: AC | PRN
  Administered 2023-08-15: 100 mL via INTRAVENOUS

## 2023-08-15 MED ORDER — ONDANSETRON 4 MG PO TBDP
4.0000 mg | ORAL_TABLET | Freq: Once | ORAL | Status: AC
  Administered 2023-08-15: 4 mg via ORAL
  Filled 2023-08-15: qty 1

## 2023-08-15 NOTE — ED Triage Notes (Signed)
 Pt reports with sharp lower abdominal pain and vomiting since 4 pm.

## 2023-08-16 NOTE — ED Notes (Signed)
 Pt is thinking about leaving due to wait. Pt was informed if she decides to leave please let tech know so iv can be removed.

## 2023-08-16 NOTE — ED Notes (Addendum)
 Pt and visitor decided to leave without seeing the provider. Nurse asked pt and visitor to wait for provider to discharge pt. They refused and decided to have the IV removed so they can leave. IV removed, pt and visitor left without seeing provider.

## 2023-08-17 ENCOUNTER — Ambulatory Visit: Payer: Medicare Other | Admitting: Family

## 2023-08-17 ENCOUNTER — Encounter: Payer: Self-pay | Admitting: Family

## 2023-08-17 ENCOUNTER — Ambulatory Visit: Payer: Self-pay | Admitting: Internal Medicine

## 2023-08-17 VITALS — BP 122/64 | HR 85 | Temp 98.3°F | Ht 63.0 in | Wt 163.8 lb

## 2023-08-17 DIAGNOSIS — K529 Noninfective gastroenteritis and colitis, unspecified: Secondary | ICD-10-CM

## 2023-08-17 MED ORDER — AZITHROMYCIN 500 MG PO TABS: ORAL_TABLET | ORAL | 0 refills | Status: DC

## 2023-08-17 NOTE — Progress Notes (Signed)
Patient ID: Catherine Murphy, female    DOB: 02-07-1956, 68 y.o.   MRN: [id number]  Chief Complaint  Patient presents with   Abdominal Pain    Pt c/o lower abdominal pain, sweating and blood in bowel. Pt has not eaten in 12 hours but has been drinking fluids. Sx Present since 1/14.        Discussed the use of AI scribe software for clinical note transcription with the patient, who gave verbal consent to proceed.  History of Present Illness   The patient, with a history of diabetes and hemorrhoids, presents with new onset abdominal pain and bloody stool. The symptoms began after consuming a meal of fried chili and eggs. The abdominal pain was severe, described as 'terrible,' and was associated with difficulty breathing. Following the onset of pain, the patient had a bowel movement that was loose and spicy, causing a burning sensation on the skin. The following day, the patient noticed blood in her stool, and blood clot on 2 different occasions in the toilet bowl, but the water was not all bloody. The patient also reported passing gas provided some relief from the pain. The patient has a history of two surgeries for hemorrhoids, but denies any current hemorrhoid symptoms, and reports having a soft BM daily prior to this episode.. The patient also has diabetes, which is currently managed with insulin.     Assessment & Plan:     Colitis - Recent onset of abdominal pain, loose stools, and rectal bleeding following consumption of spicy food. CT scan showed mild colitis. No fever or signs of severe blood loss, ER labs wnl. History of hemorrhoids with two prior surgeries. -Sending Azithromycin 500mg  qd x3d, advised on use & SE. -Advised on bland, non-dairy, non-spicy, no fried foods diet for the next 5-7 days and increased fluid intake. -Refer to gastroenterology for further evaluation if symptoms do not improve.  Diabetes - Patient reports elevated blood sugars, possibly related to recent  illness. -Continue current diabetes management plan. -Advise patient to monitor blood sugars closely and adjust insulin as needed, report to PCP any continued elevated CBGs.     Subjective:    Outpatient Medications Prior to Visit  Medication Sig Dispense Refill   albuterol (VENTOLIN HFA) 108 (90 Base) MCG/ACT inhaler INHALE 2 PUFFS EVERY 6 HOURS AS NEEDED FOR WHEEZING OR SHORTNESS OF BREATH 8.5 each 4   Blood Glucose Monitoring Suppl (ONETOUCH VERIO) w/Device KIT Inject 1 Act into the skin 2 (two) times daily as needed. Use to monitor blood sugar twice a day. E11.9 1 kit 1   budesonide-formoterol (SYMBICORT) 80-4.5 MCG/ACT inhaler Inhale 2 puffs into the lungs 2 (two) times daily. 3 each 1   Continuous Blood Gluc Receiver (DEXCOM G7 RECEIVER) DEVI 1 Act by Does not apply route daily. 9 each 1   Continuous Blood Gluc Sensor (DEXCOM G6 SENSOR) MISC Change every 10 days 9 each 2   Continuous Blood Gluc Transmit (DEXCOM G6 TRANSMITTER) MISC Change every 3 months 1 each 2   famotidine (PEPCID) 40 MG tablet Take 1 tablet (40 mg total) by mouth at bedtime. 30 tablet 1   hydrOXYzine (ATARAX) 10 MG tablet Take 1 tablet (10 mg total) by mouth 3 (three) times daily as needed. 270 tablet 0   Insulin Pen Needle (B-D UF III MINI PEN NEEDLES) 31G X 5 MM MISC by Does not apply route.     ipratropium (ATROVENT) 0.03 % nasal spray USE 2 SPRAYS IN BOTH NOSTRILS  EVERY 12 HOURS 60 mL 3   Lancets (ONETOUCH DELICA PLUS LANCET33G) MISC USE TO CHECK BLOOD SUGAR TWICE A DAY. E11.9 100 each 5   losartan (COZAAR) 100 MG tablet TAKE 1 TABLET BY MOUTH DAILY 90 tablet 3   metFORMIN (GLUCOPHAGE-XR) 500 MG 24 hr tablet TAKE 2 TABLETS BY MOUTH DAILY  WITH BREAKFAST 180 tablet 1   montelukast (SINGULAIR) 10 MG tablet Take 1 tablet (10 mg total) by mouth at bedtime. 90 tablet 1   NOVOLOG 100 UNIT/ML injection INJECT SUBCUTANEOUSLY FOR USE IN PUMP, TOTAL OF 60 UNITS PER DAY 60 mL 1   ONETOUCH VERIO test strip USE 1 STRIP IN THE  MORNING, AT NOON, AND AT BEDTIME AS DIRECTED 300 strip 1   simvastatin (ZOCOR) 10 MG tablet TAKE 1 TABLET BY MOUTH AT  BEDTIME 90 tablet 1   indapamide (LOZOL) 1.25 MG tablet Take 1 tablet (1.25 mg total) by mouth daily. 90 tablet 0   No facility-administered medications prior to visit.   Past Medical History:  Diagnosis Date   Diabetes mellitus without complication (HCC)    Past Surgical History:  Procedure Laterality Date   CATARACT EXTRACTION Right    COLONOSCOPY     TRIGGER FINGER RELEASE     No Known Allergies    Objective:    Physical Exam Vitals and nursing note reviewed.  Constitutional:      Appearance: Normal appearance.  Cardiovascular:     Rate and Rhythm: Normal rate and regular rhythm.  Pulmonary:     Effort: Pulmonary effort is normal.     Breath sounds: Normal breath sounds.  Abdominal:     General: There is no distension.     Palpations: Abdomen is soft.     Tenderness: There is no abdominal tenderness (none with palpation).  Musculoskeletal:        General: Normal range of motion.  Skin:    General: Skin is warm and dry.  Neurological:     Mental Status: She is alert.  Psychiatric:        Mood and Affect: Mood normal.        Behavior: Behavior normal.    BP 122/64 (BP Location: Left Arm, Patient Position: Sitting, Cuff Size: Small)   Pulse 85   Temp 98.3 F (36.8 C) (Temporal)   Ht 5\' 3"  (1.6 m)   Wt 163 lb 12.8 oz (74.3 kg)   SpO2 96%   BMI 29.02 kg/m  Wt Readings from Last 3 Encounters:  08/17/23 163 lb 12.8 oz (74.3 kg)  08/15/23 146 lb (66.2 kg)  08/07/23 152 lb 13.6 oz (69.3 kg)      Dulce Sellar, NP

## 2023-08-17 NOTE — Patient Instructions (Addendum)
It was very nice to see you today!   I have sent over an antibiotic to your pharmacy, take as directed. Eat a mild, bland diet for the next few days, and be sure to drink at least 2 liters of fluids daily, mostly water.  Call the office next week if your symptoms persist.     PLEASE NOTE:  If you had any lab tests please let us know if you have not heard back within a few days. You may see your results on MyChart before we have a chance to review them but we will give you a call once they are reviewed by [place]. If we ordered any referrals today, please let us know if you have not heard from their office within the next week.

## 2023-08-17 NOTE — Telephone Encounter (Signed)
Copied from CRM [id number]. Topic: Clinical - Red Word Triage >> Aug 17, 2023  8:46 AM Truddie Crumble wrote: Red Word that prompted transfer to Nurse Triage: blood in stool since Tuesday  Chief Complaint: blood in bowel movement Symptoms: blood in bowel movement Right and left Lower 4/10 Abd pain  Frequency: yesterday Pertinent Negatives: Patient denies 4/10 Disposition: [] ED /[] Urgent Care (no appt availability in office) / [x] Appointment(In office/virtual)/ []  [facility]/ [] Home Care/ [] Refused Recommended Disposition /[] [facility]/ []  Follow-up with PCP Additional Notes: appt made with provider at another clinic  Reason for Disposition  MILD rectal bleeding (more than just a few drops or streaks)  Answer Assessment - Initial Assessment Questions 1. APPEARANCE of BLOOD: "What color is it?" "Is it passed separately, on the surface of the stool, or mixed in with the stool?"      Light red blood with clots 2. AMOUNT: "How much blood was passed?"      On tissue and some toilet 3. FREQUENCY: "How many times has blood been passed with the stools?"      2 4. ONSET: "When was the blood first seen in the stools?" (Days or weeks)      yesterday 5. DIARRHEA: "Is there also some diarrhea?" If Yes, ask: "How many diarrhea stools in the past 24 hours?"      none 6. CONSTIPATION: "Do you have constipation?" If Yes, ask: "How bad is it?"     None regular movement 7. RECURRENT SYMPTOMS: "Have you had blood in your stools before?" If Yes, ask: "When was the last time?" and "What happened that time?"      no 8. BLOOD THINNERS: "Do you take any blood thinners?" (e.g., Coumadin/warfarin, Pradaxa/dabigatran, aspirin)     no 9. OTHER SYMPTOMS: "Do you have any other symptoms?"  (e.g., abdomen pain, vomiting, dizziness, fever)     Right and left Lower 4/10 Abd pain  10. PREGNANCY: "Is there any chance you are pregnant?" "When was your last menstrual period?"       no  Protocols used:  Rectal Bleeding-A-AH

## 2023-08-24 ENCOUNTER — Telehealth: Payer: Self-pay

## 2023-08-24 ENCOUNTER — Other Ambulatory Visit: Payer: Self-pay | Admitting: Internal Medicine

## 2023-08-24 DIAGNOSIS — J4520 Mild intermittent asthma, uncomplicated: Secondary | ICD-10-CM

## 2023-08-24 MED ORDER — FLUTICASONE FUROATE-VILANTEROL 100-25 MCG/ACT IN AEPB: 1.0000 | INHALATION_SPRAY | Freq: Every day | RESPIRATORY_TRACT | 0 refills | Status: DC

## 2023-08-24 NOTE — Telephone Encounter (Signed)
Patients insurance will not cover the Symbicort in haler that you recently sent in for patient. They will cover Breo Ellipta inhaler. Please advise if you would like a PA done or if you're going to send what's covered in.

## 2023-10-07 ENCOUNTER — Other Ambulatory Visit: Payer: Self-pay | Admitting: Internal Medicine

## 2023-10-07 DIAGNOSIS — J4520 Mild intermittent asthma, uncomplicated: Secondary | ICD-10-CM

## 2023-11-12 ENCOUNTER — Other Ambulatory Visit: Payer: Self-pay | Admitting: Internal Medicine

## 2023-11-12 DIAGNOSIS — Z794 Long term (current) use of insulin: Secondary | ICD-10-CM

## 2023-11-12 DIAGNOSIS — E119 Type 2 diabetes mellitus without complications: Secondary | ICD-10-CM

## 2023-11-12 DIAGNOSIS — E785 Hyperlipidemia, unspecified: Secondary | ICD-10-CM

## 2023-11-12 DIAGNOSIS — E118 Type 2 diabetes mellitus with unspecified complications: Secondary | ICD-10-CM

## 2023-12-06 ENCOUNTER — Other Ambulatory Visit: Payer: Self-pay | Admitting: Internal Medicine

## 2023-12-06 DIAGNOSIS — E119 Type 2 diabetes mellitus without complications: Secondary | ICD-10-CM

## 2023-12-06 DIAGNOSIS — E118 Type 2 diabetes mellitus with unspecified complications: Secondary | ICD-10-CM

## 2023-12-06 DIAGNOSIS — E785 Hyperlipidemia, unspecified: Secondary | ICD-10-CM

## 2023-12-06 NOTE — Telephone Encounter (Signed)
 Last Fill: Metformin : 04/21/23     Simvastatin : 04/21/23  Last OV: 08/07/23 Next OV: 01/09/24  Routing to provider for review/authorization.

## 2023-12-06 NOTE — Telephone Encounter (Signed)
 Copied from CRM [id number]. Topic: Clinical - Medication Refill >> Dec 06, 2023  3:48 PM Juleen Oakland F wrote: Medication: metFORMIN   simvastatin   Has the patient contacted their pharmacy? Yes (Agent: If no, request that the patient contact the pharmacy for the refill. If patient does not wish to contact the pharmacy document the reason why and proceed with request.) (Agent: If yes, when and what did the pharmacy advise?)  This is the patient's preferred pharmacy:    [facility] [place] [place] - [address] [place] [address] Phone: [phone] Fax: [phone]  Is this the correct pharmacy for this prescription? Yes If no, delete pharmacy and type the correct one.   Has the prescription been filled recently? Yes  Is the patient out of the medication? Yes  Has the patient been seen for an appointment in the last year OR does the patient have an upcoming appointment? Yes  Can we respond through MyChart? Yes  Agent: Please be advised that Rx refills may take up to 3 business days. We ask that you follow-up with your pharmacy.

## 2023-12-12 ENCOUNTER — Other Ambulatory Visit: Payer: Self-pay

## 2023-12-24 ENCOUNTER — Other Ambulatory Visit: Payer: Self-pay | Admitting: Internal Medicine

## 2023-12-24 DIAGNOSIS — E1065 Type 1 diabetes mellitus with hyperglycemia: Secondary | ICD-10-CM

## 2023-12-28 LAB — BASIC METABOLIC PANEL WITH GFR
BUN: 21 (ref 4–21)
Creatinine: 0.6 (ref 0.5–1.1)
Glucose: 218
Potassium: 4.1 meq/L (ref 3.5–5.1)

## 2023-12-28 LAB — HEMOGLOBIN A1C: Hemoglobin A1C: 7.4

## 2023-12-28 LAB — COMPREHENSIVE METABOLIC PANEL WITH GFR: eGFR: 72

## 2024-01-02 LAB — HM DIABETES EYE EXAM

## 2024-01-03 ENCOUNTER — Encounter: Payer: Self-pay | Admitting: Internal Medicine

## 2024-01-09 ENCOUNTER — Ambulatory Visit: Admitting: Internal Medicine

## 2024-01-12 ENCOUNTER — Ambulatory Visit (INDEPENDENT_AMBULATORY_CARE_PROVIDER_SITE_OTHER): Admitting: Internal Medicine

## 2024-01-12 ENCOUNTER — Encounter: Payer: Self-pay | Admitting: Internal Medicine

## 2024-01-12 DIAGNOSIS — E118 Type 2 diabetes mellitus with unspecified complications: Secondary | ICD-10-CM | POA: Diagnosis not present

## 2024-01-12 DIAGNOSIS — I10 Essential (primary) hypertension: Secondary | ICD-10-CM | POA: Diagnosis not present

## 2024-01-12 DIAGNOSIS — E1129 Type 2 diabetes mellitus with other diabetic kidney complication: Secondary | ICD-10-CM | POA: Diagnosis not present

## 2024-01-12 DIAGNOSIS — M8580 Other specified disorders of bone density and structure, unspecified site: Secondary | ICD-10-CM | POA: Insufficient documentation

## 2024-01-12 DIAGNOSIS — E785 Hyperlipidemia, unspecified: Secondary | ICD-10-CM | POA: Diagnosis not present

## 2024-01-12 DIAGNOSIS — R809 Proteinuria, unspecified: Secondary | ICD-10-CM

## 2024-01-12 DIAGNOSIS — Z7984 Long term (current) use of oral hypoglycemic drugs: Secondary | ICD-10-CM

## 2024-01-12 MED ORDER — METFORMIN HCL ER 500 MG PO TB24: 1000.0000 mg | ORAL_TABLET | Freq: Every day | ORAL | 1 refills | Status: AC

## 2024-01-12 MED ORDER — LOSARTAN POTASSIUM 100 MG PO TABS: 100.0000 mg | ORAL_TABLET | Freq: Every day | ORAL | 1 refills | Status: DC

## 2024-01-12 MED ORDER — SIMVASTATIN 10 MG PO TABS: 10.0000 mg | ORAL_TABLET | Freq: Every day | ORAL | 1 refills | Status: AC

## 2024-01-12 NOTE — Patient Instructions (Signed)
 Hypertension, Adult High blood pressure (hypertension) is when the force of blood pumping through the arteries is too strong. The arteries are the blood vessels that carry blood from the heart throughout the body. Hypertension forces the heart to work harder to pump blood and may cause arteries to become narrow or stiff. Untreated or uncontrolled hypertension can lead to a heart attack, heart failure, a stroke, kidney disease, and other problems. A blood pressure reading consists of a higher number over a lower number. Ideally, your blood pressure should be below 120/80. The first ("top") number is called the systolic pressure. It is a measure of the pressure in your arteries as your heart beats. The second ("bottom") number is called the diastolic pressure. It is a measure of the pressure in your arteries as the heart relaxes. What are the causes? The exact cause of this condition is not known. There are some conditions that result in high blood pressure. What increases the risk? Certain factors may make you more likely to develop high blood pressure. Some of these risk factors are under your control, including: Smoking. Not getting enough exercise or physical activity. Being overweight. Having too much fat, sugar, calories, or salt (sodium) in your diet. Drinking too much alcohol. Other risk factors include: Having a personal history of heart disease, diabetes, high cholesterol, or kidney disease. Stress. Having a family history of high blood pressure and high cholesterol. Having obstructive sleep apnea. Age. The risk increases with age. What are the signs or symptoms? High blood pressure may not cause symptoms. Very high blood pressure (hypertensive crisis) may cause: Headache. Fast or irregular heartbeats (palpitations). Shortness of breath. Nosebleed. Nausea and vomiting. Vision changes. Severe chest pain, dizziness, and seizures. How is this diagnosed? This condition is diagnosed by  measuring your blood pressure while you are seated, with your arm resting on a flat surface, your legs uncrossed, and your feet flat on the floor. The cuff of the blood pressure monitor will be placed directly against the skin of your upper arm at the level of your heart. Blood pressure should be measured at least twice using the same arm. Certain conditions can cause a difference in blood pressure between your right and left arms. If you have a high blood pressure reading during one visit or you have normal blood pressure with other risk factors, you may be asked to: Return on a different day to have your blood pressure checked again. Monitor your blood pressure at home for 1 week or longer. If you are diagnosed with hypertension, you may have other blood or imaging tests to help your health care provider understand your overall risk for other conditions. How is this treated? This condition is treated by making healthy lifestyle changes, such as eating healthy foods, exercising more, and reducing your alcohol intake. You may be referred for counseling on a healthy diet and physical activity. Your health care provider may prescribe medicine if lifestyle changes are not enough to get your blood pressure under control and if: Your systolic blood pressure is above 130. Your diastolic blood pressure is above 80. Your personal target blood pressure may vary depending on your medical conditions, your age, and other factors. Follow these instructions at home: Eating and drinking  Eat a diet that is high in fiber and potassium, and low in sodium, added sugar, and fat. An example of this eating plan is called the DASH diet. DASH stands for Dietary Approaches to Stop Hypertension. To eat this way: Eat  plenty of fresh fruits and vegetables. Try to fill one half of your plate at each meal with fruits and vegetables. Eat whole grains, such as whole-wheat pasta, brown rice, or whole-grain bread. Fill about one  fourth of your plate with whole grains. Eat or drink low-fat dairy products, such as skim milk or low-fat yogurt. Avoid fatty cuts of meat, processed or cured meats, and poultry with skin. Fill about one fourth of your plate with lean proteins, such as fish, chicken without skin, beans, eggs, or tofu. Avoid pre-made and processed foods. These tend to be higher in sodium, added sugar, and fat. Reduce your daily sodium intake. Many people with hypertension should eat less than 1,500 mg of sodium a day. Do not drink alcohol if: Your health care provider tells you not to drink. You are pregnant, may be pregnant, or are planning to become pregnant. If you drink alcohol: Limit how much you have to: 0-1 drink a day for women. 0-2 drinks a day for men. Know how much alcohol is in your drink. In the [place] one drink equals one 12 oz bottle of beer (355 mL), one 5 oz glass of wine (148 mL), or one 1 oz glass of hard liquor (44 mL). Lifestyle  Work with your health care provider to maintain a healthy body weight or to lose weight. Ask what an ideal weight is for you. Get at least 30 minutes of exercise that causes your heart to beat faster (aerobic exercise) most days of the week. Activities may include walking, swimming, or biking. Include exercise to strengthen your muscles (resistance exercise), such as Pilates or lifting weights, as part of your weekly exercise routine. Try to do these types of exercises for 30 minutes at least 3 days a week. Do not use any products that contain nicotine or tobacco. These products include cigarettes, chewing tobacco, and vaping devices, such as e-cigarettes. If you need help quitting, ask your health care provider. Monitor your blood pressure at home as told by your health care provider. Keep all follow-up visits. This is important. Medicines Take over-the-counter and prescription medicines only as told by your health care provider. Follow directions carefully. Blood  pressure medicines must be taken as prescribed. Do not skip doses of blood pressure medicine. Doing this puts you at risk for problems and can make the medicine less effective. Ask your health care provider about side effects or reactions to medicines that you should watch for. Contact a health care provider if you: Think you are having a reaction to a medicine you are taking. Have headaches that keep coming back (recurring). Feel dizzy. Have swelling in your ankles. Have trouble with your vision. Get help right away if you: Develop a severe headache or confusion. Have unusual weakness or numbness. Feel faint. Have severe pain in your chest or abdomen. Vomit repeatedly. Have trouble breathing. These symptoms may be an emergency. Get help right away. Call 911. Do not wait to see if the symptoms will go away. Do not drive yourself to the hospital. Summary Hypertension is when the force of blood pumping through your arteries is too strong. If this condition is not controlled, it may put you at risk for serious complications. Your personal target blood pressure may vary depending on your medical conditions, your age, and other factors. For most people, a normal blood pressure is less than 120/80. Hypertension is treated with lifestyle changes, medicines, or a combination of both. Lifestyle changes include losing weight, eating a healthy,  low-sodium diet, exercising more, and limiting alcohol. This information is not intended to replace advice given to you by your health care provider. Make sure you discuss any questions you have with your health care provider. Document Revised: 05/25/2021 Document Reviewed: 05/25/2021 Elsevier Patient Education  2024 [employer].

## 2024-01-12 NOTE — Progress Notes (Unsigned)
 Subjective:  Patient ID: Catherine Murphy, female    DOB: [birth year]  Age: 68 y.o. MRN: [id number]  CC: Hypertension, Hyperlipidemia, and Diabetes   HPI [name] presents for f/up ----  Discussed the use of AI scribe software for clinical note transcription with the patient, who gave verbal consent to proceed.  History of Present Illness   Catherine Murphy is a 68 year old female with hypertension who presents for a routine follow-up.  Her blood pressure readings have been consistently under 120 mmHg. She checks her blood pressure every night and has not experienced any issues with high blood pressure. No chest pain, shortness of breath, dizziness, or lightheadedness during physical activity.  She had an eye exam this year, which revealed some blood in both eyes. No treatment was recommended, and it was suggested that this might be related to her blood pressure or blood sugar levels.  She exercises regularly, using three-pound weights up to five times a week, and feels good during these activities.  Recent lab work, including an A1c test, showed a level of 7.5. Her diabetic doctor conducted lab work in May.       Outpatient Medications Prior to Visit  Medication Sig Dispense Refill   albuterol  (VENTOLIN  HFA) 108 (90 Base) MCG/ACT inhaler INHALE 2 PUFFS EVERY 6 HOURS AS NEEDED FOR WHEEZING OR SHORTNESS OF BREATH 8.5 each 4   Blood Glucose Monitoring Suppl (ONETOUCH VERIO) w/Device KIT Inject 1 Act into the skin 2 (two) times daily as needed. Use to monitor blood sugar twice a day. E11.9 1 kit 1   Continuous Blood Gluc Receiver (DEXCOM G7 RECEIVER) DEVI 1 Act by Does not apply route daily. 9 each 1   Continuous Blood Gluc Sensor (DEXCOM G6 SENSOR) MISC Change every 10 days 9 each 2   Continuous Blood Gluc Transmit (DEXCOM G6 TRANSMITTER) MISC Change every 3 months 1 each 2   fluticasone  furoate-vilanterol (BREO ELLIPTA ) 100-25 MCG/ACT AEPB USE 1 INHALATION BY MOUTH DAILY  120 each 0   hydrOXYzine  (ATARAX ) 10 MG tablet Take 1 tablet (10 mg total) by mouth 3 (three) times daily as needed. 270 tablet 0   Insulin  Pen Needle (B-D UF III MINI PEN NEEDLES) 31G X 5 MM MISC by Does not apply route.     ipratropium (ATROVENT ) 0.03 % nasal spray USE 2 SPRAYS IN BOTH NOSTRILS  EVERY 12 HOURS 60 mL 3   Lancets (ONETOUCH DELICA PLUS LANCET33G) MISC USE TO CHECK BLOOD SUGAR TWICE A DAY. E11.9 100 each 5   NOVOLOG  100 UNIT/ML injection INJECT SUBCUTANEOUSLY FOR USE IN PUMP, TOTAL OF 60 UNITS DAILY 60 mL 3   ONETOUCH VERIO test strip USE 1 STRIP IN THE MORNING, AT NOON, AND AT BEDTIME AS DIRECTED 300 strip 1   losartan  (COZAAR ) 100 MG tablet TAKE 1 TABLET BY MOUTH DAILY 90 tablet 3   metFORMIN  (GLUCOPHAGE -XR) 500 MG 24 hr tablet TAKE 2 TABLETS BY MOUTH DAILY  WITH BREAKFAST 180 tablet 3   simvastatin  (ZOCOR ) 10 MG tablet TAKE 1 TABLET BY MOUTH AT  BEDTIME 90 tablet 3   azithromycin  (ZITHROMAX ) 500 MG tablet Take 1 tablet daily after eating. 3 tablet 0   famotidine  (PEPCID ) 40 MG tablet Take 1 tablet (40 mg total) by mouth at bedtime. 30 tablet 1   montelukast  (SINGULAIR ) 10 MG tablet Take 1 tablet (10 mg total) by mouth at bedtime. 90 tablet 1   No facility-administered medications prior to visit.  ROS Review of Systems  Objective:  BP 138/72 (BP Location: Left Arm, Patient Position: Sitting, Cuff Size: Normal)   Pulse 78   Temp 99.1 F (37.3 C) (Oral)   Resp 16   Ht 5' 3 (1.6 m)   Wt 151 lb 3.2 oz (68.6 kg)   SpO2 95%   BMI 26.78 kg/m   BP Readings from Last 3 Encounters:  01/12/24 138/72  08/17/23 122/64  08/16/23 (!) 117/55    Wt Readings from Last 3 Encounters:  01/12/24 151 lb 3.2 oz (68.6 kg)  08/17/23 163 lb 12.8 oz (74.3 kg)  08/15/23 146 lb (66.2 kg)    Physical Exam  Lab Results  Component Value Date   WBC 5.8 08/15/2023   HGB 12.9 08/15/2023   HCT 40.6 08/15/2023   PLT 209 08/15/2023   GLUCOSE 139 (H) 08/15/2023   CHOL 203 (H)  02/27/2023   TRIG 49.0 02/27/2023   HDL 86.50 02/27/2023   LDLCALC 107 (H) 02/27/2023   ALT 15 08/15/2023   AST 24 08/15/2023   NA 139 08/15/2023   K 4.1 12/28/2023   CL 103 08/15/2023   CREATININE 0.6 12/28/2023   BUN 21 12/28/2023   CO2 27 08/15/2023   TSH 2.98 08/07/2023   HGBA1C 7.4 12/28/2023   MICROALBUR <0.7 08/07/2023    MM 3D SCREENING MAMMOGRAM BILATERAL BREAST Result Date: 08/16/2023 CLINICAL DATA:  Screening. EXAM: DIGITAL SCREENING BILATERAL MAMMOGRAM WITH TOMOSYNTHESIS AND CAD TECHNIQUE: Bilateral screening digital craniocaudal and mediolateral oblique mammograms were obtained. Bilateral screening digital breast tomosynthesis was performed. The images were evaluated with computer-aided detection. COMPARISON:  Previous exam(s). ACR Breast Density Category c: The breasts are heterogeneously dense, which may obscure small masses. FINDINGS: There are no findings suspicious for malignancy. IMPRESSION: No mammographic evidence of malignancy. A result letter of this screening mammogram will be mailed directly to the patient. RECOMMENDATION: Screening mammogram in one year. (Code:SM-B-01Y) BI-RADS CATEGORY  1: Negative. Electronically Signed   By: Sande Cromer M.D.   On: 08/16/2023 11:19   CT ABDOMEN PELVIS W CONTRAST Result Date: 08/15/2023 CLINICAL DATA:  Lower abdominal pain. EXAM: CT ABDOMEN AND PELVIS WITH CONTRAST TECHNIQUE: Multidetector CT imaging of the abdomen and pelvis was performed using the standard protocol following bolus administration of intravenous contrast. RADIATION DOSE REDUCTION: This exam was performed according to the departmental dose-optimization program which includes automated exposure control, adjustment of the mA and/or kV according to patient size and/or use of iterative reconstruction technique. CONTRAST:  OMNIPAQUE  IOHEXOL  300 MG/ML  SOLN COMPARISON:  None Available. FINDINGS: Lower chest: No acute abnormality. Hepatobiliary: No focal liver  abnormality is seen. No gallstones, gallbladder wall thickening, or biliary dilatation. Pancreas: Unremarkable. No pancreatic ductal dilatation or surrounding inflammatory changes. Spleen: Normal in size without focal abnormality. Adrenals/Urinary Tract: Adrenal glands are unremarkable. Kidneys are normal in size, without renal calculi or hydronephrosis. A 1.8 cm exophytic simple cyst is seen within the anteromedial aspect of the mid left kidney. Bladder is unremarkable. Stomach/Bowel: Stomach is within normal limits. Appendix appears normal. No evidence of bowel dilatation. A short segment of poorly distended and mildly thickened proximal sigmoid colon is noted (best seen on coronal reformatted images 27 through 38, CT series 7). Vascular/Lymphatic: No significant vascular findings are present. No enlarged abdominal or pelvic lymph nodes. Reproductive: Uterus and bilateral adnexa are unremarkable. Other: No abdominal wall hernia or abnormality. No abdominopelvic ascites. Musculoskeletal: No acute or significant osseous findings. IMPRESSION: 1. Mildly thickened and poorly distended proximal  sigmoid colon, which may represent mild sigmoid colitis. 2. 1.8 cm simple left renal cyst. No follow-up imaging is recommended. This recommendation follows ACR consensus guidelines: Management of the Incidental Renal Mass on CT: A White Paper of the ACR Incidental Findings Committee. J Am Coll Radiol [phone]. Electronically Signed   By: Virgle Grime M.D.   On: 08/15/2023 20:06    Assessment & Plan:  Microalbuminuria due to type 2 diabetes mellitus (HCC) -     Losartan  Potassium; Take 1 tablet (100 mg total) by mouth daily.  Dispense: 90 tablet; Refill: 1  Hypertension, unspecified type -     Losartan  Potassium; Take 1 tablet (100 mg total) by mouth daily.  Dispense: 90 tablet; Refill: 1  Type II diabetes mellitus with manifestations (HCC) -     metFORMIN  HCl ER; Take 2 tablets (1,000 mg total) by mouth  daily with breakfast.  Dispense: 180 tablet; Refill: 1  Hyperlipidemia with target LDL less than 100 -     Simvastatin ; Take 1 tablet (10 mg total) by mouth at bedtime.  Dispense: 90 tablet; Refill: 1 -     Lipid panel; Future     Follow-up: Return in about 6 months (around 07/13/2024).  Sandra Crouch, MD

## 2024-01-13 ENCOUNTER — Ambulatory Visit: Payer: Self-pay | Admitting: Internal Medicine

## 2024-01-13 LAB — LIPID PANEL
Cholesterol: 209 mg/dL — ABNORMAL HIGH (ref <200)
HDL: 98 mg/dL (ref >=50)
LDL Cholesterol (Calc): 97 mg/dL
Non-HDL Cholesterol (Calc): 111 mg/dL (ref <130)
Total CHOL/HDL Ratio: 2.1 (calc) (ref <5.0)
Triglycerides: 56 mg/dL (ref <150)

## 2024-02-05 ENCOUNTER — Ambulatory Visit: Payer: Medicare Other | Admitting: Internal Medicine

## 2024-04-08 ENCOUNTER — Other Ambulatory Visit: Payer: Self-pay | Admitting: Internal Medicine

## 2024-04-08 DIAGNOSIS — E119 Type 2 diabetes mellitus without complications: Secondary | ICD-10-CM

## 2024-04-08 DIAGNOSIS — E118 Type 2 diabetes mellitus with unspecified complications: Secondary | ICD-10-CM

## 2024-04-25 ENCOUNTER — Ambulatory Visit (INDEPENDENT_AMBULATORY_CARE_PROVIDER_SITE_OTHER)

## 2024-04-25 VITALS — Ht 63.0 in | Wt 151.0 lb

## 2024-04-25 DIAGNOSIS — Z0000 Encounter for general adult medical examination without abnormal findings: Secondary | ICD-10-CM | POA: Diagnosis not present

## 2024-04-25 NOTE — Patient Instructions (Signed)
 Catherine Murphy,  Thank you for taking the time for your Medicare Wellness Visit. I appreciate your continued commitment to your health goals. Please review the care plan we discussed, and feel free to reach out if I can assist you further.  Medicare recommends these wellness visits once per year to help you and your care team stay ahead of potential health issues. These visits are designed to focus on prevention, allowing your provider to concentrate on managing your acute and chronic conditions during your regular appointments.  Please note that Annual Wellness Visits do not include a physical exam. Some assessments may be limited, especially if the visit was conducted virtually. If needed, we may recommend a separate in-person follow-up with your provider.  Ongoing Care Seeing your primary care provider every 3 to 6 months helps us  monitor your health and provide consistent, personalized care. Next office visit on 07/11/2024.  Last on 01/12/2024.  You are due for a kidney evaluation, (urine sample) and get that done during your up coming office visit.  Keep up the good work.  Referrals If a referral was made during today's visit and you haven't received any updates within two weeks, please contact the referred provider directly to check on the status.  Recommended Screenings:  Health Maintenance  Topic Date Due   Yearly kidney health urinalysis for diabetes  Never done   COVID-19 Vaccine (8 - 2025-26 season) 04/01/2024   Hemoglobin A1C  06/29/2024   Complete foot exam   08/06/2024   Yearly kidney function blood test for diabetes  12/27/2024   Eye exam for diabetics  01/01/2025   Medicare Annual Wellness Visit  04/25/2025   Breast Cancer Screening  08/14/2025   Colon Cancer Screening  10/27/2029   DTaP/Tdap/Td vaccine (3 - Td or Tdap) 02/26/2033   Pneumococcal Vaccine for age over 55  Completed   Flu Shot  Completed   DEXA scan (bone density measurement)  Completed   Hepatitis C Screening   Completed   Zoster (Shingles) Vaccine  Completed   HPV Vaccine  Aged Out   Meningitis B Vaccine  Aged Out       04/25/2024    1:36 PM  Advanced Directives  Does Patient Have a Medical Advance Directive? No   Advance Care Planning is important because it: Ensures you receive medical care that aligns with your values, goals, and preferences. Provides guidance to your family and loved ones, reducing the emotional burden of decision-making during critical moments.  Vision: Annual vision screenings are recommended for early detection of glaucoma, cataracts, and diabetic retinopathy. These exams can also reveal signs of chronic conditions such as diabetes and high blood pressure.  Dental: Annual dental screenings help detect early signs of oral cancer, gum disease, and other conditions linked to overall health, including heart disease and diabetes.  Please see the attached documents for additional preventive care recommendations.

## 2024-04-25 NOTE — Progress Notes (Signed)
 Subjective:   Catherine Murphy is a 68 y.o. who presents for a Medicare Wellness preventive visit.  As a reminder, Annual Wellness Visits don't include a physical exam, and some assessments may be limited, especially if this visit is performed virtually. We may recommend an in-person follow-up visit with your provider if needed.  Visit Complete: Virtual I connected with  [name] on 04/25/24 by a audio enabled telemedicine application and verified that I am speaking with the correct person using two identifiers.  Patient Location: Home  Provider Location: Office/Clinic  I discussed the limitations of evaluation and management by telemedicine. The patient expressed understanding and agreed to proceed.  Vital Signs: Because this visit was a virtual/telehealth visit, some criteria may be missing or patient reported. Any vitals not documented were not able to be obtained and vitals that have been documented are patient reported.  VideoDeclined- This patient declined Librarian, academic. Therefore the visit was completed with audio only.  Persons Participating in Visit: Patient.  AWV Questionnaire: No: Patient Medicare AWV questionnaire was not completed prior to this visit.  Cardiac Risk Factors include: advanced age (>49men, >54 women);hypertension;diabetes mellitus;dyslipidemia     Objective:    Today's Vitals   04/25/24 1323  Weight: 151 lb (68.5 kg)  Height: 5' 3 (1.6 m)   Body mass index is 26.75 kg/m.     04/25/2024    1:36 PM 08/15/2023    4:41 PM 04/21/2023    1:56 PM 08/16/2022    3:46 PM  Advanced Directives  Does Patient Have a Medical Advance Directive? No No No No  Would patient like information on creating a medical advance directive?  No - Patient declined  Yes (MAU/Ambulatory/Procedural Areas - Information given)    Current Medications (verified) Outpatient Encounter Medications as of 04/25/2024  Medication Sig   albuterol   (VENTOLIN  HFA) 108 (90 Base) MCG/ACT inhaler INHALE 2 PUFFS EVERY 6 HOURS AS NEEDED FOR WHEEZING OR SHORTNESS OF BREATH   Blood Glucose Monitoring Suppl (ONETOUCH VERIO) w/Device KIT Inject 1 Act into the skin 2 (two) times daily as needed. Use to monitor blood sugar twice a day. E11.9   Continuous Blood Gluc Receiver (DEXCOM G7 RECEIVER) DEVI 1 Act by Does not apply route daily.   Continuous Blood Gluc Sensor (DEXCOM G6 SENSOR) MISC Change every 10 days   Continuous Blood Gluc Transmit (DEXCOM G6 TRANSMITTER) MISC Change every 3 months   fluticasone  furoate-vilanterol (BREO ELLIPTA ) 100-25 MCG/ACT AEPB USE 1 INHALATION BY MOUTH DAILY   hydrOXYzine  (ATARAX ) 10 MG tablet Take 1 tablet (10 mg total) by mouth 3 (three) times daily as needed.   Insulin  Pen Needle (B-D UF III MINI PEN NEEDLES) 31G X 5 MM MISC by Does not apply route.   ipratropium (ATROVENT ) 0.03 % nasal spray USE 2 SPRAYS IN BOTH NOSTRILS  EVERY 12 HOURS   Lancets (ONETOUCH DELICA PLUS LANCET33G) MISC USE AS DIRECTED TO CHECK BLOOD SUGAR TWICE A DAY   losartan  (COZAAR ) 100 MG tablet Take 1 tablet (100 mg total) by mouth daily.   metFORMIN  (GLUCOPHAGE -XR) 500 MG 24 hr tablet Take 2 tablets (1,000 mg total) by mouth daily with breakfast.   NOVOLOG  100 UNIT/ML injection INJECT SUBCUTANEOUSLY FOR USE IN PUMP, TOTAL OF 60 UNITS DAILY   ONETOUCH VERIO test strip USE 1 STRIP IN THE MORNING, AT NOON, AND AT BEDTIME AS DIRECTED   simvastatin  (ZOCOR ) 10 MG tablet Take 1 tablet (10 mg total) by mouth at  bedtime.   No facility-administered encounter medications on file as of 04/25/2024.    Allergies (verified) Patient has no known allergies.   History: Past Medical History:  Diagnosis Date   Diabetes mellitus without complication (HCC)    Past Surgical History:  Procedure Laterality Date   CATARACT EXTRACTION Right    COLONOSCOPY     TRIGGER FINGER RELEASE     Family History  Problem Relation Age of Onset   Ovarian cancer Mother 48    Glaucoma Father    Colon cancer Father    Diabetes Neg Hx    BRCA 1/2 Neg Hx    Breast cancer Neg Hx    Social History   Socioeconomic History   Marital status: Married    Spouse name: Not on file   Number of children: 2   Years of education: Not on file   Highest education level: Not on file  Occupational History   Occupation: Retired  Tobacco Use   Smoking status: Never   Smokeless tobacco: Never  Vaping Use   Vaping status: Never Used  Substance and Sexual Activity   Alcohol use: Not Currently   Drug use: Never   Sexual activity: Yes    Birth control/protection: Post-menopausal  Other Topics Concern   Not on file  Social History Narrative   Lives with husband/2025   Social Drivers of Health   Financial Resource Strain: Low Risk  (04/25/2024)   Overall Financial Resource Strain (CARDIA)    Difficulty of Paying Living Expenses: Not hard at all  Food Insecurity: No Food Insecurity (04/25/2024)   Hunger Vital Sign    Worried About Running Out of Food in the Last Year: Never true    Ran Out of Food in the Last Year: Never true  Transportation Needs: No Transportation Needs (04/25/2024)   PRAPARE - Administrator, Civil Service (Medical): No    Lack of Transportation (Non-Medical): No  Physical Activity: Sufficiently Active (04/25/2024)   Exercise Vital Sign    Days of Exercise per Week: 4 days    Minutes of Exercise per Session: 50 min  Stress: No Stress Concern Present (04/25/2024)   [employer] of Occupational Health - Occupational Stress Questionnaire    Feeling of Stress: Not at all  Social Connections: Moderately Isolated (04/25/2024)   Social Connection and Isolation Panel    Frequency of Communication with Friends and Family: More than three times a week    Frequency of Social Gatherings with Friends and Family: More than three times a week    Attends Religious Services: Never    Database administrator or Organizations: No    Attends Museum/gallery exhibitions officer: Never    Marital Status: Married    Tobacco Counseling Counseling given: Not Answered    Clinical Intake:  Pre-visit preparation completed: Yes  Pain : No/denies pain     BMI - recorded: 26.75 Nutritional Status: BMI 25 -29 Overweight Nutritional Risks: None Diabetes: Yes Did pt. bring in CBG monitor from home?: No  Lab Results  Component Value Date   HGBA1C 7.4 12/28/2023   HGBA1C 7.5 06/08/2023   HGBA1C 7.3 (H) 02/27/2023     How often do you need to have someone help you when you read instructions, pamphlets, or other written materials from your doctor or pharmacy?: 1 - Never  Interpreter Needed?: No  Information entered by :: Erven Ramson, RMA   Activities of Daily Living     04/25/2024  1:23 PM  In your present state of health, do you have any difficulty performing the following activities:  Hearing? 0  Vision? 0  Difficulty concentrating or making decisions? 0  Walking or climbing stairs? 0  Dressing or bathing? 0  Doing errands, shopping? 0  Preparing Food and eating ? N  Using the Toilet? N  In the past six months, have you accidently leaked urine? N  Do you have problems with loss of bowel control? N  Managing your Medications? N  Managing your Finances? N  Housekeeping or managing your Housekeeping? N    Patient Care Team: Joshua Debby CROME, MD as PCP - General (Internal Medicine) Patrcia Sharper, MD as Consulting Physician (Ophthalmology)  I have updated your Care Teams any recent Medical Services you may have received from other providers in the past year.     Assessment:   This is a routine wellness examination for Viney.  Hearing/Vision screen Hearing Screening - Comments:: Denies hearing difficulties   Vision Screening - Comments:: Wears eyeglasses/Dr. Patrcia   Goals Addressed               This Visit's Progress     Patient Stated (pt-stated)   On track     Want to stay healthy.        Depression Screen     04/25/2024    1:41 PM 01/12/2024   10:53 AM 07/13/2023    1:36 PM 04/21/2023    1:59 PM 08/29/2022   11:12 AM 04/28/2022   10:36 AM 12/17/2020   10:16 AM  PHQ 2/9 Scores  PHQ - 2 Score 0 0 0 0 0 0 0  PHQ- 9 Score 0   1  0     Fall Risk     04/25/2024    1:39 PM 01/12/2024   10:53 AM 07/13/2023    1:36 PM 04/21/2023    1:57 PM 08/29/2022   11:12 AM  Fall Risk   Falls in the past year? 0 0 0 0 0  Number falls in past yr: 0 0 0 0 0  Injury with Fall? 0 0 0 0 0  Risk for fall due to :  No Fall Risks No Fall Risks No Fall Risks No Fall Risks  Follow up Falls evaluation completed;Falls prevention discussed Falls evaluation completed Falls evaluation completed Falls evaluation completed;Falls prevention discussed Falls evaluation completed    MEDICARE RISK AT HOME:  Medicare Risk at Home Any stairs in or around the home?: No If so, are there any without handrails?: No Home free of loose throw rugs in walkways, pet beds, electrical cords, etc?: Yes Adequate lighting in your home to reduce risk of falls?: Yes Life alert?: No Use of a cane, walker or w/c?: No Grab bars in the bathroom?: No Shower chair or bench in shower?: No Elevated toilet seat or a handicapped toilet?: No  TIMED UP AND GO:  Was the test performed?  No  Cognitive Function: Declined/Normal: No cognitive concerns noted by patient or family. Patient alert, oriented, able to answer questions appropriately and recall recent events. No signs of memory loss or confusion.        04/21/2023    2:02 PM  6CIT Screen  What Year? 0 points  What month? 0 points  What time? 0 points  Count back from 20 0 points  Months in reverse 4 points  Repeat phrase 2 points  Total Score 6 points    Immunizations Immunization History  Administered  Date(s) Administered    sv, Bivalent, Protein Subunit Rsvpref,pf (Abrysvo) 03/30/2023   Fluad Quad(high Dose 65+) 08/10/2022   INFLUENZA, HIGH DOSE SEASONAL PF  08/11/2022, 02/27/2023, 02/27/2023   Influenza,inj,Quad PF,6+ Mos 05/27/2021, 04/28/2022   Influenza-Unspecified 03/27/2023   Moderna Sars-Covid-2 Vaccination 10/19/2019, 11/17/2019, 03/28/2020   PFIZER(Purple Top)SARS-COV-2 Vaccination 03/28/2020, 11/16/2020, 08/07/2021, 03/12/2022   PNEUMOCOCCAL CONJUGATE-20 03/24/2021   Tdap 02/27/2023, 02/27/2023   Zoster Recombinant(Shingrix) 03/24/2021, 06/29/2021    Screening Tests Health Maintenance  Topic Date Due   Diabetic kidney evaluation - Urine ACR  Never done   COVID-19 Vaccine (8 - 2025-26 season) 04/01/2024   HEMOGLOBIN A1C  06/29/2024   FOOT EXAM  08/06/2024   Diabetic kidney evaluation - eGFR measurement  12/27/2024   OPHTHALMOLOGY EXAM  01/01/2025   Medicare Annual Wellness (AWV)  04/25/2025   Mammogram  08/14/2025   Colonoscopy  10/27/2029   DTaP/Tdap/Td (3 - Td or Tdap) 02/26/2033   Pneumococcal Vaccine: 50+ Years  Completed   Influenza Vaccine  Completed   DEXA SCAN  Completed   Hepatitis C Screening  Completed   Zoster Vaccines- Shingrix  Completed   HPV VACCINES  Aged Out   Meningococcal B Vaccine  Aged Out    Health Maintenance Items Addressed: See Nurse Notes at the end of this note  Additional Screening:  Vision Screening: Recommended annual ophthalmology exams for early detection of glaucoma and other disorders of the eye. Is the patient up to date with their annual eye exam?  No  Who is the provider or what is the name of the office in which the patient attends annual eye exams? Dr. [facility]   Dental Screening: Recommended annual dental exams for proper oral hygiene  Community Resource Referral / Chronic Care Management: CRR required this visit?  No   CCM required this visit?  No   Plan:    I have personally reviewed and noted the following in the patient's chart:   Medical and social history Use of alcohol, tobacco or illicit drugs  Current medications and supplements  including opioid prescriptions. Patient is not currently taking opioid prescriptions. Functional ability and status Nutritional status Physical activity Advanced directives List of other physicians Hospitalizations, surgeries, and ER visits in previous 12 months Vitals Screenings to include cognitive, depression, and falls Referrals and appointments  In addition, I have reviewed and discussed with patient certain preventive protocols, quality metrics, and best practice recommendations. A written personalized care plan for preventive services as well as general preventive health recommendations were provided to patient.   Danine Hor L Deyra Perdomo, CMA   04/25/2024   After Visit Summary: (MyChart) Due to this being a telephonic visit, the after visit summary with patients personalized plan was offered to patient via MyChart   Notes: Patient is due for a UACR and order has been placed today.  She had no other concerns to address today.

## 2024-05-03 NOTE — Telephone Encounter (Signed)
 Error

## 2024-05-12 ENCOUNTER — Ambulatory Visit
Admission: RE | Admit: 2024-05-12 | Discharge: 2024-05-12 | Disposition: A | Discharge status: Home / Self Care | Source: Ambulatory Visit | Attending: Internal Medicine | Admitting: Internal Medicine

## 2024-05-12 ENCOUNTER — Other Ambulatory Visit: Payer: Self-pay | Admitting: Internal Medicine

## 2024-05-12 VITALS — BP 154/74 | HR 79 | Temp 98.3°F | Resp 20

## 2024-05-12 DIAGNOSIS — J029 Acute pharyngitis, unspecified: Secondary | ICD-10-CM | POA: Insufficient documentation

## 2024-05-12 LAB — POC SOFIA SARS ANTIGEN FIA: SARS Coronavirus 2 Ag: NEGATIVE

## 2024-05-12 LAB — POCT RAPID STREP A (OFFICE): Rapid Strep A Screen: NEGATIVE

## 2024-05-12 MED ORDER — LIDOCAINE VISCOUS HCL 2 % MT SOLN: 15.0000 mL | Freq: Four times a day (QID) | OROMUCOSAL | 0 refills | Status: AC | PRN

## 2024-05-12 NOTE — ED Triage Notes (Addendum)
 Pt states 3 days ago she was sneezing and had some congestion. then yesterday her throat on left side became sore. Denies cough.

## 2024-05-12 NOTE — ED Provider Notes (Signed)
 [facility]    CSN: [id number] Arrival date & time: 05/12/24  1055      History   Chief Complaint Chief Complaint  Patient presents with   Sore Throat    Entered by patient   Nasal Congestion    HPI Catherine Murphy is a 68 y.o. female.   Catherine Murphy is a 68 y.o. female presenting for chief complaint of sore throat, nasal congestion, sneezing, and sore swollen lymph that started 2 to 3 days ago on May 09, 2024.  Sore throat is worsened by swallowing.  Her husband has been sick with similar symptoms.  She denies fever, chills, nausea, vomiting, abdominal pain, rash, cough, shortness of breath, chest pain, heart palpitations, and recent antibiotic use.  She is taking Tylenol, dayquil, and ipatropium bromide nasal sprays with some relief.    Sore Throat    Past Medical History:  Diagnosis Date   Diabetes mellitus without complication [facility])     Patient Active Problem List   Diagnosis Date Noted   Osteopenia after menopause 01/12/2024   Screening mammogram for breast cancer 08/08/2023   Asthma 08/07/2023   Hyperglycemia due to type 1 diabetes mellitus (HCC) 08/07/2023   Laryngopharyngeal reflux (LPR) 07/13/2023   Seasonal allergic rhinitis due to pollen 10/27/2022   Estrogen deficiency 10/27/2022   Insulin -requiring or dependent type II diabetes mellitus (HCC) 08/25/2022   Hypertension associated with diabetes (HCC) 08/17/2022   Chronic renal disease, stage 2, mildly decreased glomerular filtration rate (GFR) between 60-89 mL/min/1.73 square meter 07/29/2022   Microalbuminuria due to type 2 diabetes mellitus (HCC) 07/29/2022   DDD (degenerative disc disease), lumbar 07/29/2022   Chronic bilateral low back pain with bilateral sciatica 07/28/2022   Mild persistent asthma without complication 04/28/2022   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) 11/08/2021   Nuclear sclerotic cataract of left eye 11/08/2021   Pseudophakia, right eye  11/08/2021   Epiretinal membrane 09/28/2021   Hyperlipidemia with target LDL less than 100 03/24/2021   Screening for cervical cancer 03/24/2021   Encounter for long-term (current) use of insulin  (HCC) 03/24/2021   Visit for screening mammogram 03/09/2021   Hypertension 12/17/2020   Type II diabetes mellitus with manifestations (HCC) 12/17/2020    Past Surgical History:  Procedure Laterality Date   CATARACT EXTRACTION Right    COLONOSCOPY     TRIGGER FINGER RELEASE      OB History     Gravida  5   Para  2   Term  2   Preterm  0   AB  3   Living  2      SAB  1   IAB  2   Ectopic  0   Multiple  0   Live Births  2        Obstetric Comments  2 vaginal births          Home Medications    Prior to Admission medications   Medication Sig Start Date End Date Taking? Authorizing Provider  lidocaine (XYLOCAINE) 2 % solution Use as directed 15 mLs in the mouth or throat every 6 (six) hours as needed for mouth pain. 05/12/24  Yes Enedelia Dorna HERO, FNP  albuterol  (VENTOLIN  HFA) 108 (90 Base) MCG/ACT inhaler INHALE 2 PUFFS EVERY 6 HOURS AS NEEDED FOR WHEEZING OR SHORTNESS OF BREATH 08/03/23   Joshua Debby CROME, MD  Blood Glucose Monitoring Suppl (ONETOUCH VERIO) w/Device KIT Inject 1 Act into the skin 2 (two) times daily  as needed. Use to monitor blood sugar twice a day. E11.9 02/23/22   Joshua Debby CROME, MD  Continuous Blood Gluc Receiver (DEXCOM G7 RECEIVER) DEVI 1 Act by Does not apply route daily. 05/26/22   Joshua Debby CROME, MD  Continuous Blood Gluc Sensor (DEXCOM G6 SENSOR) MISC Change every 10 days 05/30/22   [place], MD  Continuous Blood Gluc Transmit (DEXCOM G6 TRANSMITTER) MISC Change every 3 months 05/30/22   [place], MD  fluticasone  furoate-vilanterol (BREO ELLIPTA ) 100-25 MCG/ACT AEPB USE 1 INHALATION BY MOUTH DAILY 10/09/23   Joshua Debby CROME, MD  hydrOXYzine  (ATARAX ) 10 MG tablet Take 1 tablet (10 mg total) by mouth 3 (three) times daily as needed.  04/22/23   Joshua Debby CROME, MD  Insulin  Pen Needle (B-D UF III MINI PEN NEEDLES) 31G X 5 MM MISC by Does not apply route.    [provider]  ipratropium (ATROVENT ) 0.03 % nasal spray USE 2 SPRAYS IN BOTH NOSTRILS  EVERY 12 HOURS 05/10/23   Joshua Debby CROME, MD  Lancets [facility] DELICA PLUS LANCET33G) MISC USE AS DIRECTED TO CHECK BLOOD SUGAR TWICE A DAY 04/09/24   Joshua Debby CROME, MD  losartan  (COZAAR ) 100 MG tablet Take 1 tablet (100 mg total) by mouth daily. 01/12/24   Joshua Debby CROME, MD  metFORMIN  (GLUCOPHAGE -XR) 500 MG 24 hr tablet Take 2 tablets (1,000 mg total) by mouth daily with breakfast. 01/12/24   Joshua Debby CROME, MD  NOVOLOG  100 UNIT/ML injection INJECT SUBCUTANEOUSLY FOR USE IN PUMP, TOTAL OF 60 UNITS DAILY 12/27/23   Joshua Debby CROME, MD  [facility] VERIO test strip USE 1 STRIP IN THE MORNING, AT NOON, AND AT BEDTIME AS DIRECTED 04/09/24   Joshua Debby CROME, MD  simvastatin  (ZOCOR ) 10 MG tablet Take 1 tablet (10 mg total) by mouth at bedtime. 01/12/24   Joshua Debby CROME, MD    Family History Family History  Problem Relation Age of Onset   Ovarian cancer Mother 17   Glaucoma Father    Colon cancer Father    Diabetes Neg Hx    BRCA 1/2 Neg Hx    Breast cancer Neg Hx     Social History Social History   Tobacco Use   Smoking status: Never   Smokeless tobacco: Never  Vaping Use   Vaping status: Never Used  Substance Use Topics   Alcohol use: Not Currently   Drug use: Never     Allergies   Patient has no known allergies.   Review of Systems Review of Systems Per HPI  Physical Exam Triage Vital Signs ED Triage Vitals  Encounter Vitals Group     BP 05/12/24 1103 (!) 154/74     Girls Systolic BP Percentile --      Girls Diastolic BP Percentile --      Boys Systolic BP Percentile --      Boys Diastolic BP Percentile --      Pulse Rate 05/12/24 1103 79     Resp 05/12/24 1103 20     Temp 05/12/24 1103 98.3 F (36.8 C)     Temp Source 05/12/24 1103 Oral     SpO2  05/12/24 1103 94 %     Weight --      Height --      Head Circumference --      Peak Flow --      Pain Score 05/12/24 1105 8     Pain Loc --      Pain  Education --      Exclude from [employer] Chart --    No data found.  Updated Vital Signs BP (!) 154/74 (BP Location: Right Arm)   Pulse 79   Temp 98.3 F (36.8 C) (Oral)   Resp 20   SpO2 94%   Visual Acuity Right Eye Distance:   Left Eye Distance:   Bilateral Distance:    Right Eye Near:   Left Eye Near:    Bilateral Near:     Physical Exam Vitals and nursing note reviewed.  Constitutional:      Appearance: She is not ill-appearing or toxic-appearing.  HENT:     Head: Normocephalic and atraumatic.     Right Ear: Hearing, tympanic membrane, ear canal and external ear normal.     Left Ear: Hearing, tympanic membrane, ear canal and external ear normal.     Nose: Nose normal.     Mouth/Throat:     Lips: Pink.     Mouth: Mucous membranes are moist. No injury or oral lesions.     Dentition: Normal dentition.     Tongue: No lesions.     Pharynx: Oropharynx is clear. Uvula midline. No pharyngeal swelling, oropharyngeal exudate, posterior oropharyngeal erythema, uvula swelling or postnasal drip.     Tonsils: No tonsillar exudate.  Eyes:     General: Lids are normal. Vision grossly intact. Gaze aligned appropriately.     Extraocular Movements: Extraocular movements intact.     Conjunctiva/sclera: Conjunctivae normal.  Neck:     Trachea: Trachea and phonation normal.  Cardiovascular:     Rate and Rhythm: Normal rate and regular rhythm.     Heart sounds: Normal heart sounds, S1 normal and S2 normal.  Pulmonary:     Effort: Pulmonary effort is normal. No respiratory distress.     Breath sounds: Normal breath sounds and air entry.  Musculoskeletal:     Cervical back: Neck supple.  Lymphadenopathy:     Cervical: No cervical adenopathy.  Skin:    General: Skin is warm and dry.     Capillary Refill: Capillary refill takes  less than 2 seconds.     Findings: No rash.  Neurological:     General: No focal deficit present.     Mental Status: She is alert and oriented to person, place, and time. Mental status is at baseline.     Cranial Nerves: No dysarthria or facial asymmetry.  Psychiatric:        Mood and Affect: Mood normal.        Speech: Speech normal.        Behavior: Behavior normal.        Thought Content: Thought content normal.        Judgment: Judgment normal.      UC Treatments / Results  Labs (all labs ordered are listed, but only abnormal results are displayed) Labs Reviewed  POCT RAPID STREP A (OFFICE)  POC SOFIA SARS ANTIGEN FIA    EKG   Radiology No results found.  Procedures Procedures (including critical care time)  Medications Ordered in UC Medications - No data to display  Initial Impression / Assessment and Plan / UC Course  I have reviewed the triage vital signs and the nursing notes.  Pertinent labs & imaging results that were available during my care of the patient were reviewed by me and considered in my medical decision making (see chart for details).   1. Viral pharyngitis, sore throat Evaluation suggests viral pharyngitis etiology.  Group A strep POC testing negative, throat culture pending, will treat based on throat culture results for bacterial pharyngitis if indicated. Patient is requesting an antibiotic today, we discussed appropriate use of antibiotics at length and she is agreeable with waiting for throat culture.   Low suspicion for mononucleosis, epiglottitis, peritonsillar abscess, etc. HEENT exam stable without red flag signs. Will manage this conservatively with supportive care as outlined in AVS.  Salt water gargles as needed, warm water with honey.   Counseled patient on potential for adverse effects with medications prescribed/recommended today, strict ER and return-to-clinic precautions discussed, patient verbalized understanding.    Final  Clinical Impressions(s) / UC Diagnoses   Final diagnoses:  Sore throat  Viral pharyngitis     Discharge Instructions      Strep test in the clinic is negative, staff will call you if the throat culture is positive for bacteria in the next 2-3 days and call in treatment if necessary based on result.   For now we will treat this as a viral illness with supportive care.   You may use lidocaine 15mls every 6 hours as needed for pain of the throat.   You may take tylenol 1,000mg  (2 over the counter extra strength 500mg  pills) every 6 hours as needed for pain.   Salt water gargles every 4-6 hours as needed for throat pain.   Seek medical care if you develop changes to your voice, inability to swallow, drooling, or any new symptoms. If symptoms are severe, please go to the ER. I hope you feel better!       ED Prescriptions     Medication Sig Dispense Auth. Provider   lidocaine (XYLOCAINE) 2 % solution Use as directed 15 mLs in the mouth or throat every 6 (six) hours as needed for mouth pain. 100 mL Enedelia Dorna HERO, FNP      PDMP not reviewed this encounter.   Enedelia Dorna HERO, [place] 05/12/24 1145

## 2024-05-12 NOTE — Discharge Instructions (Signed)
 Strep test in the clinic is negative, staff will call you if the throat culture is positive for bacteria in the next 2-3 days and call in treatment if necessary based on result.   For now we will treat this as a viral illness with supportive care.   You may use lidocaine 15mls every 6 hours as needed for pain of the throat.   You may take tylenol 1,000mg  (2 over the counter extra strength 500mg  pills) every 6 hours as needed for pain.   Salt water gargles every 4-6 hours as needed for throat pain.   Seek medical care if you develop changes to your voice, inability to swallow, drooling, or any new symptoms. If symptoms are severe, please go to the ER. I hope you feel better!

## 2024-05-13 ENCOUNTER — Ambulatory Visit: Payer: Self-pay

## 2024-05-13 NOTE — Telephone Encounter (Signed)
 FYI Only or Action Required?: FYI only for provider.  Patient was last seen in primary care on 01/12/2024 by Joshua Debby CROME, MD.  Called Nurse Triage reporting Wrist Pain.  Symptoms began several months ago.  Interventions attempted: Ice/heat application. - salon pas  Symptoms are: unchanged.  Triage Disposition: See PCP When Office is Open (Within 3 Days)  Patient/caregiver understands and will follow disposition?: Yes                    Call dropped prior to transfer.      Copied from CRM [id number]. Topic: Clinical - Red Word Triage >> May 13, 2024  9:08 AM Hamdi H wrote: [facility] that prompted transfer to Nurse Triage: Injured her wrists 1-2 months ago, wants to discuss a referral with her doctor. Experiencing pain right now in both wrists, went to urgent care yesterday. Reason for Disposition  [1] MODERATE pain (e.g., interferes with normal activities) AND [2] present > 3 days  Answer Assessment - Initial Assessment Questions 1. ONSET: When did the pain start?     1-2 months 2. LOCATION: Where is the pain located?     Both -  worse 3. PAIN: How bad is the pain? (Scale 1-10; or mild, moderate, severe)     9/10 4. WORK OR EXERCISE: Has there been any recent work or exercise that involved this part (i.e., hand or wrist) of the body?     Picking up the baby 5. CAUSE: What do you think is causing the pain?     Unsure 6. AGGRAVATING FACTORS: What makes the pain worse? (e.g., using computer)     Using  hands - picking up heavy things 7. OTHER SYMPTOMS: Do you have any other symptoms? (e.g., fever, neck pain, numbness or tingling, rash, swelling)     no  Protocols used: Wrist Pain-A-AH

## 2024-05-14 ENCOUNTER — Encounter: Payer: Self-pay | Admitting: Internal Medicine

## 2024-05-14 ENCOUNTER — Ambulatory Visit (INDEPENDENT_AMBULATORY_CARE_PROVIDER_SITE_OTHER): Admitting: Internal Medicine

## 2024-05-14 VITALS — BP 160/82 | HR 84 | Temp 98.3°F | Resp 16 | Ht 63.0 in | Wt 158.0 lb

## 2024-05-14 DIAGNOSIS — M654 Radial styloid tenosynovitis [de Quervain]: Secondary | ICD-10-CM | POA: Diagnosis not present

## 2024-05-14 DIAGNOSIS — E1159 Type 2 diabetes mellitus with other circulatory complications: Secondary | ICD-10-CM

## 2024-05-14 DIAGNOSIS — I152 Hypertension secondary to endocrine disorders: Secondary | ICD-10-CM

## 2024-05-14 DIAGNOSIS — Z794 Long term (current) use of insulin: Secondary | ICD-10-CM

## 2024-05-14 DIAGNOSIS — J301 Allergic rhinitis due to pollen: Secondary | ICD-10-CM | POA: Diagnosis not present

## 2024-05-14 DIAGNOSIS — E785 Hyperlipidemia, unspecified: Secondary | ICD-10-CM | POA: Diagnosis not present

## 2024-05-14 DIAGNOSIS — I10 Essential (primary) hypertension: Secondary | ICD-10-CM | POA: Diagnosis not present

## 2024-05-14 DIAGNOSIS — E119 Type 2 diabetes mellitus without complications: Secondary | ICD-10-CM

## 2024-05-14 DIAGNOSIS — M65939 Unspecified synovitis and tenosynovitis, unspecified forearm: Secondary | ICD-10-CM | POA: Insufficient documentation

## 2024-05-14 LAB — CBC WITH DIFFERENTIAL/PLATELET
Basophils Absolute: 0 K/uL (ref 0.0–0.1)
Basophils Relative: 0.8 % (ref 0.0–3.0)
Eosinophils Absolute: 0.1 K/uL (ref 0.0–0.7)
Eosinophils Relative: 2 % (ref 0.0–5.0)
HCT: 39.3 % (ref 36.0–46.0)
Hemoglobin: 12.9 g/dL (ref 12.0–15.0)
Lymphocytes Relative: 33.5 % (ref 12.0–46.0)
Lymphs Abs: 1.4 K/uL (ref 0.7–4.0)
MCHC: 32.8 g/dL (ref 30.0–36.0)
MCV: 90.3 fl (ref 78.0–100.0)
Monocytes Absolute: 0.3 K/uL (ref 0.1–1.0)
Monocytes Relative: 7.4 % (ref 3.0–12.0)
Neutro Abs: 2.4 K/uL (ref 1.4–7.7)
Neutrophils Relative %: 56.3 % (ref 43.0–77.0)
Platelets: 222 K/uL (ref 150.0–400.0)
RBC: 4.36 Mil/uL (ref 3.87–5.11)
RDW: 12.9 % (ref 11.5–15.5)
WBC: 4.2 K/uL (ref 4.0–10.5)

## 2024-05-14 LAB — BASIC METABOLIC PANEL WITH GFR
BUN: 21 mg/dL (ref 6–23)
CO2: 23 meq/L (ref 19–32)
Calcium: 9.1 mg/dL (ref 8.4–10.5)
Chloride: 103 meq/L (ref 96–112)
Creatinine, Ser: 0.69 mg/dL (ref 0.40–1.20)
GFR: 90.16 mL/min (ref >60.00)
Glucose, Bld: 198 mg/dL — ABNORMAL HIGH (ref 70–99)
Potassium: 4.1 meq/L (ref 3.5–5.1)
Sodium: 136 meq/L (ref 135–145)

## 2024-05-14 LAB — URINALYSIS, ROUTINE W REFLEX MICROSCOPIC
Bilirubin Urine: NEGATIVE
Hgb urine dipstick: NEGATIVE
Ketones, ur: NEGATIVE
Leukocytes,Ua: NEGATIVE
Nitrite: NEGATIVE
RBC / HPF: NONE SEEN (ref 0–?)
Specific Gravity, Urine: <=1.005 — AB (ref 1.000–1.030)
Total Protein, Urine: NEGATIVE
Urine Glucose: NEGATIVE
Urobilinogen, UA: 0.2 (ref 0.0–1.0)
pH: 6.5 (ref 5.0–8.0)

## 2024-05-14 LAB — TSH: TSH: 3.75 u[IU]/mL (ref 0.35–5.50)

## 2024-05-14 LAB — MICROALBUMIN / CREATININE URINE RATIO
Creatinine,U: 23.7 mg/dL
Microalb Creat Ratio: UNDETERMINED mg/g (ref 0.0–30.0)
Microalb, Ur: <0.7 mg/dL

## 2024-05-14 LAB — HEMOGLOBIN A1C: Hgb A1c MFr Bld: 7.9 % — ABNORMAL HIGH (ref 4.6–6.5)

## 2024-05-14 MED ORDER — AMLODIPINE BESYLATE 5 MG PO TABS: 5.0000 mg | ORAL_TABLET | Freq: Every day | ORAL | 0 refills | Status: AC

## 2024-05-14 MED ORDER — IPRATROPIUM BROMIDE 0.03 % NA SOLN: 2.0000 | Freq: Two times a day (BID) | NASAL | 3 refills | Status: DC

## 2024-05-14 MED ORDER — INDAPAMIDE 1.25 MG PO TABS: 1.2500 mg | ORAL_TABLET | Freq: Every day | ORAL | 0 refills | Status: AC

## 2024-05-14 MED ORDER — LEVOCETIRIZINE DIHYDROCHLORIDE 5 MG PO TABS: 5.0000 mg | ORAL_TABLET | Freq: Every evening | ORAL | 0 refills | Status: AC

## 2024-05-14 NOTE — Progress Notes (Signed)
 "  Subjective:  Patient ID: [name], female    DOB: 11/25/1955  Age: 68 y.o. MRN: [id number]  CC: Wrist Pain (Bilateral wrist pain. Patient states that she was watching a baby for four months and was constantly picking her up. She thinks that's the cause of the pain. ) and Hypertension   HPI Catherine Murphy presents for f/up ----   Discussed the use of AI scribe software for clinical note transcription with the patient, who gave verbal consent to proceed.  History of Present Illness Catherine Murphy is a 68 year old female who presents with bilateral wrist pain.  She has been experiencing bilateral wrist pain, which began after spending over four months in [place]  taking care of her granddaughter. The pain developed during the third and fourth months of caregiving, primarily due to repetitive lifting of the baby. The pain is localized to both wrists, with the right wrist being worse than the left, and does not radiate to her fingers. She has tried various treatments, including taping her wrists, which provided only temporary relief. Since returning from [place]  over a month ago and not holding the baby, she initially felt improvement, but the pain has recently returned.  She reports sinus congestion and postnasal drip, for which she has been using a nasal spray daily. No nasal discharge from the front of her nose. She has been taking a decongestant but is unsure if it is the best option for her symptoms. She mentions a recent visit to urgent care for sinus issues, where she was given a gargle solution that provided some relief. No recent cough, sore throat, fever, chills, or night sweats.  Her blood pressure was noted to be elevated during a recent visit, although she states it is usually under 130 at home. She has received a flu vaccine.     Outpatient Medications Prior to Visit  Medication Sig Dispense Refill   albuterol  (VENTOLIN  HFA) 108 (90 Base) MCG/ACT inhaler  INHALE 2 PUFFS EVERY 6 HOURS AS NEEDED FOR WHEEZING OR SHORTNESS OF BREATH 8.5 each 4   Blood Glucose Monitoring Suppl (ONETOUCH VERIO) w/Device KIT Inject 1 Act into the skin 2 (two) times daily as needed. Use to monitor blood sugar twice a day. E11.9 1 kit 1   Continuous Blood Gluc Receiver (DEXCOM G7 RECEIVER) DEVI 1 Act by Does not apply route daily. 9 each 1   Continuous Blood Gluc Sensor (DEXCOM G6 SENSOR) MISC Change every 10 days 9 each 2   Continuous Blood Gluc Transmit (DEXCOM G6 TRANSMITTER) MISC Change every 3 months 1 each 2   fluticasone  furoate-vilanterol (BREO ELLIPTA ) 100-25 MCG/ACT AEPB USE 1 INHALATION BY MOUTH DAILY 120 each 0   hydrOXYzine  (ATARAX ) 10 MG tablet Take 1 tablet (10 mg total) by mouth 3 (three) times daily as needed. 270 tablet 0   Insulin  Pen Needle (B-D UF III MINI PEN NEEDLES) 31G X 5 MM MISC by Does not apply route.     Lancets (ONETOUCH DELICA PLUS LANCET33G) MISC USE AS DIRECTED TO CHECK BLOOD SUGAR TWICE A DAY 100 each 5   lidocaine  (XYLOCAINE ) 2 % solution Use as directed 15 mLs in the mouth or throat every 6 (six) hours as needed for mouth pain. 100 mL 0   losartan  (COZAAR ) 100 MG tablet Take 1 tablet (100 mg total) by mouth daily. 90 tablet 1   metFORMIN  (GLUCOPHAGE -XR) 500 MG 24 hr tablet Take 2 tablets (1,000 mg total) by mouth daily with  breakfast. 180 tablet 1   NOVOLOG  100 UNIT/ML injection INJECT SUBCUTANEOUSLY FOR USE IN PUMP, TOTAL OF 60 UNITS DAILY 60 mL 3   ONETOUCH VERIO test strip USE 1 STRIP IN THE MORNING, AT NOON, AND AT BEDTIME AS DIRECTED 100 strip 5   simvastatin  (ZOCOR ) 10 MG tablet Take 1 tablet (10 mg total) by mouth at bedtime. 90 tablet 1   ipratropium (ATROVENT ) 0.03 % nasal spray USE 2 SPRAYS IN BOTH NOSTRILS  EVERY 12 HOURS 60 mL 3   No facility-administered medications prior to visit.    ROS Review of Systems  Constitutional:  Negative for appetite change, chills, diaphoresis, fatigue and fever.  HENT:  Positive for  congestion, postnasal drip and rhinorrhea. Negative for facial swelling, sinus pressure and sinus pain.   Eyes: Negative.   Respiratory:  Negative for cough, chest tightness, shortness of breath and wheezing.   Cardiovascular:  Negative for chest pain, palpitations and leg swelling.  Gastrointestinal: Negative.  Negative for abdominal pain, blood in stool, constipation, diarrhea, nausea and vomiting.  Endocrine: Negative.   Genitourinary: Negative.  Negative for difficulty urinating.  Musculoskeletal:  Positive for arthralgias. Negative for joint swelling and myalgias.  Neurological: Negative.  Negative for dizziness, weakness and headaches.  Hematological:  Negative for adenopathy. Does not bruise/bleed easily.  Psychiatric/Behavioral: Negative.      Objective:  BP (!) 160/82 (BP Location: Left Arm, Patient Position: Sitting, Cuff Size: Normal) Comment: BP (R) 152/86  Pulse 84   Temp 98.3 F (36.8 C) (Oral)   Resp 16   Ht 5' 3 (1.6 m)   Wt 158 lb (71.7 kg)   SpO2 96%   BMI 27.99 kg/m   BP Readings from Last 3 Encounters:  05/14/24 (!) 160/82  05/12/24 (!) 154/74  01/12/24 138/72    Wt Readings from Last 3 Encounters:  05/14/24 158 lb (71.7 kg)  04/25/24 151 lb (68.5 kg)  01/12/24 151 lb 3.2 oz (68.6 kg)    Physical Exam Vitals reviewed.  Constitutional:      Appearance: Normal appearance.  HENT:     Nose: Nose normal.     Mouth/Throat:     Mouth: Mucous membranes are moist.  Eyes:     General: No scleral icterus.    Conjunctiva/sclera: Conjunctivae normal.  Cardiovascular:     Rate and Rhythm: Normal rate and regular rhythm.     Heart sounds: No murmur heard.    No friction rub. No gallop.     Comments: EKG--- NSR, 71 bpm No LVH, Q waves, or ST/T wave changes  Pulmonary:     Effort: Pulmonary effort is normal.     Breath sounds: No stridor. No wheezing, rhonchi or rales.  Abdominal:     General: Abdomen is flat.     Palpations: There is no mass.      Tenderness: There is no abdominal tenderness. There is no guarding.     Hernia: No hernia is present.  Musculoskeletal:        General: Normal range of motion.     Right wrist: Tenderness and bony tenderness present. No swelling, deformity, effusion, snuff box tenderness or crepitus. Normal range of motion. Normal pulse.     Left wrist: Tenderness and bony tenderness present. No swelling, deformity, effusion, snuff box tenderness or crepitus. Normal range of motion. Normal pulse.     Cervical back: Neck supple.     Right lower leg: No edema.     Left lower leg: No edema.  Comments: Ttp over bilateral radial styloids  Lymphadenopathy:     Cervical: No cervical adenopathy.  Skin:    General: Skin is warm and dry.  Neurological:     General: No focal deficit present.     Mental Status: She is alert.  Psychiatric:        Mood and Affect: Mood normal.        Behavior: Behavior normal.     Lab Results  Component Value Date   WBC 4.2 05/14/2024   HGB 12.9 05/14/2024   HCT 39.3 05/14/2024   PLT 222.0 05/14/2024   GLUCOSE 198 (H) 05/14/2024   CHOL 209 (H) 01/12/2024   TRIG 56 01/12/2024   HDL 98 01/12/2024   LDLCALC 97 01/12/2024   ALT 15 08/15/2023   AST 24 08/15/2023   NA 136 05/14/2024   K 4.1 05/14/2024   CL 103 05/14/2024   CREATININE 0.69 05/14/2024   BUN 21 05/14/2024   CO2 23 05/14/2024   TSH 3.75 05/14/2024   HGBA1C 7.9 (H) 05/14/2024   MICROALBUR <0.7 05/14/2024    No results found.   Assessment & Plan:  Insulin -requiring or dependent type II diabetes mellitus (HCC) -     Urinalysis, Routine w reflex microscopic; Future -     Hemoglobin A1c; Future -     Basic metabolic panel with GFR; Future -     CBC with Differential/Platelet; Future -     Microalbumin / creatinine urine ratio; Future  Seasonal allergic rhinitis due to pollen -     Ipratropium Bromide ; Place 2 sprays into the nose 2 (two) times daily.  Dispense: 60 mL; Refill: 3 -     Levocetirizine  Dihydrochloride; Take 1 tablet (5 mg total) by mouth every evening.  Dispense: 90 tablet; Refill: 0  Radial styloid tenosynovitis of both hands -     Ambulatory referral to Physical Medicine Rehab  Hypertension associated with diabetes (HCC)- BP is not at goal. EKG is negative for LVH. Will try to achieve better BP control. -     amLODIPine  Besylate; Take 1 tablet (5 mg total) by mouth daily.  Dispense: 90 tablet; Refill: 0 -     Indapamide ; Take 1 tablet (1.25 mg total) by mouth daily.  Dispense: 90 tablet; Refill: 0       Follow-up: Return in about 3 months (around 08/14/2024).  Debby Molt, MD "

## 2024-05-14 NOTE — Patient Instructions (Signed)
 Hypertension, Adult High blood pressure (hypertension) is when the force of blood pumping through the arteries is too strong. The arteries are the blood vessels that carry blood from the heart throughout the body. Hypertension forces the heart to work harder to pump blood and may cause arteries to become narrow or stiff. Untreated or uncontrolled hypertension can lead to a heart attack, heart failure, a stroke, kidney disease, and other problems. A blood pressure reading consists of a higher number over a lower number. Ideally, your blood pressure should be below 120/80. The first ("top") number is called the systolic pressure. It is a measure of the pressure in your arteries as your heart beats. The second ("bottom") number is called the diastolic pressure. It is a measure of the pressure in your arteries as the heart relaxes. What are the causes? The exact cause of this condition is not known. There are some conditions that result in high blood pressure. What increases the risk? Certain factors may make you more likely to develop high blood pressure. Some of these risk factors are under your control, including: Smoking. Not getting enough exercise or physical activity. Being overweight. Having too much fat, sugar, calories, or salt (sodium) in your diet. Drinking too much alcohol. Other risk factors include: Having a personal history of heart disease, diabetes, high cholesterol, or kidney disease. Stress. Having a family history of high blood pressure and high cholesterol. Having obstructive sleep apnea. Age. The risk increases with age. What are the signs or symptoms? High blood pressure may not cause symptoms. Very high blood pressure (hypertensive crisis) may cause: Headache. Fast or irregular heartbeats (palpitations). Shortness of breath. Nosebleed. Nausea and vomiting. Vision changes. Severe chest pain, dizziness, and seizures. How is this diagnosed? This condition is diagnosed by  measuring your blood pressure while you are seated, with your arm resting on a flat surface, your legs uncrossed, and your feet flat on the floor. The cuff of the blood pressure monitor will be placed directly against the skin of your upper arm at the level of your heart. Blood pressure should be measured at least twice using the same arm. Certain conditions can cause a difference in blood pressure between your right and left arms. If you have a high blood pressure reading during one visit or you have normal blood pressure with other risk factors, you may be asked to: Return on a different day to have your blood pressure checked again. Monitor your blood pressure at home for 1 week or longer. If you are diagnosed with hypertension, you may have other blood or imaging tests to help your health care provider understand your overall risk for other conditions. How is this treated? This condition is treated by making healthy lifestyle changes, such as eating healthy foods, exercising more, and reducing your alcohol intake. You may be referred for counseling on a healthy diet and physical activity. Your health care provider may prescribe medicine if lifestyle changes are not enough to get your blood pressure under control and if: Your systolic blood pressure is above 130. Your diastolic blood pressure is above 80. Your personal target blood pressure may vary depending on your medical conditions, your age, and other factors. Follow these instructions at home: Eating and drinking  Eat a diet that is high in fiber and potassium, and low in sodium, added sugar, and fat. An example of this eating plan is called the DASH diet. DASH stands for Dietary Approaches to Stop Hypertension. To eat this way: Eat  plenty of fresh fruits and vegetables. Try to fill one half of your plate at each meal with fruits and vegetables. Eat whole grains, such as whole-wheat pasta, brown rice, or whole-grain bread. Fill about one  fourth of your plate with whole grains. Eat or drink low-fat dairy products, such as skim milk or low-fat yogurt. Avoid fatty cuts of meat, processed or cured meats, and poultry with skin. Fill about one fourth of your plate with lean proteins, such as fish, chicken without skin, beans, eggs, or tofu. Avoid pre-made and processed foods. These tend to be higher in sodium, added sugar, and fat. Reduce your daily sodium intake. Many people with hypertension should eat less than 1,500 mg of sodium a day. Do not drink alcohol if: Your health care provider tells you not to drink. You are pregnant, may be pregnant, or are planning to become pregnant. If you drink alcohol: Limit how much you have to: 0-1 drink a day for women. 0-2 drinks a day for men. Know how much alcohol is in your drink. In the [place] one drink equals one 12 oz bottle of beer (355 mL), one 5 oz glass of wine (148 mL), or one 1 oz glass of hard liquor (44 mL). Lifestyle  Work with your health care provider to maintain a healthy body weight or to lose weight. Ask what an ideal weight is for you. Get at least 30 minutes of exercise that causes your heart to beat faster (aerobic exercise) most days of the week. Activities may include walking, swimming, or biking. Include exercise to strengthen your muscles (resistance exercise), such as Pilates or lifting weights, as part of your weekly exercise routine. Try to do these types of exercises for 30 minutes at least 3 days a week. Do not use any products that contain nicotine or tobacco. These products include cigarettes, chewing tobacco, and vaping devices, such as e-cigarettes. If you need help quitting, ask your health care provider. Monitor your blood pressure at home as told by your health care provider. Keep all follow-up visits. This is important. Medicines Take over-the-counter and prescription medicines only as told by your health care provider. Follow directions carefully. Blood  pressure medicines must be taken as prescribed. Do not skip doses of blood pressure medicine. Doing this puts you at risk for problems and can make the medicine less effective. Ask your health care provider about side effects or reactions to medicines that you should watch for. Contact a health care provider if you: Think you are having a reaction to a medicine you are taking. Have headaches that keep coming back (recurring). Feel dizzy. Have swelling in your ankles. Have trouble with your vision. Get help right away if you: Develop a severe headache or confusion. Have unusual weakness or numbness. Feel faint. Have severe pain in your chest or abdomen. Vomit repeatedly. Have trouble breathing. These symptoms may be an emergency. Get help right away. Call 911. Do not wait to see if the symptoms will go away. Do not drive yourself to the hospital. Summary Hypertension is when the force of blood pumping through your arteries is too strong. If this condition is not controlled, it may put you at risk for serious complications. Your personal target blood pressure may vary depending on your medical conditions, your age, and other factors. For most people, a normal blood pressure is less than 120/80. Hypertension is treated with lifestyle changes, medicines, or a combination of both. Lifestyle changes include losing weight, eating a healthy,  low-sodium diet, exercising more, and limiting alcohol. This information is not intended to replace advice given to you by your health care provider. Make sure you discuss any questions you have with your health care provider. Document Revised: 05/25/2021 Document Reviewed: 05/25/2021 Elsevier Patient Education  2024 [employer].

## 2024-05-14 NOTE — Telephone Encounter (Signed)
 Pt was seen in office today 05/14/24 by Dr. Joshua. Problem has been addressed.

## 2024-05-15 ENCOUNTER — Telehealth: Payer: Self-pay

## 2024-05-15 ENCOUNTER — Ambulatory Visit: Payer: Self-pay | Admitting: Internal Medicine

## 2024-05-15 ENCOUNTER — Ambulatory Visit (HOSPITAL_COMMUNITY): Payer: Self-pay

## 2024-05-15 LAB — CULTURE, GROUP A STREP (THRC)

## 2024-05-15 NOTE — Telephone Encounter (Signed)
 Copied from CRM #[id number]. Topic: Clinical - Lab/Test Results >> May 15, 2024 11:00 AM Laymon HERO wrote: Reason for CRM: patient asking for nurse to contact her to go over A1C- she said there is a mistake

## 2024-05-15 NOTE — Telephone Encounter (Signed)
 Advised the patient that the lab drew her blood for these results. She said did they make a mistake ? I advised her we didn't receive any messages stating that the lab made any mistakes. She gave a verbal understanding

## 2024-05-16 ENCOUNTER — Telehealth: Payer: Self-pay | Admitting: *Deleted

## 2024-05-16 NOTE — Progress Notes (Signed)
 Care Guide Pharmacy Note  05/16/2024 Name: [name] MRN: [id number] DOB: September 11, 1955  Referred By: Joshua Debby CROME, MD Reason for referral: Complex Care Management (Outreach to schedule referral with pharmacist )   Catherine Murphy is a 68 y.o. year old female who is a primary care patient of Joshua Debby CROME, MD.  Doc Siearra Amberg was referred to the pharmacist for assistance related to: HTN  Successful contact was made with the patient to discuss pharmacy services.  Patient declines engagement at this time. Contact information was provided to the patient should they wish to reach out for assistance at a later time.  Thedford Franks, CMA [facility] Guide Direct Dial : [phone]  Fax: [phone] Website: [email or web address]

## 2024-05-27 ENCOUNTER — Ambulatory Visit: Admitting: Sports Medicine

## 2024-05-27 ENCOUNTER — Other Ambulatory Visit: Payer: Self-pay

## 2024-05-27 ENCOUNTER — Encounter: Payer: Self-pay | Admitting: Sports Medicine

## 2024-05-27 ENCOUNTER — Other Ambulatory Visit (INDEPENDENT_AMBULATORY_CARE_PROVIDER_SITE_OTHER): Payer: Self-pay

## 2024-05-27 DIAGNOSIS — M654 Radial styloid tenosynovitis [de Quervain]: Secondary | ICD-10-CM

## 2024-05-27 DIAGNOSIS — E119 Type 2 diabetes mellitus without complications: Secondary | ICD-10-CM | POA: Diagnosis not present

## 2024-05-27 DIAGNOSIS — M25532 Pain in left wrist: Secondary | ICD-10-CM | POA: Diagnosis not present

## 2024-05-27 DIAGNOSIS — M25531 Pain in right wrist: Secondary | ICD-10-CM | POA: Diagnosis not present

## 2024-05-27 DIAGNOSIS — Z794 Long term (current) use of insulin: Secondary | ICD-10-CM

## 2024-05-27 DIAGNOSIS — M19031 Primary osteoarthritis, right wrist: Secondary | ICD-10-CM

## 2024-05-27 DIAGNOSIS — M19032 Primary osteoarthritis, left wrist: Secondary | ICD-10-CM

## 2024-05-27 MED ORDER — MELOXICAM 15 MG PO TABS: 15.0000 mg | ORAL_TABLET | Freq: Every day | ORAL | 0 refills | Status: AC

## 2024-05-27 NOTE — Progress Notes (Signed)
 Patient says that she has been having pain in both wrists for about 3 months. She says that she was lifting her grandchild and as her grandchild grew, her wrist pain began. She points over the radial aspect of the wrist when describing her pain. She has pain with all ROM at the wrist, although it is worst with pronation, supination, and ulnar deviation. She has not gotten relief with home treatments. She is inquiring about injections today due to recommendation from her PCP.

## 2024-05-27 NOTE — Progress Notes (Signed)
 Doc Kyeisha Janowicz - 68 y.o. female MRN [id number]  Date of birth: 1955-09-06  Office Visit Note: Visit Date: 05/27/2024 PCP: Joshua Debby CROME, MD Referred by: Joshua Debby CROME, MD  Subjective: Chief Complaint  Patient presents with   Right Wrist - Pain   Left Wrist - Pain   HPI: Catherine Murphy is a pleasant 68 y.o. female who presents today for bilateral radial wrist pain x 3-4 months.  Catherine Murphy has been experiencing bilateral radial sided wrist pain for the last 3-4 months.  This started after spending about 4 months in [place]  taking care of her granddaughter in which she was doing a lot of lifting and repetitive motions/lifting while caring for her.   She is a type 2 insulin -dependent diabetic.  She does have a CGM, Dexcom and is managed on NovoLog  subcutaneous injections.  Also on metformin  1000 mg twice daily.  Her CGM did read that her sugar level was approximately 300 before the injection today. She did bolus her insulin . Lab Results  Component Value Date   HGBA1C 7.9 (H) 05/14/2024   Pertinent ROS were reviewed with the patient and found to be negative unless otherwise specified above in HPI.   Assessment & Plan: Visit Diagnoses:  1. De Quervain's tenosynovitis, bilateral   2. Bilateral wrist pain   3. Osteoarthritis of both wrists, unspecified osteoarthritis type   4. Insulin -requiring or dependent type II diabetes mellitus (HCC)    Plan: Impression is chronic bilateral wrist pain which has evidence of de Quervain's tenosynovitis as well as at least mild osteoarthritis of both wrists.  Her pain flared up after doing a lot of lifting/repetitive activity with the birth of a grandchild and her pain has not settled down.  She is a type II diabetic who is insulin -dependent, her sugar level was 300 prior to the injection but she was able to bolus her insulin .  Through shared decision making, we did proceed with ultrasound-guided right first dorsal compartment tendon sheath injection,  patient tolerated well.  Advised she needs to check her sugars/CGM over the next 48 to 72 hours for transient rise and adjust as needed.  She will continue her metformin  1000 mg twice daily as well.  The right wrist was the most symptomatic so we did start with this.  I would like to place her on a short course of meloxicam  15 mg once daily for the inflammatory component of her wrist pain.  We will see how this does for both wrist.  She is leaving for [place]  in about 2 weeks, I will see her in follow-up with attention to the left wrist and see how she did with today's injection for the right wrist.  Could consider repeating injection on that side if the right side was helpful.  I do think she would benefit from thumb spica bracing, she will bring in her braces from home so I can see what she has and make modifications as indicated.  Follow-up: Return in about 10 days (around 06/06/2024) for Schedule for left wrist  Meds & Orders:  Meds ordered this encounter  Medications   meloxicam  (MOBIC ) 15 MG tablet    Sig: Take 1 tablet (15 mg total) by mouth daily.    Dispense:  21 tablet    Refill:  0    Orders Placed This Encounter  Procedures   XR Wrist Complete Right   XR Wrist Complete Left   US  Guided Needle Placement - No Linked Charges  Procedures: US -guided 1st Dorsal Compartment, Tendon Sheath Injection - Right Wrist: After discussion on risk/benefits/indications, an informed verbal consent was obtained. A timeout was then performed. The patient was seated on exam table with wrist resting on table in a neutral position with radial wrist facing upward. The area overlying the 1st dorsal compartment of the wrist was prepped with Betadine and alcohol swabs. Ultrasound guidance with sterile gel was used in short axis plane to identify the APL/EPB tendons and tendon sheath. Using ultrasound guidance via an in-plane approach, a 25-gauge, 1.0 needle was inserted into the tendon sheath of the  first dorsal compartment with subsequent injection of 1:1:1 cc of lidocaine:bupivicaine:betamethasone. Appropriate spread of the injectate within the tendon sheath was visualized with ultrasound. Patient tolerated the procedure well without immediate complications.  A Band-Aid was then applied.          Clinical History: No specialty comments available.  She reports that she has never smoked. She has never used smokeless tobacco.  Recent Labs    06/08/23 0000 12/28/23 0000 05/14/24 1130  HGBA1C 7.5 7.4 7.9*    Objective:    Physical Exam  Gen: Well-appearing, in no acute distress; non-toxic CV: Well-perfused. Warm.  Resp: Breathing unlabored on room air; no wheezing. Psych: Fluid speech in conversation; appropriate affect; normal thought process  Ortho Exam - Bilateral wrists: + TTP over the radial aspect of bilateral wrist near the region of the first dorsal compartment.  There is soft tissue swelling over this region of the right greater than left wrist.  Positive Finkelstein's test.  No tenderness with palpation over the radiocarpal joint itself.  No effusion noted.  Imaging: US  Guided Needle Placement - No Linked Charges Result Date: 05/27/2024 Procedurally successful ultrasound-guided right first dorsal compartment injection. Bedside ultrasound does show notable hyperemia and tenosynovitis within the tendon sheath.  XR Wrist Complete Right Result Date: 05/27/2024 Three-view x-ray of the right shoulder including AP, lateral and oblique film were ordered and reviewed by myself.  X-ray demonstrates mild to moderate radiocarpal joint space narrowing with mild arthritic change.  There is calcification just off the radial styloid, possibly indicative of chondrocalcinosis/CPPD.  No acute fracture noted.  XR Wrist Complete Left Result Date: 05/27/2024 Three-view x-ray of the left wrist including AP, oblique and lateral film was ordered and reviewed by myself.  X-rays demonstrate  mild to moderate radiocarpal narrowing with mild arthritic change.  There is a ossicle/calcification superior to the ulnar styloid, likely accessory os. No acute fracture.     Past Medical/Family/Surgical/Social History: Medications & Allergies reviewed per EMR, new medications updated. Patient Active Problem List   Diagnosis Date Noted   Flexor carpi ulnaris tenosynovitis 05/14/2024   Radial styloid tenosynovitis of both hands 05/14/2024   Osteopenia after menopause 01/12/2024   Screening mammogram for breast cancer 08/08/2023   Asthma 08/07/2023   Hyperglycemia due to type 1 diabetes mellitus (HCC) 08/07/2023   Laryngopharyngeal reflux (LPR) 07/13/2023   Seasonal allergic rhinitis due to pollen 10/27/2022   Estrogen deficiency 10/27/2022   Insulin -requiring or dependent type II diabetes mellitus (HCC) 08/25/2022   Hypertension associated with diabetes (HCC) 08/17/2022   Chronic renal disease, stage 2, mildly decreased glomerular filtration rate (GFR) between 60-89 mL/min/1.73 square meter 07/29/2022   Microalbuminuria due to type 2 diabetes mellitus (HCC) 07/29/2022   DDD (degenerative disc disease), lumbar 07/29/2022   Chronic bilateral low back pain with bilateral sciatica 07/28/2022   Mild persistent asthma without complication 04/28/2022   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) 11/08/2021   Nuclear sclerotic cataract of left eye 11/08/2021   Pseudophakia, right eye 11/08/2021   Epiretinal membrane 09/28/2021   Hyperlipidemia with target LDL less than 100 03/24/2021   Screening for cervical cancer 03/24/2021   Encounter for long-term (current) use of insulin  (HCC) 03/24/2021   Visit for screening mammogram 03/09/2021   Past Medical History:  Diagnosis Date   Diabetes mellitus without complication (HCC)    Family History  Problem Relation Age of Onset   Ovarian cancer Mother 58   Glaucoma Father    Colon cancer Father    Diabetes Neg Hx    BRCA 1/2  Neg Hx    Breast cancer Neg Hx    Past Surgical History:  Procedure Laterality Date   CATARACT EXTRACTION Right    COLONOSCOPY     TRIGGER FINGER RELEASE     Social History   Occupational History   Occupation: Retired  Tobacco Use   Smoking status: Never   Smokeless tobacco: Never  Vaping Use   Vaping status: Never Used  Substance and Sexual Activity   Alcohol use: Not Currently   Drug use: Never   Sexual activity: Yes    Birth control/protection: Post-menopausal

## 2024-06-02 LAB — ALDOSTERONE + RENIN ACTIVITY W/ RATIO
ALDO / PRA Ratio: 0.9 ratio (ref 0.9–28.9)
Aldosterone: 2 ng/dL
Renin Activity: 2.14 ng/mL/h (ref 0.25–5.82)

## 2024-06-03 ENCOUNTER — Encounter: Payer: Self-pay | Admitting: Radiology

## 2024-06-07 ENCOUNTER — Other Ambulatory Visit: Payer: Self-pay

## 2024-06-07 ENCOUNTER — Encounter: Payer: Self-pay | Admitting: Sports Medicine

## 2024-06-07 ENCOUNTER — Ambulatory Visit: Admitting: Sports Medicine

## 2024-06-07 DIAGNOSIS — M19032 Primary osteoarthritis, left wrist: Secondary | ICD-10-CM

## 2024-06-07 DIAGNOSIS — M25532 Pain in left wrist: Secondary | ICD-10-CM | POA: Diagnosis not present

## 2024-06-07 DIAGNOSIS — E119 Type 2 diabetes mellitus without complications: Secondary | ICD-10-CM | POA: Diagnosis not present

## 2024-06-07 DIAGNOSIS — M19031 Primary osteoarthritis, right wrist: Secondary | ICD-10-CM

## 2024-06-07 DIAGNOSIS — M25531 Pain in right wrist: Secondary | ICD-10-CM

## 2024-06-07 DIAGNOSIS — Z794 Long term (current) use of insulin: Secondary | ICD-10-CM

## 2024-06-07 DIAGNOSIS — M654 Radial styloid tenosynovitis [de Quervain]: Secondary | ICD-10-CM | POA: Diagnosis not present

## 2024-06-07 NOTE — Progress Notes (Signed)
 Doc Cheryln Balcom - 68 y.o. female MRN [id number]  Date of birth: 1956/01/29  Office Visit Note: Visit Date: 06/07/2024 PCP: Joshua Debby CROME, MD Referred by: Joshua Debby CROME, MD  Subjective: Chief Complaint  Patient presents with   Right Wrist - Follow-up   Left Wrist - Follow-up   HPI: Catherine Murphy is a pleasant 68 y.o. female who presents today for follow-up of bilateral wrist pain.  The right wrist is doing much better.  She no longer has any pain over the radial side of the wrist.  She does feel some discomfort in the thumb/forearm with certain provocative motions but in general largely improved.  She did bring in her brace which is a CMC joint brace.  The left wrist continues to be bothersome although it was not as severe as the right side.  She has found benefit from meloxicam  15 mg once daily.  She did report she had 2 days of elevated sugars and would like to avoid this if possible.  She was able to provide NovoLog  boluses but still prefers to keep sugar levels at bay if possible.  She is leaving for [place]  to see her granddaughter who turns 10 months tomorrow.  She has been doing some exercises on YouTube but wants help regarding which when she should do it other therapy based exercises.  Pertinent ROS were reviewed with the patient and found to be negative unless otherwise specified above in HPI.   Assessment & Plan: Visit Diagnoses:  1. De Quervain's tenosynovitis, bilateral   2. Osteoarthritis of both wrists, unspecified osteoarthritis type   3. Insulin -requiring or dependent type II diabetes mellitus (HCC)    Plan: Impression is chronic bilateral wrist pain which does have mild arthritic change at the radiocarpal joint, although more of her symptoms are emanating from her de Quervain's tenosynovitis bilaterally.  She did receive very good relief from previous right sided injection.  She is interested in pursuing this for the left side today, although would like  to avoid glucose spike if possible.  Through shared decision making, we did proceed to perform ultrasound-guided first dorsal compartment injection although we did use only 1/2 cc of steroid today.  Patient tolerated well, advised on postinjection protocol.  She will check her sugars/CGM over the next 48 to 72 hours for transient rise and adjust her Novolog  as needed. She will continue her metformin  1000 mg twice daily as well.  I would like her to continue her meloxicam  15 mg for the next 10 days and then she will discontinue.  She does have a CMC brace, I did discuss with her this is not shutting down the wrist compartments which are causing her pain, however we did fit her for a thumb spica brace but this was too bulky and discomforting for her.  When she returns from [place] , I would like her to see our occupational therapist, Spurgeon Ada for a guided exercise regimen for her wrist/De Quervain's and he can possibly consider creating or advising her on bracing for this that is less cumbersome.  She will follow-up with me as needed.  Follow-up: Return if symptoms worsen or fail to improve.   Meds & Orders: No orders of the defined types were placed in this encounter.   Orders Placed This Encounter  Procedures   US  Guided Needle Placement - No Linked Charges   Ambulatory referral to Occupational Therapy     Procedures: US -guided 1st Dorsal Compartment, Tendon Sheath Injection - Left  Wrist: After discussion on risk/benefits/indications, an informed verbal consent was obtained. A timeout was then performed. The patient was seated on exam table with wrist resting on table in a neutral position with radial wrist facing upward. The area overlying the 1st dorsal compartment of the wrist was prepped with Betadine and alcohol swabs. Ultrasound guidance with sterile gel was used in short axis plane to identify the APL/EPB tendons and tendon sheath. Using ultrasound guidance via an in-plane approach, a  25-gauge, 1.0 needle was inserted into the tendon sheath of the first dorsal compartment with subsequent injection of 0.5:1:0.5 cc of lidocaine:bupivicaine:betamethasone. Appropriate spread of the injectate within the tendon sheath was visualized with ultrasound. Patient tolerated the procedure well without immediate complications.  A Band-Aid was then applied.       Clinical History: No specialty comments available.  She reports that she has never smoked. She has never used smokeless tobacco.  Recent Labs    12/28/23 0000 05/14/24 1130  HGBA1C 7.4 7.9*    Objective:    Physical Exam  Gen: Well-appearing, in no acute distress; non-toxic CV: Well-perfused. Warm.  Resp: Breathing unlabored on room air; no wheezing. Psych: Fluid speech in conversation; appropriate affect; normal thought process  Ortho Exam - Right wrist: No bony tenderness of the radial styloid.  She is able to perform Finkelstein's test today with minimal pain.  - Left wrist: + TTP over the first dorsal compartment, positive Finkelstein's test.  There is no grip weakness. OK sign intact.  Imaging:  *Previous imaging from 05/27/24:  XR Wrist Complete Right Three-view x-ray of the right shoulder including AP, lateral and oblique  film were ordered and reviewed by myself.  X-ray demonstrates mild to  moderate radiocarpal joint space narrowing with mild arthritic change.   There is calcification just off the radial styloid, possibly indicative of  chondrocalcinosis/CPPD.  No acute fracture noted. XR Wrist Complete Left Three-view x-ray of the left wrist including AP, oblique and lateral film  was ordered and reviewed by myself.  X-rays demonstrate mild to moderate  radiocarpal narrowing with mild arthritic change.  There is a  ossicle/calcification superior to the ulnar styloid, likely accessory os.  No acute fracture.    Past Medical/Family/Surgical/Social History: Medications & Allergies reviewed per EMR,  new medications updated. Patient Active Problem List   Diagnosis Date Noted   Flexor carpi ulnaris tenosynovitis 05/14/2024   Radial styloid tenosynovitis of both hands 05/14/2024   Osteopenia after menopause 01/12/2024   Screening mammogram for breast cancer 08/08/2023   Asthma 08/07/2023   Hyperglycemia due to type 1 diabetes mellitus (HCC) 08/07/2023   Laryngopharyngeal reflux (LPR) 07/13/2023   Seasonal allergic rhinitis due to pollen 10/27/2022   Estrogen deficiency 10/27/2022   Insulin -requiring or dependent type II diabetes mellitus (HCC) 08/25/2022   Hypertension associated with diabetes (HCC) 08/17/2022   Chronic renal disease, stage 2, mildly decreased glomerular filtration rate (GFR) between 60-89 mL/min/1.73 square meter 07/29/2022   Microalbuminuria due to type 2 diabetes mellitus (HCC) 07/29/2022   DDD (degenerative disc disease), lumbar 07/29/2022   Chronic bilateral low back pain with bilateral sciatica 07/28/2022   Mild persistent asthma without complication 04/28/2022   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) 11/08/2021   Nuclear sclerotic cataract of left eye 11/08/2021   Pseudophakia, right eye 11/08/2021   Epiretinal membrane 09/28/2021   Hyperlipidemia with target LDL less than 100 03/24/2021   Screening for cervical cancer 03/24/2021   Encounter for long-term (current) use of insulin  (  HCC) 03/24/2021   Visit for screening mammogram 03/09/2021   Past Medical History:  Diagnosis Date   Diabetes mellitus without complication (HCC)    Family History  Problem Relation Age of Onset   Ovarian cancer Mother 35   Glaucoma Father    Colon cancer Father    Diabetes Neg Hx    BRCA 1/2 Neg Hx    Breast cancer Neg Hx    Past Surgical History:  Procedure Laterality Date   CATARACT EXTRACTION Right    COLONOSCOPY     TRIGGER FINGER RELEASE     Social History   Occupational History   Occupation: Retired  Tobacco Use   Smoking status: Never    Smokeless tobacco: Never  Vaping Use   Vaping status: Never Used  Substance and Sexual Activity   Alcohol use: Not Currently   Drug use: Never   Sexual activity: Yes    Birth control/protection: Post-menopausal

## 2024-06-07 NOTE — Progress Notes (Signed)
 Patient says that her right wrist is feeling much better from the injection. She says that the left wrist is also feeling a bit better, although she does still have some pain. She is asking whether she should have the second injection today. Her sugars were elevated for two days after the last injection. She is still in her 21 days of Meloxicam , so continues taking that consistently.

## 2024-06-12 ENCOUNTER — Ambulatory Visit: Admitting: Occupational Therapy

## 2024-06-20 ENCOUNTER — Ambulatory Visit: Attending: Sports Medicine | Admitting: Occupational Therapy

## 2024-06-20 ENCOUNTER — Other Ambulatory Visit: Payer: Self-pay

## 2024-06-20 DIAGNOSIS — M654 Radial styloid tenosynovitis [de Quervain]: Secondary | ICD-10-CM | POA: Diagnosis not present

## 2024-06-20 DIAGNOSIS — M79641 Pain in right hand: Secondary | ICD-10-CM | POA: Diagnosis present

## 2024-06-20 DIAGNOSIS — R278 Other lack of coordination: Secondary | ICD-10-CM | POA: Insufficient documentation

## 2024-06-20 DIAGNOSIS — M25531 Pain in right wrist: Secondary | ICD-10-CM | POA: Diagnosis present

## 2024-06-20 DIAGNOSIS — M79642 Pain in left hand: Secondary | ICD-10-CM | POA: Diagnosis present

## 2024-06-20 DIAGNOSIS — M25532 Pain in left wrist: Secondary | ICD-10-CM | POA: Insufficient documentation

## 2024-06-20 NOTE — Therapy (Signed)
 OUTPATIENT OCCUPATIONAL THERAPY ORTHO EVALUATION  Patient Name: Catherine Murphy MRN: [id number] DOB:02/14/1956, 68 y.o., female Today's Date: 06/20/2024  PCP: Joshua Debby CROME, MD REFERRING PROVIDER: Burnetta Brunet, DO  END OF SESSION:  OT End of Session - 06/20/24 1505     Visit Number 1    Number of Visits 10    Date for Recertification  08/02/24    Authorization Type Medicare Part A&B / [employer]BS Supp 2025    OT Start Time 1316    OT Stop Time 1402    OT Time Calculation (min) 46 min          Past Medical History:  Diagnosis Date   Diabetes mellitus without complication (HCC)    Past Surgical History:  Procedure Laterality Date   CATARACT EXTRACTION Right    COLONOSCOPY     TRIGGER FINGER RELEASE     Patient Active Problem List   Diagnosis Date Noted   Flexor carpi ulnaris tenosynovitis 05/14/2024   Radial styloid tenosynovitis of both hands 05/14/2024   Osteopenia after menopause 01/12/2024   Screening mammogram for breast cancer 08/08/2023   Asthma 08/07/2023   Hyperglycemia due to type 1 diabetes mellitus (HCC) 08/07/2023   Laryngopharyngeal reflux (LPR) 07/13/2023   Seasonal allergic rhinitis due to pollen 10/27/2022   Estrogen deficiency 10/27/2022   Insulin -requiring or dependent type II diabetes mellitus (HCC) 08/25/2022   Hypertension associated with diabetes (HCC) 08/17/2022   Chronic renal disease, stage 2, mildly decreased glomerular filtration rate (GFR) between 60-89 mL/min/1.73 square meter 07/29/2022   Microalbuminuria due to type 2 diabetes mellitus (HCC) 07/29/2022   DDD (degenerative disc disease), lumbar 07/29/2022   Chronic bilateral low back pain with bilateral sciatica 07/28/2022   Mild persistent asthma without complication 04/28/2022   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) 11/08/2021   Nuclear sclerotic cataract of left eye 11/08/2021   Pseudophakia, right eye 11/08/2021   Epiretinal membrane 09/28/2021   Hyperlipidemia  with target LDL less than 100 03/24/2021   Screening for cervical cancer 03/24/2021   Encounter for long-term (current) use of insulin  (HCC) 03/24/2021   Visit for screening mammogram 03/09/2021    ONSET DATE: referral date 06/07/24  REFERRING DIAG: M65.4 (ICD-10-CM) - De Quervain's tenosynovitis, bilateral  THERAPY DIAG:  Pain in right hand  Pain in left hand  Pain in right wrist  Pain in left wrist  Other lack of coordination  Rationale for Evaluation and Treatment: Rehabilitation  SUBJECTIVE:   SUBJECTIVE STATEMENT: Pt reporting that the steroid made her felt much better.  Pt reports that she was helping take care of her baby granddaughter for 6 months with onset of pain.  Pt reports still having some pain in R wrist with ulnar deviation and when opening wide to grasp something.   Pt accompanied by: self  PERTINENT HISTORY: Catherine Murphy has been experiencing bilateral radial sided wrist pain for the last 3-4 months.  This started after spending about 4 months in [place]  taking care of her granddaughter in which she was doing a lot of lifting and repetitive motions/lifting while caring for her. Underwent ultrasound-guided first dorsal compartment tendon sheath injection of R on 10/27 and of L on 11/7  PRECAUTIONS: None  WEIGHT BEARING RESTRICTIONS: No  PAIN:  Are you having pain? Yes: NPRS scale: 6-7 Pain location: R thumb  Pain description: sharp Aggravating factors: with flexion and ulnar deviation Relieving factors: avoiding painful movement  FALLS: Has patient fallen in last 6 months? No  LIVING ENVIRONMENT:  Lives with: lives with their spouse Lives in: House/apartment Stairs: No  PLOF: Independent and Independent with basic ADLs  PATIENT GOALS: to be able to use hands to pick up grandchild without pain  NEXT MD VISIT: as needed  OBJECTIVE:  Note: Objective measures were completed at Evaluation unless otherwise noted.  HAND DOMINANCE: Right  ADLs: WFL,  prior to steroid injections pt was having pain and difficulty with routine ADLs  FUNCTIONAL OUTCOME MEASURES: PSFS   UPPER EXTREMITY ROM:     Active ROM Right eval Left eval  Shoulder flexion    Shoulder abduction    Shoulder adduction    Shoulder extension    Shoulder internal rotation    Shoulder external rotation    Elbow flexion    Elbow extension    Wrist flexion (70-80)  64 60  Wrist extension (70-75) 60 64  Wrist ulnar deviation (30-35) 35 35  Wrist radial deviation    Wrist pronation    Wrist supination    (Blank rows = not tested)   HAND FUNCTION: Grip strength: Right: 30 lbs; Left: 36 lbs Norms: 34-60#  COORDINATION: 9 Hole Peg test: Right: 21.85 sec; Left: 17.25 sec - pt reporting pain in thumb when pinching to pick up pegs  SENSATION: WFL  EDEMA: WFL  COGNITION: Overall cognitive status: Within functional limits for tasks assessed   TREATMENT DATE:  06/20/24 Engaged in discussion about recommendation to fabricate a custom splint for R and potentially L hand.  OT educating on recommended style and showing pictures, pt unsure about forearm aspect due to not wanting to limit her ROM but understanding of need to support beyond area of concern to provide best fit and support.   Joint protection: educated on recommendation to avoid activity with pinching with wrist in flexion or ulnar deviation. Massage: OT providing light massage along radial side of R forearm in circular and linear pattern while providing cues for technique and frequency.  Provided with written instructions (see pt instructions).       PATIENT EDUCATION: Education details: Educated on role and purpose of OT as well as potential interventions and goals for therapy based on initial evaluation findings. Person educated: Patient Education method: Explanation, Demonstration, Verbal cues, and Handouts Education comprehension: verbalized understanding and needs further education  HOME  EXERCISE PROGRAM: Massage - see pt instructions  GOALS: Goals reviewed with patient? Yes  SHORT TERM GOALS: Target date: 07/12/24  Pt will be independent in massage and gentle progressive ROM HEP to decrease pain. Baseline: new to OPOT Goal status: INITIAL  2.  Pt will verbalize understanding of task modifications and/or potential A/E needs to increase ease, safety, and independence w/ ADLs and IADLs. Baseline: difficulty with opening jars and/or containers with big open hand Goal status: INITIAL  3.  Pt will verbalize understanding of brace/splint options/recommendations for affected UE.  Baseline: custom orthosis to be fabricated Goal status: INITIAL  LONG TERM GOALS: Target date: 08/02/24  Patient will demonstrate at least 5 lb improvement in grip strength and 2# pinch strength as needed to open jars and other containers.  Baseline: R: 30#, L: 36# and pinch TBD Goal status: INITIAL  2.  Patient will demo improved FM coordination and endurance as evidenced by completing nine-hole peg with use of RUE in 19 seconds or less while maintaining consistent grasp pattern. Baseline: R: 21 sec, L: 17 sec Goal status: INITIAL  3.  Patient will demonstrate improvements in functional use of RUE as evidenced by improved  score on QuickDash to 12% impairment or less, indicating improved functional use of affected extremity.  Baseline: 18.2% Goal status: INITIAL  4.  Pt will report pain 2/10 or less with ADLs and IADLs. Baseline: 6-7/10 on eval Goal status: INITIAL   ASSESSMENT:  CLINICAL IMPRESSION: Patient is a 68 y.o. female who was seen today for occupational therapy evaluation for DeQuervain's tenosynovitis bilaterally R > L.  Pt did undergo ultrasound-guided first dorsal compartment tendon sheath injection in R and then L UE, improving pain significantly but pt still with pain in R > L with activity.  Patient currently presents below baseline level of functioning demonstrating  functional deficits and impairments as noted below. Pt would benefit from skilled OT services in the outpatient setting to work on impairments as noted below to help pt return to PLOF as able.     PERFORMANCE DEFICITS: in functional skills including ADLs, IADLs, coordination, ROM, strength, pain, Fine motor control, body mechanics, decreased knowledge of precautions, decreased knowledge of use of DME, and UE functional use and psychosocial skills including coping strategies, environmental adaptation, and routines and behaviors.   IMPAIRMENTS: are limiting patient from ADLs, IADLs, and leisure.   COMORBIDITIES: may have co-morbidities  that affects occupational performance. Patient will benefit from skilled OT to address above impairments and improve overall function.  MODIFICATION OR ASSISTANCE TO COMPLETE EVALUATION: No modification of tasks or assist necessary to complete an evaluation.  OT OCCUPATIONAL PROFILE AND HISTORY: Problem focused assessment: Including review of records relating to presenting problem.  CLINICAL DECISION MAKING: LOW - limited treatment options, no task modification necessary  REHAB POTENTIAL: Good  EVALUATION COMPLEXITY: Low      PLAN:  OT FREQUENCY: 1-2x/week  OT DURATION: 6 weeks  PLANNED INTERVENTIONS: 97168 OT Re-evaluation, 97535 self care/ADL training, 02889 therapeutic exercise, 97530 therapeutic activity, 97112 neuromuscular re-education, 97140 manual therapy, 97035 ultrasound, 97018 paraffin, 02960 fluidotherapy, 97760 Orthotic Initial, 97763 Orthotic/Prosthetic subsequent, passive range of motion, compression bandaging, psychosocial skills training, coping strategies training, patient/family education, and DME and/or AE instructions  RECOMMENDED OTHER SERVICES: NA  CONSULTED AND AGREED WITH PLAN OF CARE: Patient  PLAN FOR NEXT SESSION: Fabricate R forearm thumb spica splint, if time make L one as well.  Review massage and joint protection  strategies  1-2 weeks after splint fabrication initiate progressive AROM in HEP [id number]   Berdene Askari, OTR/L 06/20/2024, 3:07 PM  [facility] [address] [place], [address] [place], [place] [address] Phone # [phone] Fax # [phone]

## 2024-06-20 NOTE — Patient Instructions (Addendum)
 To massage: start by applying lotion, cocoa butter, or vitamin E oil to painful area along the radial side of the forearm, wrist, and thumb area. You will massage painful area in three directions: circles, side to side, and up and down.  Circles: First, use two fingertips to massage your painful area in a circular motion, starting at the top of the area and ending at the bottom. Massage in both a clockwise and counterclockwise direction. Side-to-side: Next, use two fingertips to massage across your painful area from side to side. Up and down: Finally, use two fingertips to massage up and down the length of the painful area.  Complete 2-3 times a day to promote circulation and healing to the underlying soft tissue structures.

## 2024-06-26 ENCOUNTER — Ambulatory Visit: Admitting: Occupational Therapy

## 2024-06-26 DIAGNOSIS — M25532 Pain in left wrist: Secondary | ICD-10-CM

## 2024-06-26 DIAGNOSIS — M79642 Pain in left hand: Secondary | ICD-10-CM

## 2024-06-26 DIAGNOSIS — M79641 Pain in right hand: Secondary | ICD-10-CM | POA: Diagnosis not present

## 2024-06-26 DIAGNOSIS — M25531 Pain in right wrist: Secondary | ICD-10-CM

## 2024-06-26 NOTE — Therapy (Signed)
 OUTPATIENT OCCUPATIONAL THERAPY ORTHO  Treatment Note   Patient Name: Catherine Murphy MRN: [id number] DOB:09-28-1955, 68 y.o., female Today's Date: 06/26/2024  PCP: Joshua Debby CROME, MD REFERRING PROVIDER: Burnetta Brunet, DO  END OF SESSION:  OT End of Session - 06/26/24 1611     Visit Number 2    Number of Visits 10    Date for Recertification  08/02/24    Authorization Type Medicare Part A&B / BCBS Supp 2025    OT Start Time 1453    OT Stop Time 1605    OT Time Calculation (min) 72 min           Past Medical History:  Diagnosis Date   Diabetes mellitus without complication (HCC)    Past Surgical History:  Procedure Laterality Date   CATARACT EXTRACTION Right    COLONOSCOPY     TRIGGER FINGER RELEASE     Patient Active Problem List   Diagnosis Date Noted   Flexor carpi ulnaris tenosynovitis 05/14/2024   Radial styloid tenosynovitis of both hands 05/14/2024   Osteopenia after menopause 01/12/2024   Screening mammogram for breast cancer 08/08/2023   Asthma 08/07/2023   Hyperglycemia due to type 1 diabetes mellitus (HCC) 08/07/2023   Laryngopharyngeal reflux (LPR) 07/13/2023   Seasonal allergic rhinitis due to pollen 10/27/2022   Estrogen deficiency 10/27/2022   Insulin -requiring or dependent type II diabetes mellitus (HCC) 08/25/2022   Hypertension associated with diabetes (HCC) 08/17/2022   Chronic renal disease, stage 2, mildly decreased glomerular filtration rate (GFR) between 60-89 mL/min/1.73 square meter 07/29/2022   Microalbuminuria due to type 2 diabetes mellitus (HCC) 07/29/2022   DDD (degenerative disc disease), lumbar 07/29/2022   Chronic bilateral low back pain with bilateral sciatica 07/28/2022   Mild persistent asthma without complication 04/28/2022   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) 11/08/2021   Nuclear sclerotic cataract of left eye 11/08/2021   Pseudophakia, right eye 11/08/2021   Epiretinal membrane 09/28/2021    Hyperlipidemia with target LDL less than 100 03/24/2021   Screening for cervical cancer 03/24/2021   Encounter for long-term (current) use of insulin  (HCC) 03/24/2021   Visit for screening mammogram 03/09/2021    ONSET DATE: referral date 06/07/24  REFERRING DIAG: M65.4 (ICD-10-CM) - De Quervain's tenosynovitis, bilateral  THERAPY DIAG:  Pain in right hand  Pain in right wrist  Pain in left hand  Pain in left wrist  Rationale for Evaluation and Treatment: Rehabilitation  SUBJECTIVE:   SUBJECTIVE STATEMENT: Pt reporting that her R wrist and arm are feeling better.  Pt reporting that she was doing the massage which has decreased her pain.    Pt accompanied by: self  PERTINENT HISTORY: Catherine Murphy has been experiencing bilateral radial sided wrist pain for the last 3-4 months.  This started after spending about 4 months in [place]  taking care of her granddaughter in which she was doing a lot of lifting and repetitive motions/lifting while caring for her. Underwent ultrasound-guided first dorsal compartment tendon sheath injection of R on 10/27 and of L on 11/7  PRECAUTIONS: None  WEIGHT BEARING RESTRICTIONS: No  PAIN:  Are you having pain? Yes: NPRS scale: 6-7 Pain location: R thumb  Pain description: sharp Aggravating factors: with flexion and ulnar deviation Relieving factors: avoiding painful movement  FALLS: Has patient fallen in last 6 months? No  LIVING ENVIRONMENT: Lives with: lives with their spouse Lives in: House/apartment Stairs: No  PLOF: Independent and Independent with basic ADLs  PATIENT GOALS: to be able  to use hands to pick up grandchild without pain  NEXT MD VISIT: as needed  OBJECTIVE:  Note: Objective measures were completed at Evaluation unless otherwise noted.  HAND DOMINANCE: Right  ADLs: WFL, prior to steroid injections pt was having pain and difficulty with routine ADLs  FUNCTIONAL OUTCOME MEASURES: PSFS   UPPER EXTREMITY ROM:      Active ROM Right eval Left eval  Shoulder flexion    Shoulder abduction    Shoulder adduction    Shoulder extension    Shoulder internal rotation    Shoulder external rotation    Elbow flexion    Elbow extension    Wrist flexion (70-80)  64 60  Wrist extension (70-75) 60 64  Wrist ulnar deviation (30-35) 35 35  Wrist radial deviation    Wrist pronation    Wrist supination    (Blank rows = not tested)   HAND FUNCTION: Grip strength: Right: 30 lbs; Left: 36 lbs Norms: 34-60#  COORDINATION: 9 Hole Peg test: Right: 21.85 sec; Left: 17.25 sec - pt reporting pain in thumb when pinching to pick up pegs  SENSATION: WFL  EDEMA: WFL  COGNITION: Overall cognitive status: Within functional limits for tasks assessed   TREATMENT DATE:  06/26/24 Pt brought in original wrist brace that she purchased on [employer].  OT educating on purpose of more rigid splint to provide additional support and to decrease overuse.   Splint fabrication: applied stockinet over forearm. OT measured R forearm and hand in preparation for fabrication of forearm thumb spica splint for improved positioning and pain relief.  OT fabricated R orthosis with use of Orfit Multifit NS in 3.31mm 1/16".  OT fitting orthosis to pt's forearm and thumb with focus on slight wrist extension and ability to opposite thumb to index finger.  OT affixed strapping proximal and distal to wrist to allow for more secure fit and to maintain wrist in neutral position and across fingers at thumb to secure fit at thumb.   Self-care: OT educated pt on recommendations to gradually initiate wear and recommendations to assess fit and ensure no areas of irritation.  OT recommended assessment of skin after periodic wear and to note any redness, demarkation, or irritation and to report at next session.  Educated on increased wear as tolerated and to provide any feedback at next session to allow for modifications to resting hand orthosis as needed.   Educated on wear to facilitate rest for healing. Massage: reiterated light massage along radial side of R forearm, especially as pt reporting that she may have massaged too hard initially.    06/20/24 Engaged in discussion about recommendation to fabricate a custom splint for R and potentially L hand.  OT educating on recommended style and showing pictures, pt unsure about forearm aspect due to not wanting to limit her ROM but understanding of need to support beyond area of concern to provide best fit and support.   Joint protection: educated on recommendation to avoid activity with pinching with wrist in flexion or ulnar deviation. Massage: OT providing light massage along radial side of R forearm in circular and linear pattern while providing cues for technique and frequency.  Provided with written instructions (see pt instructions).       PATIENT EDUCATION: Education details: splint wear and care, massage and splinting to allow wrist/thumb to rest to facilitate increased healing Person educated: Patient Education method: Explanation, Demonstration, Verbal cues, and Handouts Education comprehension: verbalized understanding and needs further education  HOME EXERCISE PROGRAM:  Massage - see pt instructions  GOALS: Goals reviewed with patient? Yes  SHORT TERM GOALS: Target date: 07/12/24  Pt will be independent in massage and gentle progressive ROM HEP to decrease pain. Baseline: new to OPOT Goal status: in progress  2.  Pt will verbalize understanding of task modifications and/or potential A/E needs to increase ease, safety, and independence w/ ADLs and IADLs. Baseline: difficulty with opening jars and/or containers with big open hand Goal status: in progress  3.  Pt will verbalize understanding of brace/splint options/recommendations for affected UE.  Baseline: custom orthosis to be fabricated 06/26/24 - forearm thumb spica splint fabricated for R wrist Goal status: in  progress  LONG TERM GOALS: Target date: 08/02/24  Patient will demonstrate at least 5 lb improvement in grip strength and 2# pinch strength as needed to open jars and other containers.  Baseline: R: 30#, L: 36# and pinch TBD Goal status: in progress  2.  Patient will demo improved FM coordination and endurance as evidenced by completing nine-hole peg with use of RUE in 19 seconds or less while maintaining consistent grasp pattern. Baseline: R: 21 sec, L: 17 sec Goal status: in progress  3.  Patient will demonstrate improvements in functional use of RUE as evidenced by improved score on QuickDash to 12% impairment or less, indicating improved functional use of affected extremity.  Baseline: 18.2% Goal status: in progress  4.  Pt will report pain 2/10 or less with ADLs and IADLs. Baseline: 6-7/10 on eval Goal status: in progress   ASSESSMENT:  CLINICAL IMPRESSION: Patient is a 68 y.o. female who was seen today for occupational therapy treatment  for DeQuervain's tenosynovitis bilaterally R > L.  Pt tolerating fabrication of forearm based thumb spica splint this session.  Pt verbalizing no initial areas of irritation and verbalizing understanding of progressive wear to assess skin.  Pt reporting use of massage at home to aid in decreasing pain.  Patient currently presents below baseline level of functioning demonstrating functional deficits and impairments as noted below. Pt would benefit from skilled OT services in the outpatient setting to work on impairments as noted below to help pt return to PLOF as able.     PERFORMANCE DEFICITS: in functional skills including ADLs, IADLs, coordination, ROM, strength, pain, Fine motor control, body mechanics, decreased knowledge of precautions, decreased knowledge of use of DME, and UE functional use and psychosocial skills including coping strategies, environmental adaptation, and routines and behaviors.    PLAN:  OT FREQUENCY: 1-2x/week  OT  DURATION: 6 weeks  PLANNED INTERVENTIONS: 97168 OT Re-evaluation, 97535 self care/ADL training, 02889 therapeutic exercise, 97530 therapeutic activity, 97112 neuromuscular re-education, 97140 manual therapy, 97035 ultrasound, 97018 paraffin, 02960 fluidotherapy, 97760 Orthotic Initial, 97763 Orthotic/Prosthetic subsequent, passive range of motion, compression bandaging, psychosocial skills training, coping strategies training, patient/family education, and DME and/or AE instructions  RECOMMENDED OTHER SERVICES: NA  CONSULTED AND AGREED WITH PLAN OF CARE: Patient  PLAN FOR NEXT SESSION: Assess and modify R forearm thumb spica splint, PRN; if needed make L one as well  Review massage and joint protection strategies  1-2 weeks after splint fabrication initiate progressive AROM in HEP [id number]   Dynver Clemson, OTR/L 06/26/2024, 4:11 PM  [facility] [place] [address] [place], [place] [address] Phone # [phone] Fax # [phone]

## 2024-07-01 ENCOUNTER — Other Ambulatory Visit: Payer: Self-pay | Admitting: Internal Medicine

## 2024-07-01 DIAGNOSIS — Z1231 Encounter for screening mammogram for malignant neoplasm of breast: Secondary | ICD-10-CM

## 2024-07-03 ENCOUNTER — Other Ambulatory Visit: Payer: Self-pay | Admitting: Internal Medicine

## 2024-07-03 DIAGNOSIS — E1129 Type 2 diabetes mellitus with other diabetic kidney complication: Secondary | ICD-10-CM

## 2024-07-03 DIAGNOSIS — I10 Essential (primary) hypertension: Secondary | ICD-10-CM

## 2024-07-04 ENCOUNTER — Other Ambulatory Visit: Payer: Self-pay | Admitting: Internal Medicine

## 2024-07-04 DIAGNOSIS — J301 Allergic rhinitis due to pollen: Secondary | ICD-10-CM

## 2024-07-10 ENCOUNTER — Ambulatory Visit: Admitting: Occupational Therapy

## 2024-07-11 ENCOUNTER — Ambulatory Visit: Admitting: Internal Medicine

## 2024-07-17 ENCOUNTER — Ambulatory Visit

## 2024-07-23 ENCOUNTER — Ambulatory Visit: Admitting: Occupational Therapy

## 2024-07-30 ENCOUNTER — Ambulatory Visit: Admitting: Occupational Therapy

## 2024-08-05 ENCOUNTER — Other Ambulatory Visit: Payer: Self-pay | Admitting: Internal Medicine

## 2024-08-05 DIAGNOSIS — E1065 Type 1 diabetes mellitus with hyperglycemia: Secondary | ICD-10-CM

## 2024-08-06 ENCOUNTER — Other Ambulatory Visit: Payer: Self-pay | Admitting: Internal Medicine

## 2024-08-06 DIAGNOSIS — E1065 Type 1 diabetes mellitus with hyperglycemia: Secondary | ICD-10-CM

## 2024-08-06 NOTE — Telephone Encounter (Signed)
 Pt requesting Accu check meter and supplies

## 2024-08-06 NOTE — Telephone Encounter (Signed)
 Copied from CRM [id number]. Topic: Clinical - Medication Refill >> Aug 06, 2024  9:17 AM Burnard DEL wrote: Medication: Accu check Guide Meter Accu check lancets 30 mg Accu check test strips   NOVOLOG  100 UNIT/ML injection  Has the patient contacted their pharmacy? No (Agent: If no, request that the patient contact the pharmacy for the refill. If patient does not wish to contact the pharmacy document the reason why and proceed with request.) (Agent: If yes, when and what did the pharmacy advise?)  This is the patient's preferred pharmacy:  [id number] - [place], [place] - [address] AT [address] [facility] [address] [place] [address] Phone: [phone] Fax: [phone]  Is this the correct pharmacy for this prescription? Yes If no, delete pharmacy and type the correct one.   Has the prescription been filled recently? No  Is the patient out of the medication? Yes  Has the patient been seen for an appointment in the last year OR does the patient have an upcoming appointment? Yes  Can we respond through MyChart? Yes  Agent: Please be advised that Rx refills may take up to 3 business days. We ask that you follow-up with your pharmacy.

## 2024-08-14 ENCOUNTER — Ambulatory Visit: Admitting: Internal Medicine

## 2024-09-23 ENCOUNTER — Ambulatory Visit: Admitting: Internal Medicine

## 2024-09-25 ENCOUNTER — Ambulatory Visit

## 2025-05-01 ENCOUNTER — Ambulatory Visit
